# Patient Record
Sex: Female | Born: 1950 | Race: White | Hispanic: No | Marital: Married | Smoking: Never smoker
Health system: Southern US, Community
[De-identification: ages and names within clinical notes are randomized; demographics above are authoritative.]

## PROBLEM LIST (undated history)

## (undated) DIAGNOSIS — I509 Heart failure, unspecified: Secondary | ICD-10-CM

## (undated) DIAGNOSIS — I4891 Unspecified atrial fibrillation: Secondary | ICD-10-CM

## (undated) DIAGNOSIS — I872 Venous insufficiency (chronic) (peripheral): Secondary | ICD-10-CM

## (undated) DIAGNOSIS — M1990 Unspecified osteoarthritis, unspecified site: Secondary | ICD-10-CM

## (undated) DIAGNOSIS — K921 Melena: Secondary | ICD-10-CM

## (undated) DIAGNOSIS — I890 Lymphedema, not elsewhere classified: Secondary | ICD-10-CM

## (undated) DIAGNOSIS — G709 Myoneural disorder, unspecified: Secondary | ICD-10-CM

## (undated) DIAGNOSIS — D649 Anemia, unspecified: Secondary | ICD-10-CM

## (undated) DIAGNOSIS — K922 Gastrointestinal hemorrhage, unspecified: Secondary | ICD-10-CM

## (undated) DIAGNOSIS — E119 Type 2 diabetes mellitus without complications: Secondary | ICD-10-CM

## (undated) DIAGNOSIS — I10 Essential (primary) hypertension: Secondary | ICD-10-CM

## (undated) DIAGNOSIS — I499 Cardiac arrhythmia, unspecified: Secondary | ICD-10-CM

## (undated) DIAGNOSIS — G4730 Sleep apnea, unspecified: Secondary | ICD-10-CM

## (undated) HISTORY — PX: UMBILICAL HERNIA REPAIR: SHX196

## (undated) HISTORY — PX: TOTAL KNEE ARTHROPLASTY: SHX125

## (undated) HISTORY — PX: JOINT REPLACEMENT: SHX530

---

## 2022-03-18 ENCOUNTER — Inpatient Hospital Stay
Admission: RE | Admit: 2022-03-18 | Discharge: 2022-04-07 | Disposition: A | Discharge status: Rehabilitation Facility | Payer: Medicare Other

## 2022-03-18 HISTORY — DX: Lymphedema, not elsewhere classified: I89.0

## 2022-03-18 HISTORY — DX: Gastrointestinal hemorrhage, unspecified: K92.2

## 2022-03-18 HISTORY — DX: Unspecified atrial fibrillation: I48.91

## 2022-03-18 HISTORY — DX: Melena: K92.1

## 2022-03-18 HISTORY — DX: Venous insufficiency (chronic) (peripheral): I87.2

## 2022-03-18 HISTORY — DX: Morbid (severe) obesity due to excess calories: E66.01

## 2022-03-19 LAB — BASIC METABOLIC PANEL
Anion gap: 8 (ref 5–15)
BUN: 20 mg/dL (ref 8–23)
CO2: 43 mmol/L — ABNORMAL HIGH (ref 22–32)
Calcium: 9.4 mg/dL (ref 8.9–10.3)
Chloride: 82 mmol/L — ABNORMAL LOW (ref 98–111)
Creatinine, Ser: 1.04 mg/dL — ABNORMAL HIGH (ref 0.44–1.00)
GFR, Estimated: 57 mL/min — ABNORMAL LOW (ref >60)
Glucose, Bld: 95 mg/dL (ref 70–99)
Potassium: 3.6 mmol/L (ref 3.5–5.1)
Sodium: 133 mmol/L — ABNORMAL LOW (ref 135–145)

## 2022-03-19 LAB — CBC WITH DIFFERENTIAL/PLATELET
Abs Immature Granulocytes: 0.06 10*3/uL (ref 0.00–0.07)
Basophils Absolute: 0.1 10*3/uL (ref 0.0–0.1)
Basophils Relative: 1 %
Eosinophils Absolute: 0.3 10*3/uL (ref 0.0–0.5)
Eosinophils Relative: 2 %
HCT: 25.3 % — ABNORMAL LOW (ref 36.0–46.0)
Hemoglobin: 7.7 g/dL — ABNORMAL LOW (ref 12.0–15.0)
Immature Granulocytes: 1 %
Lymphocytes Relative: 10 %
Lymphs Abs: 1.1 10*3/uL (ref 0.7–4.0)
MCH: 26.1 pg (ref 26.0–34.0)
MCHC: 30.4 g/dL (ref 30.0–36.0)
MCV: 85.8 fL (ref 80.0–100.0)
Monocytes Absolute: 1 10*3/uL (ref 0.1–1.0)
Monocytes Relative: 9 %
Neutro Abs: 8.2 10*3/uL — ABNORMAL HIGH (ref 1.7–7.7)
Neutrophils Relative %: 77 %
Platelets: 450 10*3/uL — ABNORMAL HIGH (ref 150–400)
RBC: 2.95 MIL/uL — ABNORMAL LOW (ref 3.87–5.11)
RDW: 16.7 % — ABNORMAL HIGH (ref 11.5–15.5)
WBC: 10.6 10*3/uL — ABNORMAL HIGH (ref 4.0–10.5)
nRBC: 0 % (ref 0.0–0.2)

## 2022-03-19 LAB — LIPID PANEL
Cholesterol: 110 mg/dL (ref 0–200)
HDL: 28 mg/dL — ABNORMAL LOW (ref >40)
LDL Cholesterol: 67 mg/dL (ref 0–99)
Total CHOL/HDL Ratio: 3.9 RATIO
Triglycerides: 75 mg/dL (ref <150)
VLDL: 15 mg/dL (ref 0–40)

## 2022-03-19 LAB — HEMOGLOBIN A1C
Hgb A1c MFr Bld: 5.4 % (ref 4.8–5.6)
Mean Plasma Glucose: 108.28 mg/dL

## 2022-03-19 LAB — CK: Total CK: 540 U/L — ABNORMAL HIGH (ref 38–234)

## 2022-03-19 LAB — MAGNESIUM: Magnesium: 1.9 mg/dL (ref 1.7–2.4)

## 2022-03-19 LAB — TSH: TSH: 4.495 u[IU]/mL (ref 0.350–4.500)

## 2022-03-20 LAB — CBC WITH DIFFERENTIAL/PLATELET
Abs Immature Granulocytes: 0.06 10*3/uL (ref 0.00–0.07)
Basophils Absolute: 0.1 10*3/uL (ref 0.0–0.1)
Basophils Relative: 1 %
Eosinophils Absolute: 0.3 10*3/uL (ref 0.0–0.5)
Eosinophils Relative: 3 %
HCT: 23.1 % — ABNORMAL LOW (ref 36.0–46.0)
Hemoglobin: 7.1 g/dL — ABNORMAL LOW (ref 12.0–15.0)
Immature Granulocytes: 1 %
Lymphocytes Relative: 12 %
Lymphs Abs: 1.2 10*3/uL (ref 0.7–4.0)
MCH: 26.9 pg (ref 26.0–34.0)
MCHC: 30.7 g/dL (ref 30.0–36.0)
MCV: 87.5 fL (ref 80.0–100.0)
Monocytes Absolute: 1 10*3/uL (ref 0.1–1.0)
Monocytes Relative: 9 %
Neutro Abs: 7.5 10*3/uL (ref 1.7–7.7)
Neutrophils Relative %: 74 %
Platelets: 407 10*3/uL — ABNORMAL HIGH (ref 150–400)
RBC: 2.64 MIL/uL — ABNORMAL LOW (ref 3.87–5.11)
RDW: 16.9 % — ABNORMAL HIGH (ref 11.5–15.5)
WBC: 10.2 10*3/uL (ref 4.0–10.5)
nRBC: 0 % (ref 0.0–0.2)

## 2022-03-20 LAB — BASIC METABOLIC PANEL
Anion gap: 6 (ref 5–15)
BUN: 28 mg/dL — ABNORMAL HIGH (ref 8–23)
CO2: 43 mmol/L — ABNORMAL HIGH (ref 22–32)
Calcium: 9.1 mg/dL (ref 8.9–10.3)
Chloride: 87 mmol/L — ABNORMAL LOW (ref 98–111)
Creatinine, Ser: 1.13 mg/dL — ABNORMAL HIGH (ref 0.44–1.00)
GFR, Estimated: 51 mL/min — ABNORMAL LOW (ref >60)
Glucose, Bld: 102 mg/dL — ABNORMAL HIGH (ref 70–99)
Potassium: 4 mmol/L (ref 3.5–5.1)
Sodium: 136 mmol/L (ref 135–145)

## 2022-03-20 LAB — HEMOGLOBIN AND HEMATOCRIT, BLOOD
HCT: 27.9 % — ABNORMAL LOW (ref 36.0–46.0)
Hemoglobin: 9 g/dL — ABNORMAL LOW (ref 12.0–15.0)

## 2022-03-20 LAB — PREPARE RBC (CROSSMATCH)

## 2022-03-20 LAB — MAGNESIUM: Magnesium: 2 mg/dL (ref 1.7–2.4)

## 2022-03-20 LAB — ABO/RH: ABO/RH(D): A POS

## 2022-03-21 LAB — CBC WITH DIFFERENTIAL/PLATELET
Abs Immature Granulocytes: 0.04 10*3/uL (ref 0.00–0.07)
Basophils Absolute: 0.1 10*3/uL (ref 0.0–0.1)
Basophils Relative: 1 %
Eosinophils Absolute: 0.2 10*3/uL (ref 0.0–0.5)
Eosinophils Relative: 3 %
HCT: 25.9 % — ABNORMAL LOW (ref 36.0–46.0)
Hemoglobin: 7.9 g/dL — ABNORMAL LOW (ref 12.0–15.0)
Immature Granulocytes: 0 %
Lymphocytes Relative: 11 %
Lymphs Abs: 1 10*3/uL (ref 0.7–4.0)
MCH: 26.6 pg (ref 26.0–34.0)
MCHC: 30.5 g/dL (ref 30.0–36.0)
MCV: 87.2 fL (ref 80.0–100.0)
Monocytes Absolute: 0.8 10*3/uL (ref 0.1–1.0)
Monocytes Relative: 8 %
Neutro Abs: 7.3 10*3/uL (ref 1.7–7.7)
Neutrophils Relative %: 77 %
Platelets: 412 10*3/uL — ABNORMAL HIGH (ref 150–400)
RBC: 2.97 MIL/uL — ABNORMAL LOW (ref 3.87–5.11)
RDW: 17.1 % — ABNORMAL HIGH (ref 11.5–15.5)
WBC: 9.4 10*3/uL (ref 4.0–10.5)
nRBC: 0 % (ref 0.0–0.2)

## 2022-03-21 LAB — BASIC METABOLIC PANEL
Anion gap: 9 (ref 5–15)
BUN: 25 mg/dL — ABNORMAL HIGH (ref 8–23)
CO2: 44 mmol/L — ABNORMAL HIGH (ref 22–32)
Calcium: 9.3 mg/dL (ref 8.9–10.3)
Chloride: 85 mmol/L — ABNORMAL LOW (ref 98–111)
Creatinine, Ser: 1.11 mg/dL — ABNORMAL HIGH (ref 0.44–1.00)
GFR, Estimated: 52 mL/min — ABNORMAL LOW (ref >60)
Glucose, Bld: 99 mg/dL (ref 70–99)
Potassium: 3.7 mmol/L (ref 3.5–5.1)
Sodium: 138 mmol/L (ref 135–145)

## 2022-03-21 LAB — TYPE AND SCREEN
ABO/RH(D): A POS
Antibody Screen: NEGATIVE
Unit division: 0

## 2022-03-21 LAB — BPAM RBC
Blood Product Expiration Date: 202305022359
ISSUE DATE / TIME: 202304011700
Unit Type and Rh: 6200

## 2022-03-21 LAB — MAGNESIUM: Magnesium: 2 mg/dL (ref 1.7–2.4)

## 2022-03-22 LAB — BASIC METABOLIC PANEL
BUN: 30 mg/dL — ABNORMAL HIGH (ref 8–23)
CO2: >45 mmol/L — ABNORMAL HIGH (ref 22–32)
Calcium: 9.3 mg/dL (ref 8.9–10.3)
Chloride: 87 mmol/L — ABNORMAL LOW (ref 98–111)
Creatinine, Ser: 1.43 mg/dL — ABNORMAL HIGH (ref 0.44–1.00)
GFR, Estimated: 39 mL/min — ABNORMAL LOW (ref >60)
Glucose, Bld: 96 mg/dL (ref 70–99)
Potassium: 4.2 mmol/L (ref 3.5–5.1)
Sodium: 139 mmol/L (ref 135–145)

## 2022-03-22 LAB — CBC WITH DIFFERENTIAL/PLATELET
Abs Immature Granulocytes: 0.05 10*3/uL (ref 0.00–0.07)
Basophils Absolute: 0.1 10*3/uL (ref 0.0–0.1)
Basophils Relative: 1 %
Eosinophils Absolute: 0.4 10*3/uL (ref 0.0–0.5)
Eosinophils Relative: 4 %
HCT: 28 % — ABNORMAL LOW (ref 36.0–46.0)
Hemoglobin: 8.3 g/dL — ABNORMAL LOW (ref 12.0–15.0)
Immature Granulocytes: 1 %
Lymphocytes Relative: 16 %
Lymphs Abs: 1.5 10*3/uL (ref 0.7–4.0)
MCH: 26.3 pg (ref 26.0–34.0)
MCHC: 29.6 g/dL — ABNORMAL LOW (ref 30.0–36.0)
MCV: 88.6 fL (ref 80.0–100.0)
Monocytes Absolute: 0.9 10*3/uL (ref 0.1–1.0)
Monocytes Relative: 10 %
Neutro Abs: 6.1 10*3/uL (ref 1.7–7.7)
Neutrophils Relative %: 68 %
Platelets: 382 10*3/uL (ref 150–400)
RBC: 3.16 MIL/uL — ABNORMAL LOW (ref 3.87–5.11)
RDW: 16.7 % — ABNORMAL HIGH (ref 11.5–15.5)
WBC: 9 10*3/uL (ref 4.0–10.5)
nRBC: 0 % (ref 0.0–0.2)

## 2022-03-22 LAB — MAGNESIUM: Magnesium: 2.2 mg/dL (ref 1.7–2.4)

## 2022-03-23 LAB — CBC WITH DIFFERENTIAL/PLATELET
Abs Immature Granulocytes: 0.05 10*3/uL (ref 0.00–0.07)
Basophils Absolute: 0.1 10*3/uL (ref 0.0–0.1)
Basophils Relative: 1 %
Eosinophils Absolute: 0.3 10*3/uL (ref 0.0–0.5)
Eosinophils Relative: 3 %
HCT: 27.2 % — ABNORMAL LOW (ref 36.0–46.0)
Hemoglobin: 7.9 g/dL — ABNORMAL LOW (ref 12.0–15.0)
Immature Granulocytes: 1 %
Lymphocytes Relative: 11 %
Lymphs Abs: 1.1 10*3/uL (ref 0.7–4.0)
MCH: 25.9 pg — ABNORMAL LOW (ref 26.0–34.0)
MCHC: 29 g/dL — ABNORMAL LOW (ref 30.0–36.0)
MCV: 89.2 fL (ref 80.0–100.0)
Monocytes Absolute: 0.9 10*3/uL (ref 0.1–1.0)
Monocytes Relative: 8 %
Neutro Abs: 8.1 10*3/uL — ABNORMAL HIGH (ref 1.7–7.7)
Neutrophils Relative %: 76 %
Platelets: 350 10*3/uL (ref 150–400)
RBC: 3.05 MIL/uL — ABNORMAL LOW (ref 3.87–5.11)
RDW: 16.8 % — ABNORMAL HIGH (ref 11.5–15.5)
WBC: 10.5 10*3/uL (ref 4.0–10.5)
nRBC: 0 % (ref 0.0–0.2)

## 2022-03-23 LAB — MAGNESIUM: Magnesium: 2 mg/dL (ref 1.7–2.4)

## 2022-03-23 LAB — BASIC METABOLIC PANEL
Anion gap: 8 (ref 5–15)
BUN: 37 mg/dL — ABNORMAL HIGH (ref 8–23)
CO2: 45 mmol/L — ABNORMAL HIGH (ref 22–32)
Calcium: 9.2 mg/dL (ref 8.9–10.3)
Chloride: 90 mmol/L — ABNORMAL LOW (ref 98–111)
Creatinine, Ser: 1.28 mg/dL — ABNORMAL HIGH (ref 0.44–1.00)
GFR, Estimated: 44 mL/min — ABNORMAL LOW (ref >60)
Glucose, Bld: 132 mg/dL — ABNORMAL HIGH (ref 70–99)
Potassium: 3.6 mmol/L (ref 3.5–5.1)
Sodium: 143 mmol/L (ref 135–145)

## 2022-03-24 LAB — CBC WITH DIFFERENTIAL/PLATELET
Abs Immature Granulocytes: 0.03 10*3/uL (ref 0.00–0.07)
Basophils Absolute: 0.1 10*3/uL (ref 0.0–0.1)
Basophils Relative: 1 %
Eosinophils Absolute: 0.3 10*3/uL (ref 0.0–0.5)
Eosinophils Relative: 3 %
HCT: 27.6 % — ABNORMAL LOW (ref 36.0–46.0)
Hemoglobin: 7.9 g/dL — ABNORMAL LOW (ref 12.0–15.0)
Immature Granulocytes: 0 %
Lymphocytes Relative: 11 %
Lymphs Abs: 1.1 10*3/uL (ref 0.7–4.0)
MCH: 25.6 pg — ABNORMAL LOW (ref 26.0–34.0)
MCHC: 28.6 g/dL — ABNORMAL LOW (ref 30.0–36.0)
MCV: 89.6 fL (ref 80.0–100.0)
Monocytes Absolute: 0.7 10*3/uL (ref 0.1–1.0)
Monocytes Relative: 8 %
Neutro Abs: 7.6 10*3/uL (ref 1.7–7.7)
Neutrophils Relative %: 77 %
Platelets: 330 10*3/uL (ref 150–400)
RBC: 3.08 MIL/uL — ABNORMAL LOW (ref 3.87–5.11)
RDW: 16.8 % — ABNORMAL HIGH (ref 11.5–15.5)
WBC: 9.9 10*3/uL (ref 4.0–10.5)
nRBC: 0 % (ref 0.0–0.2)

## 2022-03-24 LAB — BASIC METABOLIC PANEL
Anion gap: 5 (ref 5–15)
BUN: 35 mg/dL — ABNORMAL HIGH (ref 8–23)
CO2: 43 mmol/L — ABNORMAL HIGH (ref 22–32)
Calcium: 9.3 mg/dL (ref 8.9–10.3)
Chloride: 93 mmol/L — ABNORMAL LOW (ref 98–111)
Creatinine, Ser: 1.13 mg/dL — ABNORMAL HIGH (ref 0.44–1.00)
GFR, Estimated: 51 mL/min — ABNORMAL LOW (ref >60)
Glucose, Bld: 102 mg/dL — ABNORMAL HIGH (ref 70–99)
Potassium: 3.9 mmol/L (ref 3.5–5.1)
Sodium: 141 mmol/L (ref 135–145)

## 2022-03-24 LAB — MAGNESIUM: Magnesium: 2.2 mg/dL (ref 1.7–2.4)

## 2022-03-25 LAB — CBC WITH DIFFERENTIAL/PLATELET
Abs Immature Granulocytes: 0.02 10*3/uL (ref 0.00–0.07)
Basophils Absolute: 0.1 10*3/uL (ref 0.0–0.1)
Basophils Relative: 1 %
Eosinophils Absolute: 0.5 10*3/uL (ref 0.0–0.5)
Eosinophils Relative: 6 %
HCT: 27.6 % — ABNORMAL LOW (ref 36.0–46.0)
Hemoglobin: 8.1 g/dL — ABNORMAL LOW (ref 12.0–15.0)
Immature Granulocytes: 0 %
Lymphocytes Relative: 12 %
Lymphs Abs: 1.1 10*3/uL (ref 0.7–4.0)
MCH: 26.2 pg (ref 26.0–34.0)
MCHC: 29.3 g/dL — ABNORMAL LOW (ref 30.0–36.0)
MCV: 89.3 fL (ref 80.0–100.0)
Monocytes Absolute: 0.6 10*3/uL (ref 0.1–1.0)
Monocytes Relative: 7 %
Neutro Abs: 6.7 10*3/uL (ref 1.7–7.7)
Neutrophils Relative %: 74 %
Platelets: 348 10*3/uL (ref 150–400)
RBC: 3.09 MIL/uL — ABNORMAL LOW (ref 3.87–5.11)
RDW: 16.8 % — ABNORMAL HIGH (ref 11.5–15.5)
WBC: 9.1 10*3/uL (ref 4.0–10.5)
nRBC: 0 % (ref 0.0–0.2)

## 2022-03-25 LAB — BASIC METABOLIC PANEL
Anion gap: 9 (ref 5–15)
BUN: 33 mg/dL — ABNORMAL HIGH (ref 8–23)
CO2: 38 mmol/L — ABNORMAL HIGH (ref 22–32)
Calcium: 9 mg/dL (ref 8.9–10.3)
Chloride: 93 mmol/L — ABNORMAL LOW (ref 98–111)
Creatinine, Ser: 1.07 mg/dL — ABNORMAL HIGH (ref 0.44–1.00)
GFR, Estimated: 55 mL/min — ABNORMAL LOW (ref >60)
Glucose, Bld: 99 mg/dL (ref 70–99)
Potassium: 3.8 mmol/L (ref 3.5–5.1)
Sodium: 140 mmol/L (ref 135–145)

## 2022-03-25 LAB — MAGNESIUM: Magnesium: 2.3 mg/dL (ref 1.7–2.4)

## 2022-03-26 LAB — CBC WITH DIFFERENTIAL/PLATELET
Abs Immature Granulocytes: 0.05 10*3/uL (ref 0.00–0.07)
Basophils Absolute: 0.1 10*3/uL (ref 0.0–0.1)
Basophils Relative: 1 %
Eosinophils Absolute: 0.5 10*3/uL (ref 0.0–0.5)
Eosinophils Relative: 5 %
HCT: 27.9 % — ABNORMAL LOW (ref 36.0–46.0)
Hemoglobin: 8.1 g/dL — ABNORMAL LOW (ref 12.0–15.0)
Immature Granulocytes: 1 %
Lymphocytes Relative: 12 %
Lymphs Abs: 1.1 10*3/uL (ref 0.7–4.0)
MCH: 26 pg (ref 26.0–34.0)
MCHC: 29 g/dL — ABNORMAL LOW (ref 30.0–36.0)
MCV: 89.4 fL (ref 80.0–100.0)
Monocytes Absolute: 0.7 10*3/uL (ref 0.1–1.0)
Monocytes Relative: 7 %
Neutro Abs: 6.6 10*3/uL (ref 1.7–7.7)
Neutrophils Relative %: 74 %
Platelets: 359 10*3/uL (ref 150–400)
RBC: 3.12 MIL/uL — ABNORMAL LOW (ref 3.87–5.11)
RDW: 16.9 % — ABNORMAL HIGH (ref 11.5–15.5)
WBC: 9 10*3/uL (ref 4.0–10.5)
nRBC: 0 % (ref 0.0–0.2)

## 2022-03-26 LAB — BASIC METABOLIC PANEL
Anion gap: 5 (ref 5–15)
BUN: 37 mg/dL — ABNORMAL HIGH (ref 8–23)
CO2: 40 mmol/L — ABNORMAL HIGH (ref 22–32)
Calcium: 9.1 mg/dL (ref 8.9–10.3)
Chloride: 98 mmol/L (ref 98–111)
Creatinine, Ser: 1.15 mg/dL — ABNORMAL HIGH (ref 0.44–1.00)
GFR, Estimated: 50 mL/min — ABNORMAL LOW (ref >60)
Glucose, Bld: 99 mg/dL (ref 70–99)
Potassium: 3.7 mmol/L (ref 3.5–5.1)
Sodium: 143 mmol/L (ref 135–145)

## 2022-03-26 LAB — MAGNESIUM: Magnesium: 2.3 mg/dL (ref 1.7–2.4)

## 2022-03-27 LAB — CBC WITH DIFFERENTIAL/PLATELET
Abs Immature Granulocytes: 0.05 10*3/uL (ref 0.00–0.07)
Basophils Absolute: 0.1 10*3/uL (ref 0.0–0.1)
Basophils Relative: 2 %
Eosinophils Absolute: 0.6 10*3/uL — ABNORMAL HIGH (ref 0.0–0.5)
Eosinophils Relative: 7 %
HCT: 27.3 % — ABNORMAL LOW (ref 36.0–46.0)
Hemoglobin: 7.9 g/dL — ABNORMAL LOW (ref 12.0–15.0)
Immature Granulocytes: 1 %
Lymphocytes Relative: 14 %
Lymphs Abs: 1.2 10*3/uL (ref 0.7–4.0)
MCH: 26.2 pg (ref 26.0–34.0)
MCHC: 28.9 g/dL — ABNORMAL LOW (ref 30.0–36.0)
MCV: 90.7 fL (ref 80.0–100.0)
Monocytes Absolute: 0.7 10*3/uL (ref 0.1–1.0)
Monocytes Relative: 8 %
Neutro Abs: 5.7 10*3/uL (ref 1.7–7.7)
Neutrophils Relative %: 68 %
Platelets: 348 10*3/uL (ref 150–400)
RBC: 3.01 MIL/uL — ABNORMAL LOW (ref 3.87–5.11)
RDW: 17.1 % — ABNORMAL HIGH (ref 11.5–15.5)
WBC: 8.3 10*3/uL (ref 4.0–10.5)
nRBC: 0 % (ref 0.0–0.2)

## 2022-03-27 LAB — BASIC METABOLIC PANEL
Anion gap: 5 (ref 5–15)
BUN: 35 mg/dL — ABNORMAL HIGH (ref 8–23)
CO2: 37 mmol/L — ABNORMAL HIGH (ref 22–32)
Calcium: 8.8 mg/dL — ABNORMAL LOW (ref 8.9–10.3)
Chloride: 100 mmol/L (ref 98–111)
Creatinine, Ser: 1.12 mg/dL — ABNORMAL HIGH (ref 0.44–1.00)
GFR, Estimated: 52 mL/min — ABNORMAL LOW (ref >60)
Glucose, Bld: 95 mg/dL (ref 70–99)
Potassium: 3.7 mmol/L (ref 3.5–5.1)
Sodium: 142 mmol/L (ref 135–145)

## 2022-03-27 LAB — MAGNESIUM: Magnesium: 2.4 mg/dL (ref 1.7–2.4)

## 2022-03-28 LAB — CBC WITH DIFFERENTIAL/PLATELET
Abs Immature Granulocytes: 0.06 10*3/uL (ref 0.00–0.07)
Basophils Absolute: 0.1 10*3/uL (ref 0.0–0.1)
Basophils Relative: 1 %
Eosinophils Absolute: 0.4 10*3/uL (ref 0.0–0.5)
Eosinophils Relative: 4 %
HCT: 27.3 % — ABNORMAL LOW (ref 36.0–46.0)
Hemoglobin: 8.2 g/dL — ABNORMAL LOW (ref 12.0–15.0)
Immature Granulocytes: 1 %
Lymphocytes Relative: 10 %
Lymphs Abs: 1 10*3/uL (ref 0.7–4.0)
MCH: 26.8 pg (ref 26.0–34.0)
MCHC: 30 g/dL (ref 30.0–36.0)
MCV: 89.2 fL (ref 80.0–100.0)
Monocytes Absolute: 0.7 10*3/uL (ref 0.1–1.0)
Monocytes Relative: 7 %
Neutro Abs: 7.6 10*3/uL (ref 1.7–7.7)
Neutrophils Relative %: 77 %
Platelets: 358 10*3/uL (ref 150–400)
RBC: 3.06 MIL/uL — ABNORMAL LOW (ref 3.87–5.11)
RDW: 16.8 % — ABNORMAL HIGH (ref 11.5–15.5)
WBC: 9.8 10*3/uL (ref 4.0–10.5)
nRBC: 0 % (ref 0.0–0.2)

## 2022-03-28 LAB — BASIC METABOLIC PANEL
Anion gap: 7 (ref 5–15)
BUN: 32 mg/dL — ABNORMAL HIGH (ref 8–23)
CO2: 35 mmol/L — ABNORMAL HIGH (ref 22–32)
Calcium: 8.8 mg/dL — ABNORMAL LOW (ref 8.9–10.3)
Chloride: 98 mmol/L (ref 98–111)
Creatinine, Ser: 1.1 mg/dL — ABNORMAL HIGH (ref 0.44–1.00)
GFR, Estimated: 53 mL/min — ABNORMAL LOW (ref >60)
Glucose, Bld: 100 mg/dL — ABNORMAL HIGH (ref 70–99)
Potassium: 3.6 mmol/L (ref 3.5–5.1)
Sodium: 140 mmol/L (ref 135–145)

## 2022-03-28 LAB — TSH: TSH: 3.146 u[IU]/mL (ref 0.350–4.500)

## 2022-03-28 LAB — MAGNESIUM: Magnesium: 2.3 mg/dL (ref 1.7–2.4)

## 2022-03-29 LAB — CBC WITH DIFFERENTIAL/PLATELET
Abs Immature Granulocytes: 0.04 10*3/uL (ref 0.00–0.07)
Basophils Absolute: 0.1 10*3/uL (ref 0.0–0.1)
Basophils Relative: 1 %
Eosinophils Absolute: 0.4 10*3/uL (ref 0.0–0.5)
Eosinophils Relative: 4 %
HCT: 27.5 % — ABNORMAL LOW (ref 36.0–46.0)
Hemoglobin: 8 g/dL — ABNORMAL LOW (ref 12.0–15.0)
Immature Granulocytes: 0 %
Lymphocytes Relative: 10 %
Lymphs Abs: 0.9 10*3/uL (ref 0.7–4.0)
MCH: 26.3 pg (ref 26.0–34.0)
MCHC: 29.1 g/dL — ABNORMAL LOW (ref 30.0–36.0)
MCV: 90.5 fL (ref 80.0–100.0)
Monocytes Absolute: 0.6 10*3/uL (ref 0.1–1.0)
Monocytes Relative: 7 %
Neutro Abs: 7 10*3/uL (ref 1.7–7.7)
Neutrophils Relative %: 78 %
Platelets: 374 10*3/uL (ref 150–400)
RBC: 3.04 MIL/uL — ABNORMAL LOW (ref 3.87–5.11)
RDW: 16.9 % — ABNORMAL HIGH (ref 11.5–15.5)
WBC: 9 10*3/uL (ref 4.0–10.5)
nRBC: 0 % (ref 0.0–0.2)

## 2022-03-29 LAB — BASIC METABOLIC PANEL
Anion gap: 5 (ref 5–15)
BUN: 30 mg/dL — ABNORMAL HIGH (ref 8–23)
CO2: 36 mmol/L — ABNORMAL HIGH (ref 22–32)
Calcium: 9 mg/dL (ref 8.9–10.3)
Chloride: 100 mmol/L (ref 98–111)
Creatinine, Ser: 1.09 mg/dL — ABNORMAL HIGH (ref 0.44–1.00)
GFR, Estimated: 54 mL/min — ABNORMAL LOW (ref >60)
Glucose, Bld: 94 mg/dL (ref 70–99)
Potassium: 4.3 mmol/L (ref 3.5–5.1)
Sodium: 141 mmol/L (ref 135–145)

## 2022-03-29 LAB — MAGNESIUM: Magnesium: 2.4 mg/dL (ref 1.7–2.4)

## 2022-03-30 LAB — BASIC METABOLIC PANEL
Anion gap: 6 (ref 5–15)
BUN: 30 mg/dL — ABNORMAL HIGH (ref 8–23)
CO2: 36 mmol/L — ABNORMAL HIGH (ref 22–32)
Calcium: 8.9 mg/dL (ref 8.9–10.3)
Chloride: 98 mmol/L (ref 98–111)
Creatinine, Ser: 1.03 mg/dL — ABNORMAL HIGH (ref 0.44–1.00)
GFR, Estimated: 57 mL/min — ABNORMAL LOW (ref >60)
Glucose, Bld: 105 mg/dL — ABNORMAL HIGH (ref 70–99)
Potassium: 3.8 mmol/L (ref 3.5–5.1)
Sodium: 140 mmol/L (ref 135–145)

## 2022-03-30 LAB — CBC WITH DIFFERENTIAL/PLATELET
Abs Immature Granulocytes: 0.05 10*3/uL (ref 0.00–0.07)
Basophils Absolute: 0.1 10*3/uL (ref 0.0–0.1)
Basophils Relative: 1 %
Eosinophils Absolute: 0.4 10*3/uL (ref 0.0–0.5)
Eosinophils Relative: 5 %
HCT: 27.4 % — ABNORMAL LOW (ref 36.0–46.0)
Hemoglobin: 8.2 g/dL — ABNORMAL LOW (ref 12.0–15.0)
Immature Granulocytes: 1 %
Lymphocytes Relative: 12 %
Lymphs Abs: 1.1 10*3/uL (ref 0.7–4.0)
MCH: 26.8 pg (ref 26.0–34.0)
MCHC: 29.9 g/dL — ABNORMAL LOW (ref 30.0–36.0)
MCV: 89.5 fL (ref 80.0–100.0)
Monocytes Absolute: 0.7 10*3/uL (ref 0.1–1.0)
Monocytes Relative: 8 %
Neutro Abs: 6.6 10*3/uL (ref 1.7–7.7)
Neutrophils Relative %: 73 %
Platelets: 385 10*3/uL (ref 150–400)
RBC: 3.06 MIL/uL — ABNORMAL LOW (ref 3.87–5.11)
RDW: 16.6 % — ABNORMAL HIGH (ref 11.5–15.5)
WBC: 8.9 10*3/uL (ref 4.0–10.5)
nRBC: 0 % (ref 0.0–0.2)

## 2022-03-30 LAB — MAGNESIUM: Magnesium: 2.3 mg/dL (ref 1.7–2.4)

## 2022-03-31 LAB — CBC WITH DIFFERENTIAL/PLATELET
Abs Immature Granulocytes: 0.03 10*3/uL (ref 0.00–0.07)
Basophils Absolute: 0.1 10*3/uL (ref 0.0–0.1)
Basophils Relative: 1 %
Eosinophils Absolute: 0.4 10*3/uL (ref 0.0–0.5)
Eosinophils Relative: 5 %
HCT: 27.9 % — ABNORMAL LOW (ref 36.0–46.0)
Hemoglobin: 8.4 g/dL — ABNORMAL LOW (ref 12.0–15.0)
Immature Granulocytes: 0 %
Lymphocytes Relative: 12 %
Lymphs Abs: 1 10*3/uL (ref 0.7–4.0)
MCH: 26.7 pg (ref 26.0–34.0)
MCHC: 30.1 g/dL (ref 30.0–36.0)
MCV: 88.6 fL (ref 80.0–100.0)
Monocytes Absolute: 0.7 10*3/uL (ref 0.1–1.0)
Monocytes Relative: 9 %
Neutro Abs: 6.2 10*3/uL (ref 1.7–7.7)
Neutrophils Relative %: 73 %
Platelets: 399 10*3/uL (ref 150–400)
RBC: 3.15 MIL/uL — ABNORMAL LOW (ref 3.87–5.11)
RDW: 16.7 % — ABNORMAL HIGH (ref 11.5–15.5)
WBC: 8.5 10*3/uL (ref 4.0–10.5)
nRBC: 0 % (ref 0.0–0.2)

## 2022-03-31 LAB — BASIC METABOLIC PANEL
Anion gap: 6 (ref 5–15)
BUN: 25 mg/dL — ABNORMAL HIGH (ref 8–23)
CO2: 35 mmol/L — ABNORMAL HIGH (ref 22–32)
Calcium: 9 mg/dL (ref 8.9–10.3)
Chloride: 100 mmol/L (ref 98–111)
Creatinine, Ser: 1.03 mg/dL — ABNORMAL HIGH (ref 0.44–1.00)
GFR, Estimated: 57 mL/min — ABNORMAL LOW (ref >60)
Glucose, Bld: 100 mg/dL — ABNORMAL HIGH (ref 70–99)
Potassium: 3.9 mmol/L (ref 3.5–5.1)
Sodium: 141 mmol/L (ref 135–145)

## 2022-03-31 LAB — MAGNESIUM: Magnesium: 2.1 mg/dL (ref 1.7–2.4)

## 2022-04-01 LAB — CBC WITH DIFFERENTIAL/PLATELET
Abs Immature Granulocytes: 0.06 10*3/uL (ref 0.00–0.07)
Basophils Absolute: 0.1 10*3/uL (ref 0.0–0.1)
Basophils Relative: 1 %
Eosinophils Absolute: 0.5 10*3/uL (ref 0.0–0.5)
Eosinophils Relative: 5 %
HCT: 27 % — ABNORMAL LOW (ref 36.0–46.0)
Hemoglobin: 8.2 g/dL — ABNORMAL LOW (ref 12.0–15.0)
Immature Granulocytes: 1 %
Lymphocytes Relative: 14 %
Lymphs Abs: 1.2 10*3/uL (ref 0.7–4.0)
MCH: 26.5 pg (ref 26.0–34.0)
MCHC: 30.4 g/dL (ref 30.0–36.0)
MCV: 87.1 fL (ref 80.0–100.0)
Monocytes Absolute: 0.9 10*3/uL (ref 0.1–1.0)
Monocytes Relative: 10 %
Neutro Abs: 5.9 10*3/uL (ref 1.7–7.7)
Neutrophils Relative %: 69 %
Platelets: 396 10*3/uL (ref 150–400)
RBC: 3.1 MIL/uL — ABNORMAL LOW (ref 3.87–5.11)
RDW: 16.9 % — ABNORMAL HIGH (ref 11.5–15.5)
WBC: 8.6 10*3/uL (ref 4.0–10.5)
nRBC: 0 % (ref 0.0–0.2)

## 2022-04-01 LAB — BASIC METABOLIC PANEL
Anion gap: 8 (ref 5–15)
BUN: 22 mg/dL (ref 8–23)
CO2: 31 mmol/L (ref 22–32)
Calcium: 8.7 mg/dL — ABNORMAL LOW (ref 8.9–10.3)
Chloride: 101 mmol/L (ref 98–111)
Creatinine, Ser: 1.04 mg/dL — ABNORMAL HIGH (ref 0.44–1.00)
GFR, Estimated: 57 mL/min — ABNORMAL LOW (ref >60)
Glucose, Bld: 88 mg/dL (ref 70–99)
Potassium: 4.1 mmol/L (ref 3.5–5.1)
Sodium: 140 mmol/L (ref 135–145)

## 2022-04-01 LAB — MAGNESIUM: Magnesium: 2.2 mg/dL (ref 1.7–2.4)

## 2022-04-02 LAB — CBC WITH DIFFERENTIAL/PLATELET
Abs Immature Granulocytes: 0.05 10*3/uL (ref 0.00–0.07)
Basophils Absolute: 0.1 10*3/uL (ref 0.0–0.1)
Basophils Relative: 1 %
Eosinophils Absolute: 0.5 10*3/uL (ref 0.0–0.5)
Eosinophils Relative: 5 %
HCT: 27.3 % — ABNORMAL LOW (ref 36.0–46.0)
Hemoglobin: 8.3 g/dL — ABNORMAL LOW (ref 12.0–15.0)
Immature Granulocytes: 1 %
Lymphocytes Relative: 12 %
Lymphs Abs: 1.1 10*3/uL (ref 0.7–4.0)
MCH: 26.7 pg (ref 26.0–34.0)
MCHC: 30.4 g/dL (ref 30.0–36.0)
MCV: 87.8 fL (ref 80.0–100.0)
Monocytes Absolute: 0.8 10*3/uL (ref 0.1–1.0)
Monocytes Relative: 9 %
Neutro Abs: 6.4 10*3/uL (ref 1.7–7.7)
Neutrophils Relative %: 72 %
Platelets: 384 10*3/uL (ref 150–400)
RBC: 3.11 MIL/uL — ABNORMAL LOW (ref 3.87–5.11)
RDW: 16.8 % — ABNORMAL HIGH (ref 11.5–15.5)
WBC: 8.8 10*3/uL (ref 4.0–10.5)
nRBC: 0 % (ref 0.0–0.2)

## 2022-04-02 LAB — BASIC METABOLIC PANEL
Anion gap: 5 (ref 5–15)
BUN: 21 mg/dL (ref 8–23)
CO2: 34 mmol/L — ABNORMAL HIGH (ref 22–32)
Calcium: 8.6 mg/dL — ABNORMAL LOW (ref 8.9–10.3)
Chloride: 97 mmol/L — ABNORMAL LOW (ref 98–111)
Creatinine, Ser: 0.89 mg/dL (ref 0.44–1.00)
GFR, Estimated: >60 mL/min (ref >60)
Glucose, Bld: 107 mg/dL — ABNORMAL HIGH (ref 70–99)
Potassium: 3.4 mmol/L — ABNORMAL LOW (ref 3.5–5.1)
Sodium: 136 mmol/L (ref 135–145)

## 2022-04-02 LAB — MAGNESIUM: Magnesium: 2.2 mg/dL (ref 1.7–2.4)

## 2022-04-03 LAB — POTASSIUM: Potassium: 3.9 mmol/L (ref 3.5–5.1)

## 2022-04-05 LAB — BASIC METABOLIC PANEL
Anion gap: 7 (ref 5–15)
BUN: 19 mg/dL (ref 8–23)
CO2: 35 mmol/L — ABNORMAL HIGH (ref 22–32)
Calcium: 8.9 mg/dL (ref 8.9–10.3)
Chloride: 99 mmol/L (ref 98–111)
Creatinine, Ser: 1.08 mg/dL — ABNORMAL HIGH (ref 0.44–1.00)
GFR, Estimated: 54 mL/min — ABNORMAL LOW (ref >60)
Glucose, Bld: 92 mg/dL (ref 70–99)
Potassium: 4.1 mmol/L (ref 3.5–5.1)
Sodium: 141 mmol/L (ref 135–145)

## 2022-04-06 NOTE — PMR Pre-admission (Signed)
PMR Admission Coordinator Pre-Admission Assessment ?  ?Patient: Stacy Hanson is an 71 y.o., female ?MRN: [id number] ?DOB: 12-07-1951 ?Height: 5\' 3"  (160 cm) ?Weight: (!) 155.6 kg ?  ?Insurance Information ?HMO:     PPO:      PCP:      IPA:      80/20:      OTHER:  ?PRIMARY: Medicare A/B      Policy#: [id number]      Subscriber: pt ?CM Name:       Phone#:      Fax#:  ?Pre-Cert#: verified online      Employer:  ?Benefits:  Phone #:      Name:  ?Eff. Date: A/B 12/20/13     Deduct: $1600      Out of Pocket Max:       Life Max:  ?CIR: 100% (48 days remaining)     SNF: 20 full days ?Outpatient: 80%     Co-Ins: 20% ?Home Health: 100%      Co-Pay:  ?DME: 80%     Co-Ins: 20% ?Providers:  ?SECONDARY: [employer] of [place]      Policy#: [id number]     Phone#: [phone] ?  ?Financial Counselor:       Phone#:  ?  ?The ?Data Collection Information Summary? for patients in Inpatient Rehabilitation Facilities with attached ?Privacy Act [place] Records? was provided and verbally reviewed with: Patient and Family ?  ?Emergency Contact Information ?Contact Information   ?  ?  Name Relation Home Work Mobile  ?  [place] Spouse     [phone]  ?  ?   ?  ?  ?Current Medical History  ?Patient Admitting Diagnosis: debility 2/2 GI bleed and sepsis ?History of Present Illness: Pt is a 71 y/o female with PMH of Afib, HTN, CHF, lymphedema, and morbid obesity admitted to [facility] on 3/19 following a fall at home.  Pt had experienced increasing weakness, fatigue, and melanic stools for several weeks prior to admission.  Pt had been on Eliquis but had stopped on 3/14 due to melanic stools.  On day of admit to [facility] she fell in her bathroom and was wedged between the toilet and the wall.  EMS had to dislocate her shoulder.  Hemoglobin on presentation was 5.6.  She received 4 units PRBCs with no further melanic stools.  EGD was performed on 3/20 demonstrating 3 ulcers with 1 a flat pigmented spot versus visible  vessel, endo-clipped.  GI recommended PPI.  CT abd/pelvis negative for retroperitoneal hemorrhage.  Low hgb felt to be due to low volume so started on lasix drip.  Pt also with blood cultures positive for enterococcus bacteremia with leukocytosis so started on broad spectrum antibiotics on 3/24.  Echocardiogram on 3/24 showed no evidence of valvular disease.  Pt not able to tolerate lying flat for TEE to r/o endocarditis so infectious disease recommended treating x6 weeks with ampicillin and ceftriaxone for possible endocarditis.  Last day of abx is May 8.  WBC improved to 11.3 by 3/30.  Pt with bouts of severe pain in BUEs and BLEs x-rays negative.  Pt with episodes of hypoxemia on 3/22, cxr revealed trace pleural effusions.  Pt currently tolerating on CPAP at night and 2L O2 nasal cannula during the day.  Pt with multiple skin tears and blisters to LEs with wound care following.  [facility].  She remains on IV ampicillin and ceftriaxone  via IV.  Lasix transitioned to PO.  Therapy ongoing and pt was recommended for CIR to maximize independence prior to discharge home with spouse.  ?  ? ?Patient's medical record from [facility] has been reviewed by the rehabilitation admission coordinator and physician. ? ?Past Medical History  ?No past medical history on file. ? ?Has the patient had major surgery during 100 days prior to admission? No ? ?Family History   ?family history is not on file. ? ?Current Medications ?No current facility-administered medications for this encounter. ? ?Patients Current Diet: Diet reg/thin ? ?Precautions / Restrictions ?Precautions: Fall ?Weight Bearing Restrictions: No ?  ? ?Has the patient had 2 or more falls or a fall with injury in the past year? Yes ? ?Prior Activity Level ?Limited Community (1-2x/wk): pt was mod I with rollator prior to hospitalization, assist for LB dressing but otherwise mod I with ADLs  and IADLs, not driving ? ? ?Prior Functional Level ?Self Care: Did the patient need help bathing, dressing, using the toilet or eating? Needed some help ? ?Indoor Mobility: Did the patient need assistance with walking from room to room (with or without device)? Independent ? ?Stairs: Did the patient need assistance with internal or external stairs (with or without device)? Independent ? ?Functional Cognition: Did the patient need help planning regular tasks such as shopping or remembering to take medications? Independent ? ?Patient Information ?Are you of Hispanic, Latino/a,or Spanish origin?: A. No, not of Hispanic, Latino/a, or Spanish origin ?What is your race?: A. White ?Do you need or want an interpreter to communicate with a doctor or health care staff?: 0. No ? ?Patient's Response To:  ?Health Literacy and Transportation ?Is the patient able to respond to health literacy and transportation needs?: Yes ?Health Literacy - How often do you need to have someone help you when you read instructions, pamphlets, or other written material from your doctor or pharmacy?: Never ?In the past 12 months, has lack of transportation kept you from medical appointments or from getting medications?: No ?In the past 12 months, has lack of transportation kept you from meetings, work, or from getting things needed for daily living?: No ? ?Home Assistive Devices / Equipment ?Walker (specify type) (rollator) ? ? ?Prior Device Use: Indicate devices/aids used by the patient prior to current illness, exacerbation or injury?  rollator ? ? Prior Functional Level Current Functional Level  ?Bed Mobility ? -- (sleeps in a recliner) ? Max assist ?  ?Transfers ? Mod Independent ? Max assist ?  ?Mobility - Walk/Wheelchair ? Mod Independent ?   ?  ?Upper Body Dressing ? Mod Independent ? Total assist ?  ?Lower Body Dressing ? Mod assist ? Total assist ?  ?Grooming ? Mod Independent ? Total assist ?  ?Eating/Drinking ? Mod Independent ? Total  assist ?  ?Toilet Transfer ? Mod Independent ? Max assist ?  ?Bladder Continence  ? continent ? incontinent ?  ?Bowel Management ? continent ? incontinent ?  ?Stair Climbing ? Mod Independent ?   ?  ?Communication ? Indep ? indep ?  ?Memory ? Indep ? indep ?  ?  ?Special Needs/ Care Considerations ?CPAP, Continuous Drip IV  ampicillin and ceftrixone, Skin multiple skin tears to BLEs ant/post, sacrococcygeal stage 1, excoriation to pannus, and Special service needs bariatric DME ? ?Previous Home Environment (from acute therapy documentation) ?Living Arrangements: Spouse/significant other ? Lives With: Spouse ?Available Help at Discharge: Family; Available 24 hours/day ?Type of Home: House ?Home Layout:  One level ?Home Access: Level entry ?Bathroom Shower/Tub: Other (comment) (walk in tub) ?Bathroom Toilet: Standard ?Bathroom Accessibility: No ?Home Care Services: No ? ? ?Discharge Living Setting ?Plans for Discharge Living Setting: Patient's home; Lives with (comment) (spouse) ?Type of Home at Discharge: House ?Discharge Home Layout: One level ?Discharge Home Access: Level entry ?Discharge Bathroom Shower/Tub: Other (comment) (walk in tub) ?Discharge Bathroom Toilet: Standard ?Discharge Bathroom Accessibility: No ?Does the patient have any problems obtaining your medications?: No ? ? ?Social/Family/Support Systems ?Patient Roles: Spouse ?Anticipated Caregiver: spouse, Alphia Moh ?Anticipated Caregiver's Contact Information: [phone] ?Ability/Limitations of Caregiver: min guard mobility, max assist for ADLs ?Caregiver Availability: 24/7 ?Discharge Plan Discussed with Primary Caregiver: Yes ?Is Caregiver In Agreement with Plan?: Yes ?Does Caregiver/Family have Issues with Lodging/Transportation while Pt is in Rehab?: No ? ? ?Goals ?Patient/Family Goal for Rehab: PT min assist transfers and short household distances; OT mod/max for ADLs; SLP n/a ?Expected length of stay: 14-16 days ?Additional Information: Pt  with ?neuro versus muskuloskeletal injury to [place] with extracation from bathroom causing severe UE mobility restrictions ?Pt/Family Agrees to Admission and willing to participate: Yes ?Program Orientation Pro

## 2022-04-07 ENCOUNTER — Inpatient Hospital Stay (HOSPITAL_COMMUNITY)
Admission: RE | Admit: 2022-04-07 | Discharge: 2022-05-06 | DRG: 945 | Disposition: A | Discharge status: Home Health | Payer: Medicare Other | Source: Other Acute Inpatient Hospital | Attending: Physical Medicine and Rehabilitation | Admitting: Physical Medicine and Rehabilitation

## 2022-04-07 ENCOUNTER — Other Ambulatory Visit: Payer: Self-pay

## 2022-04-07 ENCOUNTER — Inpatient Hospital Stay (HOSPITAL_COMMUNITY): Payer: Medicare Other

## 2022-04-07 ENCOUNTER — Encounter (HOSPITAL_COMMUNITY): Payer: Self-pay | Admitting: Physical Medicine and Rehabilitation

## 2022-04-07 DIAGNOSIS — G2581 Restless legs syndrome: Secondary | ICD-10-CM | POA: Diagnosis present

## 2022-04-07 DIAGNOSIS — Z6841 Body Mass Index (BMI) 40.0 and over, adult: Secondary | ICD-10-CM

## 2022-04-07 DIAGNOSIS — R5381 Other malaise: Principal | ICD-10-CM

## 2022-04-07 DIAGNOSIS — Z833 Family history of diabetes mellitus: Secondary | ICD-10-CM

## 2022-04-07 DIAGNOSIS — N179 Acute kidney failure, unspecified: Secondary | ICD-10-CM | POA: Diagnosis not present

## 2022-04-07 DIAGNOSIS — I509 Heart failure, unspecified: Secondary | ICD-10-CM | POA: Diagnosis not present

## 2022-04-07 DIAGNOSIS — G629 Polyneuropathy, unspecified: Secondary | ICD-10-CM | POA: Diagnosis present

## 2022-04-07 DIAGNOSIS — M791 Myalgia, unspecified site: Secondary | ICD-10-CM

## 2022-04-07 DIAGNOSIS — Z9981 Dependence on supplemental oxygen: Secondary | ICD-10-CM | POA: Diagnosis not present

## 2022-04-07 DIAGNOSIS — E662 Morbid (severe) obesity with alveolar hypoventilation: Secondary | ICD-10-CM | POA: Diagnosis present

## 2022-04-07 DIAGNOSIS — I83028 Varicose veins of left lower extremity with ulcer other part of lower leg: Secondary | ICD-10-CM | POA: Diagnosis present

## 2022-04-07 DIAGNOSIS — Z9989 Dependence on other enabling machines and devices: Secondary | ICD-10-CM | POA: Diagnosis not present

## 2022-04-07 DIAGNOSIS — I4891 Unspecified atrial fibrillation: Secondary | ICD-10-CM | POA: Diagnosis not present

## 2022-04-07 DIAGNOSIS — I482 Chronic atrial fibrillation, unspecified: Secondary | ICD-10-CM | POA: Diagnosis present

## 2022-04-07 DIAGNOSIS — I83018 Varicose veins of right lower extremity with ulcer other part of lower leg: Secondary | ICD-10-CM | POA: Diagnosis present

## 2022-04-07 DIAGNOSIS — I872 Venous insufficiency (chronic) (peripheral): Secondary | ICD-10-CM | POA: Diagnosis present

## 2022-04-07 DIAGNOSIS — R7989 Other specified abnormal findings of blood chemistry: Secondary | ICD-10-CM | POA: Diagnosis not present

## 2022-04-07 DIAGNOSIS — R7881 Bacteremia: Secondary | ICD-10-CM | POA: Diagnosis present

## 2022-04-07 DIAGNOSIS — R32 Unspecified urinary incontinence: Secondary | ICD-10-CM | POA: Diagnosis present

## 2022-04-07 DIAGNOSIS — I5032 Chronic diastolic (congestive) heart failure: Secondary | ICD-10-CM | POA: Diagnosis present

## 2022-04-07 DIAGNOSIS — G4733 Obstructive sleep apnea (adult) (pediatric): Secondary | ICD-10-CM | POA: Diagnosis not present

## 2022-04-07 DIAGNOSIS — Z8249 Family history of ischemic heart disease and other diseases of the circulatory system: Secondary | ICD-10-CM | POA: Diagnosis not present

## 2022-04-07 DIAGNOSIS — D62 Acute posthemorrhagic anemia: Secondary | ICD-10-CM | POA: Diagnosis present

## 2022-04-07 DIAGNOSIS — B952 Enterococcus as the cause of diseases classified elsewhere: Secondary | ICD-10-CM | POA: Diagnosis present

## 2022-04-07 DIAGNOSIS — E611 Iron deficiency: Secondary | ICD-10-CM | POA: Diagnosis present

## 2022-04-07 DIAGNOSIS — M25512 Pain in left shoulder: Secondary | ICD-10-CM | POA: Diagnosis present

## 2022-04-07 DIAGNOSIS — M79609 Pain in unspecified limb: Secondary | ICD-10-CM | POA: Diagnosis not present

## 2022-04-07 DIAGNOSIS — D649 Anemia, unspecified: Secondary | ICD-10-CM

## 2022-04-07 DIAGNOSIS — M5432 Sciatica, left side: Secondary | ICD-10-CM | POA: Diagnosis present

## 2022-04-07 DIAGNOSIS — M79606 Pain in leg, unspecified: Secondary | ICD-10-CM | POA: Diagnosis not present

## 2022-04-07 DIAGNOSIS — R159 Full incontinence of feces: Secondary | ICD-10-CM | POA: Diagnosis present

## 2022-04-07 DIAGNOSIS — M79669 Pain in unspecified lower leg: Secondary | ICD-10-CM | POA: Diagnosis not present

## 2022-04-07 DIAGNOSIS — Z79899 Other long term (current) drug therapy: Secondary | ICD-10-CM | POA: Diagnosis not present

## 2022-04-07 DIAGNOSIS — R609 Edema, unspecified: Secondary | ICD-10-CM | POA: Diagnosis not present

## 2022-04-07 DIAGNOSIS — G8929 Other chronic pain: Secondary | ICD-10-CM | POA: Diagnosis present

## 2022-04-07 DIAGNOSIS — I110 Hypertensive heart disease with heart failure: Secondary | ICD-10-CM | POA: Diagnosis present

## 2022-04-07 DIAGNOSIS — I959 Hypotension, unspecified: Secondary | ICD-10-CM | POA: Diagnosis not present

## 2022-04-07 HISTORY — DX: Heart failure, unspecified: I50.9

## 2022-04-07 HISTORY — DX: Myoneural disorder, unspecified: G70.9

## 2022-04-07 HISTORY — DX: Type 2 diabetes mellitus without complications: E11.9

## 2022-04-07 HISTORY — DX: Essential (primary) hypertension: I10

## 2022-04-07 HISTORY — DX: Unspecified osteoarthritis, unspecified site: M19.90

## 2022-04-07 HISTORY — DX: Cardiac arrhythmia, unspecified: I49.9

## 2022-04-07 HISTORY — DX: Anemia, unspecified: D64.9

## 2022-04-07 HISTORY — DX: Sleep apnea, unspecified: G47.30

## 2022-04-07 LAB — BLOOD GAS, ARTERIAL
Acid-Base Excess: 14.9 mmol/L — ABNORMAL HIGH (ref 0.0–2.0)
Bicarbonate: 40 mmol/L — ABNORMAL HIGH (ref 20.0–28.0)
O2 Saturation: 97.9 %
Patient temperature: 36.8
pCO2 arterial: 49 mmHg — ABNORMAL HIGH (ref 32–48)
pH, Arterial: 7.52 — ABNORMAL HIGH (ref 7.35–7.45)
pO2, Arterial: 76 mmHg — ABNORMAL LOW (ref 83–108)

## 2022-04-07 MED ORDER — ROPINIROLE HCL 1 MG PO TABS
3.0000 mg | ORAL_TABLET | Freq: Every day | ORAL | Status: DC
  Administered 2022-04-07 – 2022-05-05 (×29): 3 mg via ORAL
  Filled 2022-04-07 (×29): qty 3

## 2022-04-07 MED ORDER — SPIRONOLACTONE 25 MG PO TABS
25.0000 mg | ORAL_TABLET | Freq: Every day | ORAL | Status: DC
  Administered 2022-04-08 – 2022-05-06 (×29): 25 mg via ORAL
  Filled 2022-04-07 (×29): qty 1

## 2022-04-07 MED ORDER — POLYETHYLENE GLYCOL 3350 17 G PO PACK: 17.0000 g | PACK | Freq: Every day | ORAL | Status: DC | PRN

## 2022-04-07 MED ORDER — SODIUM CHLORIDE 0.9% FLUSH
10.0000 mL | Freq: Two times a day (BID) | INTRAVENOUS | Status: DC
  Administered 2022-04-07 – 2022-04-29 (×28): 10 mL
  Administered 2022-04-30: 20 mL
  Administered 2022-05-01: 10 mL

## 2022-04-07 MED ORDER — FLEET ENEMA 7-19 GM/118ML RE ENEM: 1.0000 | ENEMA | Freq: Once | RECTAL | Status: DC | PRN

## 2022-04-07 MED ORDER — SODIUM CHLORIDE 0.9% FLUSH
10.0000 mL | INTRAVENOUS | Status: DC | PRN
  Administered 2022-04-14: 10 mL

## 2022-04-07 MED ORDER — B COMPLEX-C PO TABS
1.0000 | ORAL_TABLET | Freq: Every day | ORAL | Status: DC
  Administered 2022-04-07 – 2022-05-06 (×30): 1 via ORAL
  Filled 2022-04-07 (×31): qty 1

## 2022-04-07 MED ORDER — BENEPROTEIN PO POWD
1.0000 | Freq: Three times a day (TID) | ORAL | Status: DC
  Administered 2022-04-08 – 2022-04-13 (×14): 6 g via ORAL
  Filled 2022-04-07: qty 227

## 2022-04-07 MED ORDER — POLYSACCHARIDE IRON COMPLEX 150 MG PO CAPS
150.0000 mg | ORAL_CAPSULE | Freq: Every day | ORAL | Status: DC
  Administered 2022-04-07 – 2022-05-05 (×27): 150 mg via ORAL
  Filled 2022-04-07 (×31): qty 1

## 2022-04-07 MED ORDER — FENOFIBRATE 145 MG PO TABS
145.0000 mg | ORAL_TABLET | Freq: Every day | ORAL | Status: DC
Start: 2022-04-08 — End: 2022-04-08
  Filled 2022-04-07: qty 1

## 2022-04-07 MED ORDER — ACETAMINOPHEN 325 MG PO TABS
325.0000 mg | ORAL_TABLET | ORAL | Status: DC | PRN
  Administered 2022-04-07 – 2022-05-06 (×9): 650 mg via ORAL
  Filled 2022-04-07 (×9): qty 2

## 2022-04-07 MED ORDER — SODIUM CHLORIDE 0.9 % IV SOLN
INTRAVENOUS | Status: DC | PRN
Start: 2022-04-07 — End: 2022-05-03
  Administered 2022-04-12: 10 mL via INTRAVENOUS
  Administered 2022-04-21: 10 mL/h via INTRAVENOUS

## 2022-04-07 MED ORDER — MAGNESIUM OXIDE -MG SUPPLEMENT 400 (240 MG) MG PO TABS
400.0000 mg | ORAL_TABLET | Freq: Every day | ORAL | Status: DC
  Administered 2022-04-07 – 2022-05-06 (×30): 400 mg via ORAL
  Filled 2022-04-07 (×30): qty 1

## 2022-04-07 MED ORDER — POLYETHYLENE GLYCOL 3350 17 G PO PACK
17.0000 g | PACK | Freq: Every day | ORAL | Status: DC
  Administered 2022-04-07 – 2022-05-05 (×29): 17 g via ORAL
  Filled 2022-04-07 (×29): qty 1

## 2022-04-07 MED ORDER — SACCHAROMYCES BOULARDII 250 MG PO CAPS
250.0000 mg | ORAL_CAPSULE | Freq: Two times a day (BID) | ORAL | Status: DC
  Administered 2022-04-07 – 2022-05-06 (×58): 250 mg via ORAL
  Filled 2022-04-07 (×58): qty 1

## 2022-04-07 MED ORDER — FUROSEMIDE 20 MG PO TABS
60.0000 mg | ORAL_TABLET | Freq: Two times a day (BID) | ORAL | Status: DC
Start: 2022-04-07 — End: 2022-04-22
  Administered 2022-04-07 – 2022-04-22 (×30): 60 mg via ORAL
  Filled 2022-04-07 (×30): qty 1

## 2022-04-07 MED ORDER — ROPINIROLE HCL 1 MG PO TABS
2.0000 mg | ORAL_TABLET | Freq: Two times a day (BID) | ORAL | Status: DC
  Administered 2022-04-08 – 2022-05-06 (×57): 2 mg via ORAL
  Filled 2022-04-07 (×58): qty 2

## 2022-04-07 MED ORDER — SODIUM CHLORIDE 0.9 % IV SOLN
2.0000 g | INTRAVENOUS | Status: AC
  Administered 2022-04-07 – 2022-04-25 (×107): 2 g via INTRAVENOUS
  Filled 2022-04-07 (×112): qty 2000

## 2022-04-07 MED ORDER — PROCHLORPERAZINE EDISYLATE 10 MG/2ML IJ SOLN: 5.0000 mg | Freq: Four times a day (QID) | INTRAMUSCULAR | Status: DC | PRN

## 2022-04-07 MED ORDER — GUAIFENESIN-DM 100-10 MG/5ML PO SYRP: 5.0000 mL | ORAL_SOLUTION | Freq: Four times a day (QID) | ORAL | Status: DC | PRN

## 2022-04-07 MED ORDER — METHOCARBAMOL 500 MG PO TABS
500.0000 mg | ORAL_TABLET | Freq: Three times a day (TID) | ORAL | Status: DC
  Administered 2022-04-07 – 2022-05-06 (×87): 500 mg via ORAL
  Filled 2022-04-07 (×87): qty 1

## 2022-04-07 MED ORDER — CHLORHEXIDINE GLUCONATE CLOTH 2 % EX PADS
6.0000 | MEDICATED_PAD | Freq: Every day | CUTANEOUS | Status: DC
  Administered 2022-04-07 – 2022-04-25 (×17): 6 via TOPICAL

## 2022-04-07 MED ORDER — PROCHLORPERAZINE 25 MG RE SUPP
12.5000 mg | Freq: Four times a day (QID) | RECTAL | Status: DC | PRN
Start: 2022-04-07 — End: 2022-05-06

## 2022-04-07 MED ORDER — ONDANSETRON HCL 4 MG PO TABS
8.0000 mg | ORAL_TABLET | Freq: Three times a day (TID) | ORAL | Status: DC
  Administered 2022-04-07 – 2022-05-06 (×57): 8 mg via ORAL
  Filled 2022-04-07 (×69): qty 2

## 2022-04-07 MED ORDER — PROCHLORPERAZINE MALEATE 5 MG PO TABS: 5.0000 mg | ORAL_TABLET | Freq: Four times a day (QID) | ORAL | Status: DC | PRN

## 2022-04-07 MED ORDER — ALUM & MAG HYDROXIDE-SIMETH 200-200-20 MG/5ML PO SUSP: 30.0000 mL | ORAL | Status: DC | PRN

## 2022-04-07 MED ORDER — PANTOPRAZOLE SODIUM 40 MG PO TBEC
40.0000 mg | DELAYED_RELEASE_TABLET | Freq: Two times a day (BID) | ORAL | Status: DC
  Administered 2022-04-07 – 2022-05-06 (×58): 40 mg via ORAL
  Filled 2022-04-07 (×58): qty 1

## 2022-04-07 MED ORDER — SALINE SPRAY 0.65 % NA SOLN
1.0000 | NASAL | Status: DC | PRN
  Filled 2022-04-07: qty 44

## 2022-04-07 MED ORDER — TRAZODONE HCL 50 MG PO TABS
25.0000 mg | ORAL_TABLET | Freq: Every evening | ORAL | Status: DC | PRN
  Administered 2022-04-07 – 2022-05-02 (×2): 50 mg via ORAL
  Filled 2022-04-07 (×4): qty 1

## 2022-04-07 MED ORDER — SODIUM CHLORIDE 0.9 % IV SOLN
2.0000 g | Freq: Two times a day (BID) | INTRAVENOUS | Status: AC
  Administered 2022-04-07 – 2022-04-25 (×36): 2 g via INTRAVENOUS
  Filled 2022-04-07 (×38): qty 20

## 2022-04-07 MED ORDER — DIPHENHYDRAMINE HCL 12.5 MG/5ML PO ELIX: 12.5000 mg | ORAL_SOLUTION | Freq: Four times a day (QID) | ORAL | Status: DC | PRN

## 2022-04-07 MED ORDER — BISACODYL 10 MG RE SUPP: 10.0000 mg | Freq: Every day | RECTAL | Status: DC | PRN

## 2022-04-07 NOTE — Progress Notes (Signed)
Patient opted not to wear BiPAP tonight.  Patient stated her husband will bring her new home machine tomorrow.  Patient hopeful her new machine will help with her progress and she understands that she needs it.  Patient on 2L [place].  VSS.  RT will continue to monitor. ?

## 2022-04-07 NOTE — Progress Notes (Signed)
Noted on patients  lower extremities a protective barrier; patient claims to be a "52M" and was placed by a wound specialist from [facility] and per patient has  to stay for 21 days and peels off as the blister heals. Keesha RN,WTA notified. We will notify and follow up with [facility].  ?

## 2022-04-07 NOTE — H&P (Shared)
? ? ?Physical Medicine and Rehabilitation Admission H&P ? ?  ?CC: Debility. ? ? ?HPI:  Stacy Hanson is a 71 year old female with history of diastolic CHF with DOE,  lymph edema w/BLE and abdominal wounds (managed by lymphedema clinic, pumps and wound center), morbid obesity BMI- 60, OSA- declined CPAP, IFG, iron deficiency anemia, A fib with reports of melanotic stools despite d/c of Eliquis 03/05/22. She was admitted to [facility] on 03/07/22 with fatigue, anemia and fall off the toilet and inability to get up as was stuck between the wall and the commode.  She reported diffuse pain with inability to use LUE>RUE. Right hip films negative for fracture. She was transfused with 4 units PRBC due to severe anemia- Hgb 5.6  and underwent EGD 03/20 revealing clean based gastric and duodenal ulcer with flat pigment spot v/s vessel with was treated with endoclip by Dr. Karolee Ohs with recs to follow up in 8 weeks for repeat study. She was found to have enterococcus fecalis bacteremia and was started on IV antibiotics but continued to have fevers with rise in WBC.  ? ?She had extensive work up--CT abdomen pelvis showed small bilateral pleural effusions with atelectasis and large amount of inflammatory stranding involving pannus and lateral abdominal walls concerning for possible cellulitis. TTE showed EF 65-70% with mild to moderate biventricular dilatation, aortic sclerosis with trace regurgitation. CT left shoulder negative for dislocation or fracture and trace left pleural effusion. TEE ordered but not done due to patient's inability to lie flat. Antibiotic covered broadened to IV ampicillin and ceftriaxone 2 grams every 14 hours for 6 weeks --last negative culture 03/26-->end date of 04/25/22.Fluid overload managed with lasix drip due to hypotension  as well as FR 1500 cc with recs to continue infusion as well as diuretics.  ? ?She continued to have issues with hypoxia requiring supplemental oxygen per  [place], diffuse pain as well as weakness. She was discharged to [facility] for management on 03/18/22. She was transitioned to po lasix by 04/03 and aldactone added on 04/15. FR advanced to 2000 cc/day. She was transfused with one unit PRBC on 04/01 for Hgb 7.1 with improvement to low to mid 8 range.  Constipation treated with multiple  laxatives. Patient encouraged to utilize BIPAP with 2 Liters bled in to help manage hypoxia. Sr. BLE wounds resolved and now being treated with tegaderm, bilateral groin,pannus/perineum with local care, zinc/methol cream and sacral wound with foam dressing. Respiratory status improving but she continues  to ? ?Review of Systems  ?Constitutional:  Negative for chills and fever.  ?HENT:  Negative for hearing loss and tinnitus.   ?Eyes:  Negative for blurred vision and double vision.  ?Respiratory:  Negative for cough, shortness of breath and stridor.   ?Cardiovascular:  Negative for chest pain and palpitations.  ?Gastrointestinal:  Negative for constipation and heartburn.  ?Genitourinary:  Negative for dysuria.  ?Musculoskeletal:  Positive for back pain (chronic LBP), joint pain (LUE) and myalgias.  ?Neurological:  Positive for dizziness (history of vertigo--has to keep eyes closed for positional changes in bed), focal weakness and weakness.  ? ? ?Past Medical History:  ?Diagnosis Date  ? Atrial fibrillation ([place])   ? GIB (gastrointestinal bleeding)   ? Hematochezia   ? Lymphedema   ? Morbid obesity ([place])   ? Venous insufficiency of right leg   ? ? ? ?Past Surgical History:  ?Procedure Laterality Date  ? TOTAL KNEE ARTHROPLASTY    ? UMBILICAL HERNIA REPAIR    ? ? ? ?  Family History  ?Problem Relation Age of Onset  ? Barrett's esophagus Mother   ? Diabetes Father   ? Heart disease Father   ? Sudden Cardiac Death Father   ? Diabetes Brother   ? ? ? ?Social History:  has no history on file for tobacco use, alcohol use, and drug use. ? ? ?Allergies:  ?Allergies  ?Allergen Reactions  ? Amlodipine   ?  Gabapentin   ?  Question worsening of  edema  ? ? ? ?No medications prior to admission.  ? ? ? ? ?Home: ?Home Living ?Living Arrangements: Spouse/significant other ?Available Help at Discharge: Family, Available 24 hours/day ?Type of Home: House ?Home Access: Level entry ?Home Layout: One level ?Bathroom Shower/Tub: Other (comment) (walk in tub) ?Bathroom Toilet: Standard ?Bathroom Accessibility: No ? Lives With: Spouse ?  ?Functional History: ?Prior Function ?Prior Level of Function : Needs assist, Independent/Modified Independent ?Physical Assist : ADLs (physical), Mobility (physical) ?Mobility (physical): Transfers (car transfers only) ?ADLs (physical): Dressing (LB only) ? ?Functional Status:  ?Mobility: ?Working on sitting upright in bed. ?Was able to transfer out of bed with max assist  and WB on BLE yesterday.  ? ?  ? ?ADL: ?Needs total assist for UB and LB ADLs ?Working on [place] exercises.  ?Husband helps with meals.  ? ?Cognition: ? ?  ?Height 5\' 3"  (1.6 m), weight (!) 155.6 kg. ?Physical Exam ?Vitals and nursing note reviewed.  ?Constitutional:   ?   Comments: Morbidly obese female. NAD. 2 L oxygen per [place].   ?Abdominal:  ?   Comments: Large pannus with hyperkeratosis but dry without breakdown.    ?Musculoskeletal:  ?   Comments: Limited ROM left shoulder with stiffness and crepitus left elbow with attempts at ROM. No pain with PROM left shoulder.  1+ tibially and 2+ pedally. Tegaderm on shins bilaterally.   ?Skin: ?   General: Skin is warm and dry.  ?Neurological:  ?   Mental Status: She is alert and oriented to person, place, and time.  ? ? ? ? ?Recent Labs:  ? ?  Latest Ref Rng & Units 04/05/2022  ?  5:22 AM 04/03/2022  ? 10:08 AM 04/02/2022  ?  4:11 AM  ?BMP  ?Glucose 70 - 99 mg/dL 92    107    ?BUN 8 - 23 mg/dL 19    21    ?Creatinine 0.44 - 1.00 mg/dL 1.08    0.89    ?Sodium 135 - 145 mmol/L 141    136    ?Potassium 3.5 - 5.1 mmol/L 4.1   3.9   3.4    ?Chloride 98 - 111 mmol/L 99    97    ?CO2 22 - 32  mmol/L 35    34    ?Calcium 8.9 - 10.3 mg/dL 8.9    8.6    ?  ? ?  Latest Ref Rng & Units 04/02/2022  ?  4:11 AM 04/01/2022  ?  4:15 AM 03/31/2022  ?  4:45 AM  ?CBC  ?WBC 4.0 - 10.5 K/uL 8.8   8.6   8.5    ?Hemoglobin 12.0 - 15.0 g/dL 8.3   8.2   8.4    ?Hematocrit 36.0 - 46.0 % 27.3   27.0   27.9    ?Platelets 150 - 400 K/uL 384   396   399    ?  ? ? ?Height 5\' 3"  (1.6 m), weight (!) 155.6 kg. ? ?Medical Problem List and  Plan: ?1. Functional deficits secondary to *** ? -patient may *** shower ? -ELOS/Goals: *** ?2.  Antithrombotics: ?-DVT/anticoagulation:  Mechanical: Sequential compression devices, below knee Bilateral lower extremities ? -antiplatelet therapy: N/A due to GIB ?3. Left hip/shoulder pain/myalgias/ Pain Management: oxycodone prn for diffuse pain.  ?4. Mood: LCSW to follow for evaluation and support.  ? -antipsychotic agents: N/a ?5. Neuropsych: This patient is capable of making decisions on her own behalf. ?6. Skin/Wound Care: Pressure relief measures.  ?--Monitor for recurrent peripheral edema with increase in activity.  ?--modify FR as indicated.  ?7. Fluids/Electrolytes/Nutrition: Monitor I/O. Check CMET in am.  ?8. Enterococcus fecalis bacteremia: On Ampicillin and ceftriaxone with end date 05/07 ?9. ABLA due to GIB:  ON PPI BID. Will add iron supplement with Miralax daily ?--check anemia panel as question Vitamin B12 and iron deficiency.   ?10. CAF: Monitor HR TID--continue metoprolol.  ?11. Chronic diastolic CHF/fluid overload: Heart healthy diet. Strict I/O. ?-- 2000 cc (IV+ PO) FR--will change to 1000 cc po as getting IV every 4 hours.  ? --monitor for orthostatic changes (due to prior hx) ? --continue Lasix, metoprolol and spironolactone.  ?12. Restless leg syndrome: Managed with Requip 2 mg bid and 3 mg hs.  ?13. OSA/OHS: Hypercarbia noted but improved from 43-->35. ?-- Encourage BIPAP use as able to tolerate it for 1- 3 hours thorough the night.  ?--check ABG for baseline/BIPAP justification  if needed at discharge  ?--Wean oxygen as tolerated. Encourage pulmonary hygiene.  ?14. Morbid obesity: Educate on appropriate diet to promote mobility and health.  ?15. Peripheral edema: Greatly impro

## 2022-04-07 NOTE — Progress Notes (Signed)
PMR Admission Coordinator Pre-Admission Assessment ?  ?Patient: Stacy Hanson is an 71 y.o., female ?MRN: [id number] ?DOB: [birth year] ?Height: 5\' 3"  (160 cm) ?Weight: (!) 155.6 kg ?  ?Insurance Information ?HMO:     PPO:      PCP:      IPA:      80/20:      OTHER:  ?PRIMARY: Medicare A/B      Policy#: [id number]      Subscriber: pt ?CM Name:       Phone#:      Fax#:  ?Pre-Cert#: verified online      Employer:  ?Benefits:  Phone #:      Name:  ?Eff. Date: A/B 12/20/13     Deduct: $1600      Out of Pocket Max:       Life Max:  ?CIR: 100% (48 days remaining)     SNF: 20 full days ?Outpatient: 80%     Co-Ins: 20% ?Home Health: 100%      Co-Pay:  ?DME: 80%     Co-Ins: 20% ?Providers:  ?SECONDARY: [employer] [place]      Policy#: [id number]     Phone#: [phone] ?  ?Financial Counselor:       Phone#:  ?  ?The ?Data Collection Information Summary? for patients in Inpatient Rehabilitation Facilities with attached ?Privacy Act [place] Records? was provided and verbally reviewed with: Patient and Family ?  ?Emergency Contact Information ?Contact Information   ?  ?  Name Relation Home Work Mobile  ?  [place] Spouse     [phone]  ?  ?   ?  ?  ?Current Medical History  ?Patient Admitting Diagnosis: debility 2/2 GI bleed and sepsis ?History of Present Illness: Pt is a 71 y/o female with PMH of Afib, HTN, CHF, lymphedema, and morbid obesity admitted to [facility] on 3/19 following a fall at home.  Pt had experienced increasing weakness, fatigue, and melanic stools for several weeks prior to admission.  Pt had been on Eliquis but had stopped on 3/14 due to melanic stools.  On day of admit to [facility] she fell in her bathroom and was wedged between the toilet and the wall.  EMS had to dislocate her shoulder.  Hemoglobin on presentation was 5.6.  She received 4 units PRBCs with no further melanic stools.  EGD was performed on 3/20 demonstrating 3 ulcers with 1 a flat pigmented spot versus visible  vessel, endo-clipped.  GI recommended PPI.  CT abd/pelvis negative for retroperitoneal hemorrhage.  Low hgb felt to be due to low volume so started on lasix drip.  Pt also with blood cultures positive for enterococcus bacteremia with leukocytosis so started on broad spectrum antibiotics on 3/24.  Echocardiogram on 3/24 showed no evidence of valvular disease.  Pt not able to tolerate lying flat for TEE to r/o endocarditis so infectious disease recommended treating x6 weeks with ampicillin and ceftriaxone for possible endocarditis.  Last day of abx is May 8.  WBC improved to 11.3 by 3/30.  Pt with bouts of severe pain in BUEs and BLEs x-rays negative.  Pt with episodes of hypoxemia on 3/22, cxr revealed trace pleural effusions.  Pt currently tolerating on CPAP at night and 2L O2 nasal cannula during the day.  Pt with multiple skin tears and blisters to LEs with wound care following.  [facility]  She remains on IV ampicillin and ceftriaxone  via IV.  Lasix transitioned to PO.  Therapy ongoing and pt was recommended for CIR to maximize independence prior to discharge home with spouse.  ?  ?Patient's medical record from [facility] has been reviewed by the rehabilitation admission coordinator and physician. ?  ?Past Medical History  ?No past medical history on file. ?  ?Has the patient had major surgery during 100 days prior to admission? No ?  ?Family History   ?family history is not on file. ?  ?Current Medications ?No current facility-administered medications for this encounter. ?  ?Patients Current Diet: Diet reg/thin ?  ?Precautions / Restrictions ?Precautions: Fall ?Weight Bearing Restrictions: No ?  ?  ?Has the patient had 2 or more falls or a fall with injury in the past year? Yes ?  ?Prior Activity Level ?Limited Community (1-2x/wk): pt was mod I with rollator prior to hospitalization, assist for LB dressing but otherwise mod I with  ADLs and IADLs, not driving ?  ?  ?Prior Functional Level ?Self Care: Did the patient need help bathing, dressing, using the toilet or eating? Needed some help ?  ?Indoor Mobility: Did the patient need assistance with walking from room to room (with or without device)? Independent ?  ?Stairs: Did the patient need assistance with internal or external stairs (with or without device)? Independent ?  ?Functional Cognition: Did the patient need help planning regular tasks such as shopping or remembering to take medications? Independent ?  ?Patient Information ?Are you of Hispanic, Latino/a,or Spanish origin?: A. No, not of Hispanic, Latino/a, or Spanish origin ?What is your race?: A. White ?Do you need or want an interpreter to communicate with a doctor or health care staff?: 0. No ?  ?Patient's Response To:  ?Health Literacy and Transportation ?Is the patient able to respond to health literacy and transportation needs?: Yes ?Health Literacy - How often do you need to have someone help you when you read instructions, pamphlets, or other written material from your doctor or pharmacy?: Never ?In the past 12 months, has lack of transportation kept you from medical appointments or from getting medications?: No ?In the past 12 months, has lack of transportation kept you from meetings, work, or from getting things needed for daily living?: No ?  ?Home Assistive Devices / Equipment ?Walker (specify type) (rollator) ?  ?  ?Prior Device Use: Indicate devices/aids used by the patient prior to current illness, exacerbation or injury?  rollator ?  ?  Prior Functional Level Current Functional Level  ?Bed Mobility ?  -- (sleeps in a recliner) ?  Max assist ?   ?Transfers ?  Mod Independent ?  Max assist ?   ?Mobility - Walk/Wheelchair ?  Mod Independent ?     ?Upper Body Dressing ?  Mod Independent ?  Total assist ?   ?Lower Body Dressing ?  Mod assist ?  Total assist ?   ?Grooming ?  Mod Independent ?  Total assist ?    ?Eating/Drinking ?  Mod Independent ?  Total assist ?   ?Toilet Transfer ?  Mod Independent ?  Max assist ?   ?Bladder Continence  ?  continent ?  incontinent ?   ?Bowel Management ?  continent ?  incontinent ?   ?Stair Climbing ?  Mod Independent ?     ?Communication ?  Indep ?  indep ?   ?Memory ?  Indep ?  indep ?   ?  ?Special Needs/ Care Considerations ?CPAP, Continuous Drip  IV  ampicillin and ceftrixone, Skin multiple skin tears to BLEs ant/post, sacrococcygeal stage 1, excoriation to pannus, and Special service needs bariatric DME ?  ?Previous Home Environment (from acute therapy documentation) ?Living Arrangements: Spouse/significant other ? Lives With: Spouse ?Available Help at Discharge: Family; Available 24 hours/day ?Type of Home: House ?Home Layout: One level ?Home Access: Level entry ?Bathroom Shower/Tub: Other (comment) (walk in tub) ?Bathroom Toilet: Standard ?Bathroom Accessibility: No ?Home Care Services: No ?  ?  ?Discharge Living Setting ?Plans for Discharge Living Setting: Patient's home; Lives with (comment) (spouse) ?Type of Home at Discharge: House ?Discharge Home Layout: One level ?Discharge Home Access: Level entry ?Discharge Bathroom Shower/Tub: Other (comment) (walk in tub) ?Discharge Bathroom Toilet: Standard ?Discharge Bathroom Accessibility: No ?Does the patient have any problems obtaining your medications?: No ?  ?  ?Social/Family/Support Systems ?Patient Roles: Spouse ?Anticipated Caregiver: spouse, Alphia Moh ?Anticipated Caregiver's Contact Information: [phone] ?Ability/Limitations of Caregiver: min guard mobility, max assist for ADLs ?Caregiver Availability: 24/7 ?Discharge Plan Discussed with Primary Caregiver: Yes ?Is Caregiver In Agreement with Plan?: Yes ?Does Caregiver/Family have Issues with Lodging/Transportation while Pt is in Rehab?: No ?  ?  ?Goals ?Patient/Family Goal for Rehab: PT min assist transfers and short household distances; OT mod/max for ADLs; SLP  n/a ?Expected length of stay: 14-16 days ?Additional Information: Pt with ?neuro versus muskuloskeletal injury to [place] with extracation from bathroom causing severe UE mobility restrictions ?Pt/Family Agrees to [employer]

## 2022-04-07 NOTE — Progress Notes (Signed)
Inpatient Rehabilitation Admission Medication Review by a Pharmacist ? ?A complete drug regimen review was completed for this patient to identify any potential clinically significant medication issues. ? ?High Risk Drug Classes Is patient taking? Indication by Medication  ?Antipsychotic Yes Compazine- N/V  ?Anticoagulant No   ?Antibiotic Yes Ampicillin, ceftriaxone- IE  ?Opioid No   ?Antiplatelet No   ?Hypoglycemics/insulin No   ?Vasoactive Medication Yes Lasix, aldactone- hypertension  ?Chemotherapy No   ?Other Yes Tricor- hypertriglyceridemia ?Protonix- GIB ?Requip- RLS  ? ? ? ?Type of Medication Issue Identified Description of Issue Recommendation(s)  ?Drug Interaction(s) (clinically significant) ?    ?Duplicate Therapy ?    ?Allergy ?    ?No Medication Administration End Date ? Ampicillin / ceftriaxone End date is after last doses on 04/25/2022  ?Incorrect Dose ?    ?Additional Drug Therapy Needed ?    ?Significant med changes from prior encounter (inform family/care partners about these prior to discharge).    ?Other ?    ? ? ?Clinically significant medication issues were identified that warrant physician communication and completion of prescribed/recommended actions by midnight of the next day:  No ? ?Time spent performing this drug regimen review (minutes):  30 ? ? ?Amye Grego BS, PharmD, BCPS ?Clinical Pharmacist ?04/07/2022 2:18 PM ? ?Contact: [phone] after 3 PM ? ?"Be curious, not judgmental..." -Jamal Maes ?

## 2022-04-07 NOTE — H&P (Signed)
? ? ?Physical Medicine and Rehabilitation Admission H&P ? ?  ?CC: Debility. ? ?HPI:  Stacy Hanson is a 71 year old female with history of diastolic CHF with DOE,  lymph edema w/BLE and abdominal wounds (managed by lymphedema clinic, pumps and wound center), morbid obesity BMI- 60, OSA- declined CPAP, IFG, iron deficiency anemia, A fib with reports of melanotic stools despite d/c of Eliquis 03/05/22. She was admitted to [facility] on 03/07/22 with fatigue, anemia and fall off the toilet and inability to get up as was stuck between the wall and the commode.  She reported diffuse pain with inability to use LUE>RUE. Right hip films negative for fracture. She was transfused with 4 units PRBC due to severe anemia- Hgb 5.6  and underwent EGD 03/20 revealing clean based gastric and duodenal ulcer with flat pigment spot v/s vessel with was treated with endoclip by Dr. Karolee Ohs with recs to follow up in 8 weeks for repeat study. She was found to have enterococcus fecalis bacteremia and was started on IV antibiotics but continued to have fevers with rise in WBC.  ? ?She had extensive work up--CT abdomen pelvis showed small bilateral pleural effusions with atelectasis and large amount of inflammatory stranding involving pannus and lateral abdominal walls concerning for possible cellulitis. TTE showed EF 65-70% with mild to moderate biventricular dilatation, aortic sclerosis with trace regurgitation. CT left shoulder negative for dislocation or fracture and trace left pleural effusion. TEE ordered but not done due to patient's inability to lie flat. Antibiotic covered broadened to IV ampicillin and ceftriaxone 2 grams every 14 hours for 6 weeks --last negative culture 03/26-->end date of 04/25/22.Fluid overload managed with lasix drip due to hypotension  as well as FR 1500 cc with recs to continue infusion as well as diuretics.  ? ?She continued to have issues with hypoxia requiring supplemental oxygen per [place],  diffuse pain as well as weakness. She was discharged to [facility] for management on 03/18/22. She was transitioned to po lasix by 04/03 and aldactone added on 04/15. FR advanced to 2000 cc/day. She was transfused with one unit PRBC on 04/01 for Hgb 7.1 with improvement to low to mid 8 range.  Constipation treated with multiple  laxatives. Patient encouraged to utilize BIPAP with 2 Liters bled in to help manage hypoxia. Sr. BLE wounds resolved and now being treated with tegaderm, bilateral groin,pannus/perineum with local care, zinc/methol cream and sacral wound with foam dressing. Respiratory status improving. Has decreased range of motion in both shoulders.  ? ?Review of Systems  ?Constitutional:  Negative for chills and fever.  ?HENT:  Negative for hearing loss and tinnitus.   ?Eyes:  Negative for blurred vision and double vision.  ?Respiratory:  Negative for cough, shortness of breath and stridor.   ?Cardiovascular:  Negative for chest pain and palpitations.  ?Gastrointestinal:  Negative for constipation and heartburn.  ?Genitourinary:  Negative for dysuria.  ?Musculoskeletal:  Positive for back pain (chronic LBP), joint pain (LUE) and myalgias.  ?Neurological:  Positive for dizziness (history of vertigo--has to keep eyes closed for positional changes in bed), focal weakness and weakness.  ? ? ?Past Medical History:  ?Diagnosis Date  ? Anemia   ? Arthritis   ? Atrial fibrillation ([place])   ? CHF (congestive heart failure) ([place])   ? Diabetes mellitus without complication ([place])   ? Dysrhythmia   ? GIB (gastrointestinal bleeding)   ? Hematochezia   ? Hypertension   ? Lymphedema   ? Morbid  obesity ([place])   ? Neuromuscular disorder ([place])   ? Sleep apnea   ? Venous insufficiency of right leg   ? ? ? ?Past Surgical History:  ?Procedure Laterality Date  ? JOINT REPLACEMENT    ? TOTAL KNEE ARTHROPLASTY    ? UMBILICAL HERNIA REPAIR    ? ? ? ?Family History  ?Problem Relation Age of Onset  ? Barrett's esophagus Mother   ? Diabetes  Father   ? Heart disease Father   ? Sudden Cardiac Death Father   ? Diabetes Brother   ? ? ? ?Social History:  reports that she has never smoked. She has never used smokeless tobacco. She reports that she does not drink alcohol and does not use drugs. ? ? ?Allergies:  ?Allergies  ?Allergen Reactions  ? Amlodipine Other (See Comments)  ?  Unknown - listed on MAR  ? Gabapentin Other (See Comments)  ?  Question worsening of  edema  ? Seroquel [Quetiapine] Other (See Comments)  ?  halucinations  ? ? ? ?Medications Prior to Admission  ?Medication Sig Dispense Refill  ? acetaminophen (TYLENOL) 325 MG tablet Take 650 mg by mouth every 6 (six) hours as needed for mild pain or fever.    ? ampicillin 2 g in sodium chloride 0.9 % 100 mL Inject 2 g into the vein every 6 (six) hours. For 6 weeks - until 5.8.23    ? cefTRIAXone 2 g in sodium chloride 0.9 % 100 mL Inject 2 g into the vein in the morning and at bedtime. For 6 weeks - until 5.8.23    ? docusate sodium (COLACE) 100 MG capsule Take 100 mg by mouth 2 (two) times daily.    ? feeding supplement, ENSURE COMPLETE, (ENSURE COMPLETE) LIQD Take 237 mLs by mouth daily.    ? fenofibrate (TRICOR) 145 MG tablet Take 145 mg by mouth daily.    ? furosemide (LASIX) 40 MG tablet Take 40 mg by mouth 2 (two) times daily.    ? magnesium oxide (MAG-OX) 400 MG tablet Take 400 mg by mouth 2 (two) times daily.    ? methocarbamol (ROBAXIN) 500 MG tablet Take 500 mg by mouth in the morning, at noon, and at bedtime.    ? metoprolol tartrate (LOPRESSOR) 25 MG tablet Take 25 mg by mouth 2 (two) times daily.    ? Multiple Vitamin (TAB-A-VITE/BETA CAROTENE PO) Take 1 tablet by mouth daily.    ? ondansetron (ZOFRAN) 4 MG tablet Take 4 mg by mouth every 8 (eight) hours.    ? oxyCODONE (OXY IR/ROXICODONE) 5 MG immediate release tablet Take 5 mg by mouth every 4 (four) hours as needed (pain).    ? pantoprazole (PROTONIX) 40 MG tablet Take 40 mg by mouth in the morning and at bedtime.    ? Pollen  Extracts (PROSTAT PO) Take 30 mLs by mouth daily.    ? polyethylene glycol (MIRALAX / GLYCOLAX) 17 g packet Take 17 g by mouth daily as needed (constipation).    ? potassium chloride SA (KLOR-CON M) 20 MEQ tablet Take 20 mEq by mouth every 4 (four) hours as needed (low potassium).    ? Probiotic Product (DIGESTIVE ADVANTAGE) CAPS Take 1 capsule by mouth daily.    ? rOPINIRole (REQUIP) 1 MG tablet Take 2-3 mg by mouth See admin instructions. 2mg  at 0600, 2mg  at 1400, and 3mg  at 2200    ? sodium chloride (OCEAN) 0.65 % SOLN nasal spray Place 1 spray into both nostrils daily.    ?  Sodium Phosphates (ENEMA) 7-19 GM/118ML ENEM Place 1 enema rectally daily as needed (constipation).    ? sodium polystyrene (KAYEXALATE) 15 GM/60ML suspension Take 15 g by mouth as needed (low potassium).    ? spironolactone (ALDACTONE) 25 MG tablet Take 25 mg by mouth daily.    ? traMADol (ULTRAM) 50 MG tablet Take 50 mg by mouth every 8 (eight) hours as needed (mild pain).    ? ?Home: ?Home Living ?Living Arrangements: Spouse/significant other ?Available Help at Discharge: Family, Available 24 hours/day ?Type of Home: House ?Home Access: Level entry ?Home Layout: One level ?Bathroom Shower/Tub: Other (comment) (walk in tub) ?Bathroom Toilet: Standard ?Bathroom Accessibility: No ? Lives With: Spouse ?  ?Functional History: ?Prior Function ?Prior Level of Function : Needs assist, Independent/Modified Independent ?Physical Assist : ADLs (physical), Mobility (physical) ?Mobility (physical): Transfers (car transfers only) ?ADLs (physical): Dressing (LB only) ?  ?Functional Status:  ?Mobility: ?Working on sitting upright in bed. ?Was able to transfer out of bed with max assist  and WB on BLE yesterday. ? ?ADL: ?Needs total assist for UB and LB ADLs ?Working on [place] exercises.  ?Husband helps with meals.  ? ?Cognition: ? ?  ?Blood pressure 119/62, pulse 65, temperature 98.2 ?F (36.8 ?C), temperature source Oral, resp. rate 16, height 5\' 3"  (1.6  m), weight (!) 155.6 kg, SpO2 97 %. ?Physical Exam ?Vitals and nursing note reviewed.  ?Constitutional:   ?   Comments: Morbidly obese female. NAD. 2 L oxygen per [place].  BMI 60.77 ?Cardiac: RRR ?Pulmonolog

## 2022-04-07 NOTE — Progress Notes (Signed)
Patient arrived on unit. Rehab schedule, medications and plan of care reviewed, patient states an understanding. Patient is Ax4 and has no complications noted at this time. Patient reports pain 0 out of 10. Patient educated on use of call light. Stacy Hanson  

## 2022-04-08 ENCOUNTER — Inpatient Hospital Stay (HOSPITAL_COMMUNITY): Payer: Medicare Other

## 2022-04-08 DIAGNOSIS — R609 Edema, unspecified: Secondary | ICD-10-CM | POA: Diagnosis not present

## 2022-04-08 DIAGNOSIS — R5381 Other malaise: Secondary | ICD-10-CM | POA: Diagnosis not present

## 2022-04-08 LAB — CBC WITH DIFFERENTIAL/PLATELET
Abs Immature Granulocytes: 0.03 10*3/uL (ref 0.00–0.07)
Basophils Absolute: 0.1 10*3/uL (ref 0.0–0.1)
Basophils Relative: 2 %
Eosinophils Absolute: 0.4 10*3/uL (ref 0.0–0.5)
Eosinophils Relative: 5 %
HCT: 26.8 % — ABNORMAL LOW (ref 36.0–46.0)
Hemoglobin: 7.7 g/dL — ABNORMAL LOW (ref 12.0–15.0)
Immature Granulocytes: 0 %
Lymphocytes Relative: 15 %
Lymphs Abs: 1.1 10*3/uL (ref 0.7–4.0)
MCH: 25.4 pg — ABNORMAL LOW (ref 26.0–34.0)
MCHC: 28.7 g/dL — ABNORMAL LOW (ref 30.0–36.0)
MCV: 88.4 fL (ref 80.0–100.0)
Monocytes Absolute: 0.6 10*3/uL (ref 0.1–1.0)
Monocytes Relative: 7 %
Neutro Abs: 5.5 10*3/uL (ref 1.7–7.7)
Neutrophils Relative %: 71 %
Platelets: 314 10*3/uL (ref 150–400)
RBC: 3.03 MIL/uL — ABNORMAL LOW (ref 3.87–5.11)
RDW: 16.4 % — ABNORMAL HIGH (ref 11.5–15.5)
WBC: 7.7 10*3/uL (ref 4.0–10.5)
nRBC: 0.3 % — ABNORMAL HIGH (ref 0.0–0.2)

## 2022-04-08 LAB — COMPREHENSIVE METABOLIC PANEL
ALT: 10 U/L (ref 0–44)
AST: 16 U/L (ref 15–41)
Albumin: 2.2 g/dL — ABNORMAL LOW (ref 3.5–5.0)
Alkaline Phosphatase: 80 U/L (ref 38–126)
Anion gap: 6 (ref 5–15)
BUN: 25 mg/dL — ABNORMAL HIGH (ref 8–23)
CO2: 36 mmol/L — ABNORMAL HIGH (ref 22–32)
Calcium: 8.7 mg/dL — ABNORMAL LOW (ref 8.9–10.3)
Chloride: 99 mmol/L (ref 98–111)
Creatinine, Ser: 1.31 mg/dL — ABNORMAL HIGH (ref 0.44–1.00)
GFR, Estimated: 43 mL/min — ABNORMAL LOW (ref >60)
Glucose, Bld: 94 mg/dL (ref 70–99)
Potassium: 4.2 mmol/L (ref 3.5–5.1)
Sodium: 141 mmol/L (ref 135–145)
Total Bilirubin: 0.6 mg/dL (ref 0.3–1.2)
Total Protein: 6 g/dL — ABNORMAL LOW (ref 6.5–8.1)

## 2022-04-08 LAB — IRON AND TIBC
Iron: 33 ug/dL (ref 28–170)
Saturation Ratios: 8 % — ABNORMAL LOW (ref 10.4–31.8)
TIBC: 421 ug/dL (ref 250–450)
UIBC: 388 ug/dL

## 2022-04-08 LAB — RETICULOCYTES
Immature Retic Fract: 24 % — ABNORMAL HIGH (ref 2.3–15.9)
RBC.: 3 MIL/uL — ABNORMAL LOW (ref 3.87–5.11)
Retic Count, Absolute: 68.4 10*3/uL (ref 19.0–186.0)
Retic Ct Pct: 2.3 % (ref 0.4–3.1)

## 2022-04-08 LAB — GLUCOSE, CAPILLARY
Glucose-Capillary: 118 mg/dL — ABNORMAL HIGH (ref 70–99)
Glucose-Capillary: 89 mg/dL (ref 70–99)

## 2022-04-08 LAB — VITAMIN B12: Vitamin B-12: 508 pg/mL (ref 180–914)

## 2022-04-08 LAB — FERRITIN: Ferritin: 36 ng/mL (ref 11–307)

## 2022-04-08 LAB — FOLATE: Folate: 11.8 ng/mL (ref >5.9)

## 2022-04-08 MED ORDER — FENOFIBRATE 160 MG PO TABS
160.0000 mg | ORAL_TABLET | Freq: Every day | ORAL | Status: DC
  Administered 2022-04-08 – 2022-05-06 (×29): 160 mg via ORAL
  Filled 2022-04-08 (×29): qty 1

## 2022-04-08 MED ORDER — DARBEPOETIN ALFA 60 MCG/0.3ML IJ SOSY
60.0000 ug | PREFILLED_SYRINGE | INTRAMUSCULAR | Status: DC
  Administered 2022-04-08 – 2022-04-29 (×4): 60 ug via SUBCUTANEOUS
  Filled 2022-04-08 (×4): qty 0.3

## 2022-04-08 MED ORDER — OXYCODONE HCL 5 MG PO TABS
5.0000 mg | ORAL_TABLET | Freq: Four times a day (QID) | ORAL | Status: DC | PRN
  Administered 2022-04-08 – 2022-05-06 (×26): 5 mg via ORAL
  Filled 2022-04-08 (×27): qty 1

## 2022-04-08 MED ORDER — SODIUM CHLORIDE 0.9 % IV SOLN
125.0000 mg | INTRAVENOUS | Status: AC
  Administered 2022-04-08 – 2022-04-20 (×6): 125 mg via INTRAVENOUS
  Filled 2022-04-08 (×6): qty 10

## 2022-04-08 MED ORDER — TRAMADOL HCL 50 MG PO TABS
100.0000 mg | ORAL_TABLET | Freq: Three times a day (TID) | ORAL | Status: DC
  Administered 2022-04-08 – 2022-05-06 (×83): 100 mg via ORAL
  Filled 2022-04-08 (×85): qty 2

## 2022-04-08 NOTE — Evaluation (Signed)
Physical Therapy Assessment and Plan ? ?Patient Details  ?Name: Stacy Hanson ?MRN: [id number] ?Date of Birth: 1951/10/04 ? ?PT Diagnosis: Difficulty walking, Muscle weakness, and Pain in low back ?Rehab Potential: Fair ?ELOS: 24-26 days  ? ?Today's Date: 04/09/2022 ?PT Individual Time: 1105-1200 ?    ? ?Hospital Problem: Principal Problem: ?  Debility ? ? ?Past Medical History:  ?Past Medical History:  ?Diagnosis Date  ? Anemia   ? Arthritis   ? Atrial fibrillation ([place])   ? CHF (congestive heart failure) ([place])   ? Diabetes mellitus without complication ([place])   ? Dysrhythmia   ? GIB (gastrointestinal bleeding)   ? Hematochezia   ? Hypertension   ? Lymphedema   ? Morbid obesity ([place])   ? Neuromuscular disorder ([place])   ? Sleep apnea   ? Venous insufficiency of right leg   ? ?Past Surgical History:  ?Past Surgical History:  ?Procedure Laterality Date  ? JOINT REPLACEMENT    ? TOTAL KNEE ARTHROPLASTY    ? UMBILICAL HERNIA REPAIR    ? ? ?Assessment & Plan ?Clinical Impression: Patient is a  71 year old female with history of diastolic CHF with DOE,  lymph edema w/BLE and abdominal wounds (managed by lymphedema clinic, pumps and wound center), morbid obesity BMI- 60, OSA- declined CPAP, IFG, iron deficiency anemia, A fib with reports of melanotic stools despite d/c of Eliquis 03/05/22. She was admitted to [facility] on 03/07/22 with fatigue, anemia and fall off the toilet and inability to get up as was stuck between the wall and the commode.  She reported diffuse pain with inability to use LUE>RUE. Right hip films negative for fracture. She was transfused with 4 units PRBC due to severe anemia- Hgb 5.6  and underwent EGD 03/20 revealing clean based gastric and duodenal ulcer with flat pigment spot v/s vessel with was treated with endoclip by Dr. Karolee Ohs with recs to follow up in 8 weeks for repeat study. She was found to have enterococcus fecalis bacteremia and was started on IV antibiotics but continued  to have fevers with rise in WBC.  ?  ?She had extensive work up--CT abdomen pelvis showed small bilateral pleural effusions with atelectasis and large amount of inflammatory stranding involving pannus and lateral abdominal walls concerning for possible cellulitis. TTE showed EF 65-70% with mild to moderate biventricular dilatation, aortic sclerosis with trace regurgitation. CT left shoulder negative for dislocation or fracture and trace left pleural effusion. TEE ordered but not done due to patient's inability to lie flat. Antibiotic covered broadened to IV ampicillin and ceftriaxone 2 grams every 14 hours for 6 weeks --last negative culture 03/26-->end date of 04/25/22.Fluid overload managed with lasix drip due to hypotension  as well as FR 1500 cc with recs to continue infusion as well as diuretics.  ?  ?She continued to have issues with hypoxia requiring supplemental oxygen per [place], diffuse pain as well as weakness. She was discharged to [facility] for management on 03/18/22. She was transitioned to po lasix by 04/03 and aldactone added on 04/15. FR advanced to 2000 cc/day. She was transfused with one unit PRBC on 04/01 for Hgb 7.1 with improvement to low to mid 8 range.  Constipation treated with multiple  laxatives. Patient encouraged to utilize BIPAP with 2 Liters bled in to help manage hypoxia. Sr. BLE wounds resolved and now being treated with tegaderm, bilateral groin,pannus/perineum with local care, zinc/methol cream and sacral wound with foam dressing. Respiratory status improving. Has decreased range  of motion in both shoulders.  Patient transferred to CIR on 04/07/2022 .  ? ?Patient currently requires total with mobility secondary to muscle weakness and muscle joint tightness, decreased cardiorespiratoy endurance and decreased oxygen support, and decreased balance strategies.  Prior to hospitalization, patient was min with mobility and lived with Spouse in a House home.  Home access is  Level entry, Ramped  entrance. ? ?Patient will benefit from skilled PT intervention to maximize safe functional mobility, minimize fall risk, and decrease caregiver burden for planned discharge home with 24 hour assist.  Anticipate patient will benefit from follow up [facility] at discharge. ? ?PT - End of Session ?Activity Tolerance: Tolerates < 10 min activity with changes in vital signs ?Endurance Deficit: Yes ?PT Assessment ?Rehab Potential (ACUTE/IP ONLY): Fair ?PT Barriers to Discharge: Inaccessible home environment;Decreased caregiver support;Home environment access/layout;Incontinence;Weight ?PT Patient demonstrates impairments in the following area(s): Balance;Edema;Endurance;Motor;Pain;Safety;Sensory;Skin Integrity ?PT Transfers Functional Problem(s): Bed Mobility;Bed to Chair;Car;Furniture ?PT Locomotion Functional Problem(s): Ambulation;Wheelchair Mobility;Stairs ?PT Plan ?PT Intensity: Minimum of 1-2 x/day ,45 to 90 minutes ?PT Frequency: 5 out of 7 days ?PT Duration Estimated Length of Stay: 24-26 days ?PT Treatment/Interventions: Ambulation/gait training;Balance/vestibular training;Cognitive remediation/compensation;Disease management/prevention;Discharge planning;Community reintegration;DME/adaptive equipment instruction;Functional mobility training;Patient/family education;Pain management;Neuromuscular re-education;Therapeutic Activities;Psychosocial support;Skin care/wound management;Splinting/orthotics;Therapeutic Exercise;Stair training;UE/LE Strength taining/ROM;UE/LE Coordination activities;Visual/perceptual remediation/compensation;Wheelchair propulsion/positioning ?PT Transfers Anticipated Outcome(s): Min-mod assist transfers ?PT Locomotion Anticipated Outcome(s): mod gait short distances ?PT Recommendation ?Follow Up Recommendations: Home health PT ?Patient destination: Home ?Equipment Recommended: To be determined;Wheelchair (measurements);Wheelchair cushion (measurements) ? ? ?PT  Evaluation ?Precautions/Restrictions ?Precautions ?Precautions: Fall ?Restrictions ?Weight Bearing Restrictions: No ?General ?  Vital Signs ?Pain ?Pain Assessment ?Pain Scale: 0-10 ?Pain Score: 8  ?Pain Type: Chronic pain ?Pain Location: Back (radiating into BLE) ?Pain Orientation: Lower ?Pain Onset: On-going ?Patients Stated Pain Goal: 1 ?Pain Intervention(s): Medication (See eMAR);Repositioned ?Pain Interference ?Pain Interference ?Pain Effect on Sleep: 4. Almost constantly ?Pain Interference with Therapy Activities: 4. Almost constantly ?Pain Interference with Day-to-Day Activities: 4. Almost constantly ?Home Living/Prior Functioning ?Home Living ?Available Help at Discharge: Family;Available 24 hours/day ?Type of Home: House ?Home Access: Level entry;Ramped entrance ?Home Layout: One level ?Bathroom Shower/Tub: Other (comment) ?Bathroom Toilet: Standard ?Bathroom Accessibility: No ? Lives With: Spouse ?Prior Function ?Level of Independence: Requires assistive device for independence (rolling walker) ? Able to Take Stairs?: Reciprically ?Driving: Yes (more than a yearr ago) ?Vocation Requirements: likes to decorate home. gardens prior to [employer]  when able. ?Vision/Perception  ?Vision - History ?Ability to See in Adequate Light: 0 Adequate ?Perception ?Perception: Within Functional Limits ?Praxis ?Praxis: Intact  ?Cognition ?Overall Cognitive Status: Within Functional Limits for tasks assessed ?Arousal/Alertness: Awake/alert ?Attention: Focused;Sustained;Selective ?Focused Attention: Appears intact ?Sustained Attention: Appears intact ?Selective Attention: Appears intact ?Memory: Appears intact ?Awareness: Appears intact ?Problem Solving: Appears intact ?Safety/Judgment: Appears intact ?Sensation ?Sensation ?Light Touch: Impaired Detail ?Peripheral sensation comments: feet hurt and reports abnormal sensation. but able to detect all stimuli ?Light Touch Impaired Details: Impaired RLE;Impaired LLE ?Hot/Cold:  Appears Intact ?Proprioception: Appears Intact ?Coordination ?Gross Motor Movements are Fluid and Coordinated: No ?Fine Motor Movements are Fluid and Coordinated: Yes ?Coordination and Movement Description: pt with limited AROM and coordination o

## 2022-04-08 NOTE — Plan of Care (Signed)
?  Problem: RH Balance ?Goal: LTG Patient will maintain dynamic sitting balance (PT) ?Description: LTG:  Patient will maintain dynamic sitting balance with assistance during mobility activities (PT) ?Flowsheets (Taken 04/08/2022 1349) ?LTG: Pt will maintain dynamic sitting balance during mobility activities with:: Supervision/Verbal cueing ?Goal: LTG Patient will maintain dynamic standing balance (PT) ?Description: LTG:  Patient will maintain dynamic standing balance with assistance during mobility activities (PT) ?Flowsheets (Taken 04/08/2022 1349) ?LTG: Pt will maintain dynamic standing balance during mobility activities with:: Minimal Assistance - Patient > 75% ?  ?Problem: Sit to Stand ?Goal: LTG:  Patient will perform sit to stand with assistance level (PT) ?Description: LTG:  Patient will perform sit to stand with assistance level (PT) ?Flowsheets (Taken 04/08/2022 1349) ?LTG: PT will perform sit to stand in preparation for functional mobility with assistance level: Moderate Assistance - Patient 50 - 74% ?  ?Problem: RH Bed Mobility ?Goal: LTG Patient will perform bed mobility with assist (PT) ?Description: LTG: Patient will perform bed mobility with assistance, with/without cues (PT). ?Flowsheets (Taken 04/08/2022 1349) ?LTG: Pt will perform bed mobility with assistance level of: Moderate Assistance - Patient 50 - 74% ?  ?Problem: RH Bed to Chair Transfers ?Goal: LTG Patient will perform bed/chair transfers w/assist (PT) ?Description: LTG: Patient will perform bed to chair transfers with assistance (PT). ?Flowsheets (Taken 04/08/2022 1349) ?LTG: Pt will perform Bed to Chair Transfers with assistance level: Moderate Assistance - Patient 50 - 74% ?  ?Problem: RH Car Transfers ?Goal: LTG Patient will perform car transfers with assist (PT) ?Description: LTG: Patient will perform car transfers with assistance (PT). ?Flowsheets (Taken 04/08/2022 1349) ?LTG: Pt will perform car transfers with assist:: Moderate Assistance -  Patient 50 - 74% ?  ?Problem: RH Ambulation ?Goal: LTG Patient will ambulate in controlled environment (PT) ?Description: LTG: Patient will ambulate in a controlled environment, # of feet with assistance (PT). ?Flowsheets (Taken 04/08/2022 1349) ?LTG: Pt will ambulate in controlled environ  assist needed:: Moderate Assistance - Patient 50 - 74% ?LTG: Ambulation distance in controlled environment: 75ft with LRAD ?  ?Problem: RH Wheelchair Mobility ?Goal: LTG Patient will propel w/c in controlled environment (PT) ?Description: LTG: Patient will propel wheelchair in controlled environment, # of feet with assist (PT) ?Flowsheets (Taken 04/08/2022 1349) ?LTG: Pt will propel w/c in controlled environ  assist needed:: Minimal Assistance - Patient > 75% ?LTG: Propel w/c distance in controlled environment: 72ft ?  ?

## 2022-04-08 NOTE — Progress Notes (Signed)
Inpatient Rehabilitation  Patient information reviewed and entered into eRehab system by Nikiesha Milford Nester Bachus, OTR/L.   Information including medical coding, functional ability and quality indicators will be reviewed and updated through discharge.    

## 2022-04-08 NOTE — Progress Notes (Signed)
Physical Therapy Session Note ? ?Patient Details  ?Name: Stacy Hanson ?MRN: [id number] ?Date of Birth: August 17, 1951 ? ?Today's Date: 04/08/2022 ?PT Individual Time: 1340-1410 ?PT Individual Time Calculation (min): 30 min  ? ?Short Term Goals: ?Week 1:    ? ?Skilled Therapeutic Interventions/Progress Updates: Pt presented in bed stating significant pain and feel unable to participate in therapy. Pt states back and BLE with significant increased pain due to activities performed during OT and PT evals. PTA had extensive discussion with pt regarding purpose of CIR, expectations of CIR, and goals of CIR. Pt discussed high anxiety to which PTA Provided emotional support. Due to time spent discussing with pt and pain pt requesting HEP that can be performed in bed.Continued education that can provide a few exercises but primary focus is to perform functional activities during therapy session with pt verbalizing understanding. Pt was able to recall some exercises performed previously and PTA provided pt with gait belt to allow pt to perform AAROM with pt performing and demonstrating understanding. Pt left in bed at end of session with call bell within reach and IV team present for PICC line disconnect.  ?Access Code: [email or web address] ?URL: [email or web address] ?Date: 04/08/2022 ?Prepared by: Cornell Barman Banyan Goodchild ? ?Exercises ?- Supine Ankle Pumps  - 1 x daily - 7 x weekly - 3 sets - 10 reps ?- Supine Hip Abduction  - 1 x daily - 7 x weekly - 3 sets - 10 reps ?- Small Range Straight Leg Raise  - 1 x daily - 7 x weekly - 3 sets - 10 reps ?- Supine Heel Slide  - 1 x daily - 7 x weekly - 3 sets - 10 reps ?- Supine Gluteal Sets  - 1 x daily - 7 x weekly - 3 sets - 10 reps ? ?   ? ?Therapy Documentation ?Precautions:  ?Precautions ?Precautions: Fall ?Restrictions ?Weight Bearing Restrictions: No ?General: ?PT Amount of Missed Time (min): 30 Minutes ?PT Missed Treatment Reason: Other (Comment) (education but not skilled) ?Vital  Signs: ?Therapy Vitals ?Temp: 98.8 ?F (37.1 ?C) ?Temp Source: Oral ?Pulse Rate: 85 ?Resp: 18 ?BP: (!) 145/64 ?Patient Position (if appropriate): Lying ?Oxygen Therapy ?SpO2: 100 % ?O2 Device: Room Air ?Pain: ?  ?Mobility: ?  ?Locomotion : ?   ?Trunk/Postural Assessment : ?   ?Balance: ?  ?Exercises: ?  ?Other Treatments:   ? ? ? ?Therapy/Group: Individual Therapy ? ?[name] ?04/08/2022, 4:10 PM  ?

## 2022-04-08 NOTE — Progress Notes (Signed)
Lower extremity venous bilateral study completed.   Please see CV Proc for preliminary results.   Luane Rochon, RDMS, RVT  

## 2022-04-08 NOTE — Plan of Care (Signed)
?  Problem: RH Balance ?Goal: LTG Patient will maintain dynamic standing with ADLs (OT) ?Description: LTG:  Patient will maintain dynamic standing balance with assist during activities of daily living (OT)  ?Flowsheets (Taken 04/08/2022 1027) ?LTG: Pt will maintain dynamic standing balance during ADLs with: Minimal Assistance - Patient > 75% ?  ?Problem: Sit to Stand ?Goal: LTG:  Patient will perform sit to stand in prep for activites of daily living with assistance level (OT) ?Description: LTG:  Patient will perform sit to stand in prep for activites of daily living with assistance level (OT) ?Flowsheets (Taken 04/08/2022 1027) ?LTG: PT will perform sit to stand in prep for activites of daily living with assistance level: Minimal Assistance - Patient > 75% ?  ?Problem: RH Grooming ?Goal: LTG Patient will perform grooming w/assist,cues/equip (OT) ?Description: LTG: Patient will perform grooming with assist, with/without cues using equipment (OT) ?Flowsheets (Taken 04/08/2022 1027) ?LTG: Pt will perform grooming with assistance level of: Set up assist  ?  ?Problem: RH Bathing ?Goal: LTG Patient will bathe all body parts with assist levels (OT) ?Description: LTG: Patient will bathe all body parts with assist levels (OT) ?Flowsheets (Taken 04/08/2022 1027) ?LTG: Pt will perform bathing with assistance level/cueing: Moderate Assistance - Patient 50 - 74% ?  ?Problem: RH Dressing ?Goal: LTG Patient will perform upper body dressing (OT) ?Description: LTG Patient will perform upper body dressing with assist, with/without cues (OT). ?Flowsheets (Taken 04/08/2022 1027) ?LTG: Pt will perform upper body dressing with assistance level of: Minimal Assistance - Patient > 75% ?Goal: LTG Patient will perform lower body dressing w/assist (OT) ?Description: LTG: Patient will perform lower body dressing with assist, with/without cues in positioning using equipment (OT) ?Flowsheets (Taken 04/08/2022 1027) ?LTG: Pt will perform lower body  dressing with assistance level of: (with AE prn) Moderate Assistance - Patient 50 - 74% ?  ?Problem: RH Toileting ?Goal: LTG Patient will perform toileting task (3/3 steps) with assistance level (OT) ?Description: LTG: Patient will perform toileting task (3/3 steps) with assistance level (OT)  ?Flowsheets (Taken 04/08/2022 1027) ?LTG: Pt will perform toileting task (3/3 steps) with assistance level: Maximal Assistance - Patient 25 - 49% ?  ?Problem: RH Toilet Transfers ?Goal: LTG Patient will perform toilet transfers w/assist (OT) ?Description: LTG: Patient will perform toilet transfers with assist, with/without cues using equipment (OT) ?Flowsheets (Taken 04/08/2022 1027) ?LTG: Pt will perform toilet transfers with assistance level of: Minimal Assistance - Patient > 75% ?  ?Problem: RH Tub/Shower Transfers ?Goal: LTG Patient will perform tub/shower transfers w/assist (OT) ?Description: LTG: Patient will perform tub/shower transfers with assist, with/without cues using equipment (OT) ?Flowsheets (Taken 04/08/2022 1027) ?LTG: Pt will perform tub/shower stall transfers with assistance level of: Minimal Assistance - Patient > 75% ?  ?

## 2022-04-08 NOTE — Progress Notes (Signed)
Spoke with PT about bipap, Pt had home bipap brought to her, but the machine was in the box, unopened, and not set up. I offered her one of our machines, but she states that she is resting well and wants to wait till tommarow on day shift to try and get her machine set up. ?

## 2022-04-08 NOTE — Progress Notes (Signed)
?                                                       PROGRESS NOTE ? ? ?Subjective/Complaints: ? ?Bladder leaks in spite of purewick- pees forcefully due to Lasix.  ?LBM o/n ?Mainly low back ?Doesn't like bed- feels like in taco- and gets cocooned.  ? ?Ankles has new 11M product on it per [facility] given to husband.  ? ?Pain main issue except weakness. - using tramadol and Oxy on [facility].  ?Also scared of constipation with urin- explained needs it, so will give bowel meds.  ? ? ? ?Objective: ?  ?DG Chest Port 1 View ? ?Result Date: 04/07/2022 ?CLINICAL DATA:  PICC line placement EXAM: PORTABLE CHEST 1 VIEW COMPARISON:  03/16/2022 FINDINGS: Interval placement of right-sided PICC line with distal tip terminating at the level of the distal SVC. Stable cardiomegaly. Aortic atherosclerosis. Stable enlargement of the right hilum likely related to vascular congestion. Probable small bilateral pleural effusions. No pneumothorax. IMPRESSION: Interval placement of right-sided PICC line with distal tip terminating at the level of the distal SVC. Electronically Signed   By: Davina Poke D.O.   On: 04/07/2022 15:16   ?Recent Labs  ?  04/08/22 ?0603  ?WBC 7.7  ?HGB 7.7*  ?HCT 26.8*  ?PLT 314  ? ?Recent Labs  ?  04/08/22 ?0603  ?NA 141  ?K 4.2  ?CL 99  ?CO2 36*  ?GLUCOSE 94  ?BUN 25*  ?CREATININE 1.31*  ?CALCIUM 8.7*  ? ? ?Intake/Output Summary (Last 24 hours) at 04/08/2022 1348 ?Last data filed at 04/08/2022 0803 ?Gross per 24 hour  ?Intake 537.93 ml  ?Output 1 ml  ?Net 536.93 ml  ?  ? ?  ? ?Physical Exam: ?Vital Signs ?Blood pressure (!) 151/53, pulse 82, temperature 97.8 ?F (36.6 ?C), resp. rate 18, height 5\' 3"  (1.6 m), weight (!) 143.8 kg, SpO2 96 %. ? ? ? ?General: awake, alert, appropriate, sitting up in low air loss mattress; husband at bedside;  NAD ?HENT: conjugate gaze; oropharynx moist ?CV: regular rate; no JVD ?Pulmonary: CTA B/L; no W/R/R- good air movement ?GI: soft, NT, ND, (+)BS- hypoactive; protuberant- BMI  56 ?Psychiatric: appropriate- talkative ?Neurological: Ox3 ?Musculoskeletal:  ?   Comments: Limited ROM bilateral shoulder with stiffness and crepitus left elbow with attempts at ROM. No pain with PROM left shoulder.  1+ tibially and 2+ pedally. Tegaderm on shins bilaterally.  Diffuse weakness.  ?Skin: ?   General: Skin is warm and dry.  ?Wounds with tegaderm on B/L ankles ? ? ?Assessment/Plan: ?1. Functional deficits which require 3+ hours per day of interdisciplinary therapy in a comprehensive inpatient rehab setting. ?Physiatrist is providing close team supervision and 24 hour management of active medical problems listed below. ?Physiatrist and rehab team continue to assess barriers to discharge/monitor patient progress toward functional and medical goals ? ?Care Tool: ? ?Bathing ?   ?Body parts bathed by patient: Left upper leg, Right upper leg, Face  ? Body parts bathed by helper: Right arm, Left arm, Chest, Abdomen, Front perineal area, Buttocks, Right lower leg, Left lower leg ?  ?  ?Bathing assist Assist Level: Total Assistance - Patient < 25% ?  ?  ?Upper Body Dressing/Undressing ?Upper body dressing   ?What is the patient wearing?: Pull over shirt ?   ?  Upper body assist Assist Level: Total Assistance - Patient < 25% ?   ?Lower Body Dressing/Undressing ?Lower body dressing ? ? ?   ?What is the patient wearing?: Incontinence brief, Pants ? ?  ? ?Lower body assist Assist for lower body dressing: 2 Helpers ?   ? ?Toileting ?Toileting Toileting Activity did not occur Landscape architect and hygiene only):  (incontient)  ?Toileting assist Assist for toileting: Dependent - Patient 0% ?  ?  ?Transfers ?Chair/bed transfer ? ?Transfers assist ?   ? ?Chair/bed transfer assist level: Dependent - mechanical lift ?  ?  ?Locomotion ?Ambulation ? ? ?Ambulation assist ? ?   ? ?  ?  ?   ? ?Walk 10 feet activity ? ? ?Assist ?   ? ?  ?   ? ?Walk 50 feet activity ? ? ?Assist   ? ?  ?   ? ? ?Walk 150 feet activity ? ? ?Assist    ? ?  ?  ?  ? ?Walk 10 feet on uneven surface  ?activity ? ? ?Assist   ? ? ?  ?   ? ?Wheelchair ? ? ? ? ?Assist   ?  ?  ? ?  ?   ? ? ?Wheelchair 50 feet with 2 turns activity ? ? ? ?Assist ? ?  ?  ? ? ?   ? ?Wheelchair 150 feet activity  ? ? ? ?Assist ?   ? ? ?   ? ?Blood pressure (!) 151/53, pulse 82, temperature 97.8 ?F (36.6 ?C), resp. rate 18, height 5\' 3"  (1.6 m), weight (!) 143.8 kg, SpO2 96 %. ? ?Medical Problem List and Plan: ?1. Functional deficits secondary to debility- for complicated medical stay-CHF exacerbation/bacteremia ?            -patient may shower ?            -ELOS/Goals: 14-18 days [facility] PT and OT ?            Continue CIR- PT, OT - hasn't been OOB yet ?2.  Antithrombotics: ?-DVT/anticoagulation:  Mechanical: Sequential compression devices, below knee Bilateral lower extremities ?            -antiplatelet therapy: N/A due to GIB ?3. Left hip/shoulder pain/myalgias/ Pain Management: oxycodone prn for diffuse pain. ?4/20- will schedule Tramadol 100 mg q8 hours not q6 hours due to renal issues- Cr 1.31-  con't oxycodone for pain prn- was taking NSAIDs at home and caused GI bleed.  ?4. Mood: LCSW to follow for evaluation and support.  ?            -antipsychotic agents: N/a ?5. Neuropsych: This patient is capable of making decisions on her own behalf. ?6. Skin/Wound Care: Pressure relief measures.  ?--Monitor for recurrent peripheral edema with increase in activity.  ?--modify FR as indicated. ?4/20- on low air loss mattress- con't that and 82M tegaderm for ankles B/L per [facility]OC  ?7. Fluids/Electrolytes/Nutrition: Monitor I/O. Check CMET in am.  ?8. Enterococcus fecalis bacteremia: On Ampicillin and ceftriaxone with end date 05/07 ?9. ABLA due to GIB:  ON PPI BID. Will add iron supplement with Miralax daily ?--check anemia panel as question Vitamin B12 and iron deficiency.   ? 4/20- has iron deficiency ?10. CAF: Monitor HR TID--continue metoprolol.  ?11. Chronic diastolic CHF/fluid overload: Heart  healthy diet. Strict I/O. ?-- 2000 cc (IV+ PO) FR--will change to 1000 cc po as getting IV every 4 hours.  ?            --  monitor for orthostatic changes (due to prior hx) ?            --continue Lasix, metoprolol and spironolactone.  ? 4/20- Weight down to 143.8 kg- but changed bed- will monitor trend ?12. Restless leg syndrome: Continue with Requip 2 mg bid and 3 mg hs.  ?13. OSA/OHS: Hypercarbia noted but improved from 43-->35. ?-- Encourage BIPAP use as able to tolerate it for 1- 3 hours thorough the night.  ?--check ABG for baseline/BIPAP justification if needed at discharge  ?--Wean oxygen as tolerated. Encourage pulmonary hygiene.  ? 4/20- pt having husband bring in from home ?14. Morbid obesity: BMI 60.77. Educate on appropriate diet to promote mobility and health.  ? 4/20- BMI down slightly to 56.15- but likely due to bed change ?15. Peripheral edema: Greatly improved due to elevation and limited mobility.  ?            --educated on importance of low salt diet and elevation when seated.   ?            --continue protein supplement for likely low protein stores ?            --continue compression stockings or ace wrap to prevent worsening of edema.  ? 4/20- Edema looks better this AM- 2+ edema- con't TEDs.  ? 16. Wounds ? 4/20- on low air loss mattress- pt said not as big as one upstairs- explained they are different hospital and already on bariatric low air loss mattress that we have-  ? ? ? ?I spent a total of 55   minutes on total care today- >50% coordination of care- due to 45 minutes with pt per pt request/concerns as detailed above-  ? ?LOS: ?1 days ?A FACE TO FACE EVALUATION WAS PERFORMED ? ?Garnette Greb ?04/08/2022, 1:48 PM  ? ? ? ?

## 2022-04-08 NOTE — Evaluation (Signed)
Occupational Therapy Assessment and Plan ? ?Patient Details  ?Name: Stacy Hanson ?MRN: [id number] ?Date of Birth: 07-25-1951 ? ?OT Diagnosis: acute pain, muscle weakness (generalized), and other lymphedema ?Rehab Potential: Rehab Potential (ACUTE ONLY): Fair ?ELOS: ~ 2-3 weeks  ? ?Today's Date: 04/08/2022 ?OT Individual Time: [id number] ?OT Individual Time Calculation (min): 60 min    ? ?Hospital Problem: Principal Problem: ?  Debility ? ? ?Past Medical History:  ?Past Medical History:  ?Diagnosis Date  ? Anemia   ? Arthritis   ? Atrial fibrillation ([place])   ? CHF (congestive heart failure) ([place])   ? Diabetes mellitus without complication ([place])   ? Dysrhythmia   ? GIB (gastrointestinal bleeding)   ? Hematochezia   ? Hypertension   ? Lymphedema   ? Morbid obesity ([place])   ? Neuromuscular disorder ([place])   ? Sleep apnea   ? Venous insufficiency of right leg   ? ?Past Surgical History:  ?Past Surgical History:  ?Procedure Laterality Date  ? JOINT REPLACEMENT    ? TOTAL KNEE ARTHROPLASTY    ? UMBILICAL HERNIA REPAIR    ? ? ?Assessment & Plan ?Clinical Impression: Patient is a 71 y.o. year old female  with history of diastolic CHF with DOE,  lymph edema w/BLE and abdominal wounds (managed by lymphedema clinic, pumps and wound center), morbid obesity BMI- 60, OSA- declined CPAP, IFG, iron deficiency anemia, A fib with reports of melanotic stools despite d/c of Eliquis 03/05/22. She was admitted to [facility] on 03/07/22 with fatigue, anemia and fall off the toilet and inability to get up as was stuck between the wall and the commode.  She reported diffuse pain with inability to use LUE>RUE. Right hip films negative for fracture. She was transfused with 4 units PRBC due to severe anemia- Hgb 5.6  and underwent EGD 03/20 revealing clean based gastric and duodenal ulcer with flat pigment spot v/s vessel with was treated with endoclip by Dr. Karolee Ohs with recs to follow up in 8 weeks for repeat study. She was found  to have enterococcus fecalis bacteremia and was started on IV antibiotics but continued to have fevers with rise in WBC.  ?  ?She had extensive work up--CT abdomen pelvis showed small bilateral pleural effusions with atelectasis and large amount of inflammatory stranding involving pannus and lateral abdominal walls concerning for possible cellulitis. TTE showed EF 65-70% with mild to moderate biventricular dilatation, aortic sclerosis with trace regurgitation. CT left shoulder negative for dislocation or fracture and trace left pleural effusion. TEE ordered but not done due to patient's inability to lie flat. Antibiotic covered broadened to IV ampicillin and ceftriaxone 2 grams every 14 hours for 6 weeks --last negative culture 03/26-->end date of 04/25/22.Fluid overload managed with lasix drip due to hypotension  as well as FR 1500 cc with recs to continue infusion as well as diuretics.  ?  ?She continued to have issues with hypoxia requiring supplemental oxygen per [place], diffuse pain as well as weakness. She was discharged to [facility] for [facility] on 03/18/22. She was transitioned to po lasix by 04/03 and aldactone added on 04/15. FR advanced to 2000 cc/day. She was transfused with one unit PRBC on 04/01 for Hgb 7.1 with improvement to low to mid 8 range.  Constipation treated with multiple  laxatives. Patient encouraged to utilize BIPAP with 2 Liters bled in to help manage hypoxia. Sr. BLE wounds resolved and now being treated with tegaderm, bilateral groin,pannus/perineum with local care, zinc/methol cream and  sacral wound with foam dressing. Respiratory status improving. Has decreased range of motion in both shoulders.  ?  Patient transferred to CIR on 04/07/2022 .   ? ?Patient currently requires total A with 2nd helper with  all ADL tasks and functional bed mobility, sit to stands and at time of eval was unable to transfer to chair  secondary to muscle weakness and significant decr functional use of UEs, decreased  cardiorespiratoy endurance and decreased oxygen support, and decreased sitting balance, decreased standing balance, and decreased balance strategies.  Prior to hospitalization, patient could complete ADL with modified independent . ? ?Patient will benefit from skilled intervention to decrease level of assist with basic self-care skills and increase independence with basic self-care skills prior to discharge home with care partner.  Anticipate patient will require moderate physical assestance and follow up home health. ? ?OT - End of Session ?Activity Tolerance: Tolerates 10 - 20 min activity with multiple rests ?Endurance Deficit: Yes ?Endurance Deficit Description: requires frequent rest breaks with all activity ?OT Assessment ?Rehab Potential (ACUTE ONLY): Fair ?OT Patient demonstrates impairments in the following area(s): Balance;Pain;Safety;Edema;Sensory;Endurance;Motor;Skin Integrity ?OT Basic ADL's Functional Problem(s): Grooming;Bathing;Dressing;Toileting ?OT Transfers Functional Problem(s): Toilet;Tub/Shower ?OT Additional Impairment(s): Fuctional Use of Upper Extremity;None ?OT Plan ?OT Intensity: Minimum of 1-2 x/day, 45 to 90 minutes ?OT Frequency: 5 out of 7 days ?OT Duration/Estimated Length of Stay: ~ 2-3 weeks ?OT Treatment/Interventions: Balance/vestibular training;Discharge planning;Pain management;Self Care/advanced ADL retraining;Therapeutic Activities;UE/LE Coordination activities;Disease mangement/prevention;Functional mobility training;Patient/family education;Skin care/wound managment;Therapeutic Exercise;Community reintegration;DME/adaptive equipment instruction;Psychosocial support;UE/LE Strength taining/ROM;Wheelchair propulsion/positioning ?OT Self Feeding Anticipated Outcome(s): n/a ?OT Basic Self-Care Anticipated Outcome(s): min to mod A ?OT Toileting Anticipated Outcome(s): max A 1/3 steps ?OT Bathroom Transfers Anticipated Outcome(s): min A ?OT Recommendation ?Recommendations for Other  Services: Neuropsych consult ?Patient destination: Home ?Follow Up Recommendations: Home health OT;24 hour supervision/assistance ?Equipment Recommended: To be determined ? ? ?OT Evaluation ?Precautions/Restrictions  ?Precautions ?Precautions: Fall ?Restrictions ?Weight Bearing Restrictions: No ?General ?Chart Reviewed: Yes ?Family/Caregiver Present: Yes (husband Legrand Como) ?Vital Signs ? ?Pain ?Pain Assessment ?Pain Scale: 0-10 ?Pain Score: 5  ?Pain Intervention(s): Medication (See eMAR) pain in feet with shoes and contact with floor- allowed for rest breaks  ?Home Living/Prior Functioning ?Home Living ?Family/patient expects to be discharged to:: Private residence ?Living Arrangements: Spouse/significant other ?Available Help at Discharge: Family, Available 24 hours/day ?Type of Home: House ?Home Access: Level entry ?Home Layout: One level ?Bathroom Shower/Tub: Other (comment) ?Bathroom Toilet: Standard ?Bathroom Accessibility: No ? Lives With: Spouse ?Vision ?Baseline Vision/History: 1 Wears glasses ?Ability to See in Adequate Light: 0 Adequate ?Patient Visual Report: No change from baseline ?Vision Assessment?: No apparent visual deficits ?Perception  ?Perception: Within Functional Limits ?Praxis ?Praxis: Intact ?Cognition ?Cognition ?Overall Cognitive Status: Within Functional Limits for tasks assessed ?Arousal/Alertness: Awake/alert ?Orientation Level: Person;Place;Situation ?Person: Oriented ?Place: Oriented ?Situation: Oriented ?Memory: Appears intact ?Attention: Focused;Sustained;Selective ?Focused Attention: Appears intact ?Sustained Attention: Appears intact ?Selective Attention: Appears intact ?Awareness: Appears intact ?Problem Solving: Appears intact ?Safety/Judgment: Appears intact ?Brief Interview for Mental Status (BIMS) ?Repetition of Three Words (First Attempt): 3 ?Temporal Orientation: Year: Correct ?Temporal Orientation: Month: Accurate within 5 days ?Temporal Orientation: Day: Correct ?Recall:  "Sock": Yes, no cue required ?Recall: "Blue": Yes, no cue required ?Recall: "Bed": Yes, no cue required ?BIMS Summary Score: 15 ?Sensation ?Sensation ?Light Touch: Impaired Detail ?Peripheral sensatio

## 2022-04-09 DIAGNOSIS — R5381 Other malaise: Secondary | ICD-10-CM | POA: Diagnosis not present

## 2022-04-09 NOTE — Progress Notes (Signed)
?                                                       PROGRESS NOTE ? ? ?Subjective/Complaints: ? ?Pt reports just had dressing on PICC changed.  ?Thinks Zofran caused her to be dizzy- explained since had Zofran multiple times, less likely Zofran and more likely coincidental- ?Also got tired real fast yesterday- thinks due to anemia- explained it takes 3-5 days to recover for 1 day in hospital- so will take time.  ? ? ?ROS: ? ?Pt denies SOB, abd pain, CP, N/V/C/D, and vision changes ? ? ?Objective: ?  ?VAS [place] LOWER EXTREMITY VENOUS (DVT) ? ?Result Date: 04/08/2022 ? Lower Venous DVT Study Patient Name:  Stacy Hanson  Date of Exam:   04/08/2022 Medical Rec #: [id number]          Accession #:    [id number] Date of Birth: [birth year]          Patient Gender: F Patient Age:   71 years Exam Location:  [facility] Procedure:      VAS [place] LOWER EXTREMITY VENOUS (DVT) Referring Phys: PAMELA LOVE --------------------------------------------------------------------------------  Indications: Edema.  Risk Factors: Immobility, history of a-fib, CHF. Limitations: Body habitus. Comparison Study: No prior studies. Performing Technologist: Jean Rosenthal RDMS, RVT  Examination Guidelines: A complete evaluation includes B-mode imaging, spectral Doppler, color Doppler, and power Doppler as needed of all accessible portions of each vessel. Bilateral testing is considered an integral part of a complete examination. Limited examinations for reoccurring indications may be performed as noted. The reflux portion of the exam is performed with the patient in reverse Trendelenburg.  +---------+---------------+---------+-----------+----------+--------------+ RIGHT    CompressibilityPhasicitySpontaneityPropertiesThrombus Aging +---------+---------------+---------+-----------+----------+--------------+ CFV      Full           Yes      Yes                                  +---------+---------------+---------+-----------+----------+--------------+ SFJ      Full                                                        +---------+---------------+---------+-----------+----------+--------------+ FV Prox  Full                                                        +---------+---------------+---------+-----------+----------+--------------+ FV Mid   Full                                                        +---------+---------------+---------+-----------+----------+--------------+ FV DistalFull                                                        +---------+---------------+---------+-----------+----------+--------------+  PFV      Full                                                        +---------+---------------+---------+-----------+----------+--------------+ POP      Full           Yes      Yes                                 +---------+---------------+---------+-----------+----------+--------------+ PTV      Full                                                        +---------+---------------+---------+-----------+----------+--------------+ PERO     Full                                                        +---------+---------------+---------+-----------+----------+--------------+ Gastroc  Full                                                        +---------+---------------+---------+-----------+----------+--------------+   +---------+---------------+---------+-----------+----------+--------------+ LEFT     CompressibilityPhasicitySpontaneityPropertiesThrombus Aging +---------+---------------+---------+-----------+----------+--------------+ CFV      Full           Yes      Yes                                 +---------+---------------+---------+-----------+----------+--------------+ SFJ      Full                                                         +---------+---------------+---------+-----------+----------+--------------+ FV Prox  Full           Yes      Yes                                 +---------+---------------+---------+-----------+----------+--------------+ FV Mid   Full                                                        +---------+---------------+---------+-----------+----------+--------------+ FV DistalFull                                                        +---------+---------------+---------+-----------+----------+--------------+  PFV                                                   Not visualized +---------+---------------+---------+-----------+----------+--------------+ POP      Full           Yes      Yes                                 +---------+---------------+---------+-----------+----------+--------------+ PTV      Full                                                        +---------+---------------+---------+-----------+----------+--------------+ PERO     Full                                                        +---------+---------------+---------+-----------+----------+--------------+ Gastroc  Full                                                        +---------+---------------+---------+-----------+----------+--------------+     Summary: RIGHT: - There is no evidence of deep vein thrombosis in the lower extremity.  - No cystic structure found in the popliteal fossa.  LEFT: - There is no evidence of deep vein thrombosis in the lower extremity. However, portions of this examination were limited- see technologist comments above.  - No cystic structure found in the popliteal fossa.  *See table(s) above for measurements and observations. Electronically signed by Sherald Hesshristopher Clark MD on 04/08/2022 at 8:47:51 PM.    Final    ? ?Recent Labs  ?  04/08/22 ?0603  ?WBC 7.7  ?HGB 7.7*  ?HCT 26.8*  ?PLT 314  ? ?Recent Labs  ?  04/08/22 ?0603  ?NA 141  ?K 4.2  ?CL 99  ?CO2 36*   ?GLUCOSE 94  ?BUN 25*  ?CREATININE 1.31*  ?CALCIUM 8.7*  ? ? ?Intake/Output Summary (Last 24 hours) at 04/09/2022 1518 ?Last data filed at 04/09/2022 1428 ?Gross per 24 hour  ?Intake 1022 ml  ?Output --  ?Net 1022 ml  ?  ? ?  ? ?Physical Exam: ?Vital Signs ?Blood pressure (!) 127/53, pulse 82, temperature 98.2 ?F (36.8 ?C), temperature source Oral, resp. rate 18, height 5\' 3"  (1.6 m), weight (!) 142.9 kg, SpO2 94 %. ? ? ? ? ?General: awake, alert, appropriate, BMI 55+; sitting up slightly in bed; no husband today; in low air loss mattress; NAD ?HENT: conjugate gaze; oropharynx moist- O2 via [place]- 2L-  ?CV: regular rate; no JVD ?Pulmonary: CTA B/L; no W/R/R- good air movement ?GI: soft, NT, ND, (+)BS- protuberant;  ?Psychiatric: appropriate- talkative ?Neurological: Ox3- delayed responses ?Musculoskeletal:  ?   Comments: Limited ROM bilateral shoulder with stiffness and crepitus left elbow with attempts at ROM. No pain with PROM left shoulder.  1+ tibially and 2+ pedally. Tegaderm on shins bilaterally.  Diffuse weakness.  ?Skin: ?   General: Skin is warm and dry.  ?Wounds with tegaderm on B/L ankles ? ? ?Assessment/Plan: ?1. Functional deficits which require 3+ hours per day of interdisciplinary therapy in a comprehensive inpatient rehab setting. ?Physiatrist is providing close team supervision and 24 hour management of active medical problems listed below. ?Physiatrist and rehab team continue to assess barriers to discharge/monitor patient progress toward functional and medical goals ? ?Care Tool: ? ?Bathing ?   ?Body parts bathed by patient: Left upper leg, Right upper leg, Face  ? Body parts bathed by helper: Right arm, Left arm, Chest, Abdomen, Front perineal area, Buttocks, Right lower leg, Left lower leg ?  ?  ?Bathing assist Assist Level: Total Assistance - Patient < 25% ?  ?  ?Upper Body Dressing/Undressing ?Upper body dressing   ?What is the patient wearing?: Pull over shirt ?   ?Upper body assist Assist Level:  Total Assistance - Patient < 25% ?   ?Lower Body Dressing/Undressing ?Lower body dressing ? ? ?   ?What is the patient wearing?: Incontinence brief ? ?  ? ?Lower body assist Assist for lower body dressing: 2 Helpers ?   ? ?Toileting ?Toileting Toileting Activity did not occur Press photographer and hygiene only):  (incontient)  ?Toileting assist Assist for toileting: Dependent - Patient 0% ?  ?  ?Transfers ?Chair/bed transfer ? ?Transfers assist ? Chair/bed

## 2022-04-09 NOTE — Care Management (Signed)
Inpatient Rehabilitation Center ?Individual Statement of Services ? ?Patient Name:  Stacy Hanson  ?Date:  04/09/2022 ? ?Welcome to the [facility].  Our goal is to provide you with an individualized program based on your diagnosis and situation, designed to meet your specific needs.  With this comprehensive rehabilitation program, you will be expected to participate in at least 3 hours of rehabilitation therapies Monday-Friday, with modified therapy programming on the weekends. ? ?Your rehabilitation program will include the following services:  Physical Therapy (PT), Occupational Therapy (OT), 24 hour per day rehabilitation nursing, Therapeutic Recreaction (TR), Psychology, Neuropsychology, Care Coordinator, Rehabilitation Medicine, Nutrition Services, Pharmacy Services, and Other ? ?Weekly team conferences will be held on Tuesdays to discuss your progress.  Your Inpatient Rehabilitation Care Coordinator will talk with you frequently to get your input and to update you on team discussions.  Team conferences with you and your family in attendance may also be held. ? ?Expected length of stay: 21-26 days ? ?Overall anticipated outcome: Moderate Assistance ? ?Depending on your progress and recovery, your program may change. Your Inpatient Rehabilitation Care Coordinator will coordinate services and will keep you informed of any changes. Your Inpatient Rehabilitation Care Coordinator's name and contact numbers are listed  below. ? ?The following services may also be recommended but are not provided by the [facility]:  ?Driving Evaluations ?Home Health Rehabiltiation Services ?Outpatient Rehabilitation Services ?Vocational Rehabilitation ?  ?Arrangements will be made to provide these services after discharge if needed.  Arrangements include referral to agencies that provide these services. ? ?Your insurance has been verified to be:  Medicare A/B ? ?Your primary doctor is:  No  PCP listed ? ?Pertinent information will be shared with your doctor and your insurance company. ? ?Inpatient Rehabilitation Care Coordinator:  Cathleen Corti [phone] or (C) [phone] ? ?Information discussed with and copy given to patient by: Rana Snare, 04/09/2022, 2:13 PM    ?

## 2022-04-09 NOTE — Consult Note (Signed)
[facility] Nurse Consult Note: ?Patient receiving care in [facility] [phone], transferred from [facility] in which the patients BLE wounds were managed with 40M Tegaderm Absorbent Clear Acrylic Dressings that can be left on for up to 28 days. See information on the web site link below.  ?Reason for Consult: bilateral LE dressing questions.  ?Wound type: venous stasis ulcers on the BLE with hemosiderin staining.  ?Pressure Injury POA: NA ?Dressing procedure/placement/frequency: ?Patient states that according to the [place] nurse at [facility], this dressing should stay on for 21 days and it should be a 3 part dressing meaning this is her second application and after the 21 days which should be up around May 5th at which time the dressings my be removed. I would recommend washing the legs with soap and water, dry thoroughly and then replace the dressings (not in [place] so they would need to come from the patient home supply) for 21 more days and this would be the third and last application.  ? ?According to the manufacturer 40M, this dressing can stay on up to 28 days. Gives excellent absorption with moderate exudate, is waterproof and allows monitoring of the wound without disturbing the incision or wound.  ?  ?https://www.41m[place].ca/40M/en_[place]/medical-ca/products/advanced-wound-care/tegaderm-clear/#:~:text=See%20how%2040M%E2%84%A2%20Tegaderm,to%20other%2040M%E2%84%A2%20Dressings&text=**%20May%20be%20left%20in,as%20indicated%20by%20clinical%20practice. ? ?Monitor the wound area(s) for worsening of condition such as: ?Signs/symptoms of infection, increase in size, development of or worsening of odor, ?development of pain, or increased pain at the affected locations.   ?Notify the medical team if any of these develop. ? ?Thank you for the consult. [place] [facility] nurse will not follow at this time.   ?Please re-consult the [facility] team if needed. ? ?Cathlean Marseilles. Tamala Julian, MSN, RN, CMSRN, AGCNS, WTA ?Wound Treatment Associate ?Pager [phone]    ? ?
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
## 2022-04-09 NOTE — Progress Notes (Signed)
Physical Therapy Session Note ? ?Patient Details  ?Name: Stacy Hanson ?MRN: [id number] ?Date of Birth: 1951/10/01 ? ?Today's Date: 04/09/2022 ?PT Individual Time: 2952-8413 and 2440-1027 ?PT Individual Time Calculation (min): 78 min and 75 min ? ?Short Term Goals: ?Week 1:  PT Short Term Goal 1 (Week 1): Pt will perform bed mobility wiht max assist of 1. ?PT Short Term Goal 2 (Week 1): Pt will tolerate sitting in recliner for >2 hours throughout the day. ?PT Short Term Goal 3 (Week 1): Pt will transfer out of bed with max assist for safety. ? ?Skilled Therapeutic Interventions/Progress Updates: Pt presented in bed hand off from [place] [facility] OT agreeable to therapy. Session to focus on bed mobility and attempts at standing for progression of functional mobility. Prior to OT leaving room discussion on how pt mobilized to get EOB. Pt was also able to talkback to PTA regarding sequencing of activity. Pt provided with sheet for RLE and gait belt looped for LLE. With significant increased time pt was able to shift BLE one by one to R side of bed with RLE 50% off bed. Pt then required maxA x 2 to complete transfer to EOB with pt holding bed rail with RUE. Pt was able to maintain sitting EOB with supervision. Pt then practiced forward weight shifting while holding onto heavy duty bari walker. Pt initiated forward weight shifting for BLE activation and was able to progress anterior weight shifting with increased repititions. Pt ultimately attempted stand with maxA x 2. Pt was able to maintain standing for ~15 seconds prior to pt stating knees felt like buckling. Pt returned to sitting and with verbal cues and assist of 2 was able to stand brief again to reposition at EOB. Pt required total A x 2 for sit to supine. Pt indicated incontinent BM and was able to perform rolling with maxA x 1 to L and maxA x 2 to R with total A for peri-care. PTA then notified that pt's new bed arrived and nsg asking for assistance for transfer to new  bed. Pt performed rolling L/R in same manner as prior to allow maxi-slide sling placement under pt. Pt then received dependent lateral slide to new bed (requiring x 6). Pt left in new bed with nsg present getting pt situated in new bed.  ? ?Tx2:Pt presented in bed agreeable to therapy. Pt states some pain in BLE but not rates. Pt noted to have slid down in bed therefore PTA assisted pt with +2 assist for scoot to [facility]. Pt noted to sill have maxi slide sling under pt. Pt performed rolling L/R with maxA x 1. PTA facilitated at hips and shoulder when rolling to R as pt unable to reach with LUE to bedrail. WHen rolling to L pt able to place RLE in hooklying and push into bed with PTA facilitating at hip. Pt then participated in BLE ROM as well as AA heel slides and AA LTR both with PTA assist then with use of small physioball for mobilization of low back. Pt indicated some relief after activity of low back however indicated increased pain in R hip which pt indicated felt like hadn't been moved "in a while". Auria LSW in room briefly for introduction with PTA setting up "achievement board" during this time. Pt noted to have soiled brief despite use of purewick. Pt then performed rolling L/R in same manner as prior with +2 assistance for peri-care. Pt was dependent x 3 for scoot to [facility]. Pt left in bed at  end of session with bed alarm on, call bell within reach and needs met.  ? ? ?Therapy Documentation ?Precautions:  ?Precautions ?Precautions: Fall ?Restrictions ?Weight Bearing Restrictions: No ?General: ?  ?Vital Signs: ? ?Pain: ?  ?Mobility: ?  ?Locomotion : ?   ?Trunk/Postural Assessment : ?   ?Balance: ?  ?Exercises: ?  ?Other Treatments:   ? ? ? ?Therapy/Group: Individual Therapy ? ?Sumire Halbleib ?04/09/2022, 12:31 PM  ?

## 2022-04-10 DIAGNOSIS — R5381 Other malaise: Secondary | ICD-10-CM | POA: Diagnosis not present

## 2022-04-10 NOTE — Progress Notes (Signed)
Pt refuses to wear SCDs for VTE prophylaxis. Pt educated on risk/benefits of wearing SCDs. Pt states " I understand, I do not want to wear them tonight".  ?

## 2022-04-10 NOTE — IPOC Note (Signed)
Overall Plan of Care (IPOC) ?Patient Details ?Name: Stacy Hanson ?MRN: [id number] ?DOB: 03/10/1951 ? ?Admitting Diagnosis: Debility ? ?Hospital Problems: Principal Problem: ?  Debility ? ? ? ? Functional Problem List: ?Nursing Bladder, Bowel, Edema, Endurance, Medication Management, Motor, Pain, Safety, Skin Integrity  ?PT Balance, Edema, Endurance, Motor, Pain, Safety, Sensory, Skin Integrity  ?OT Balance, Pain, Safety, Edema, Sensory, Endurance, Motor, Skin Integrity  ?SLP    ?TR    ?    ? Basic ADL?s: ?OT Grooming, Bathing, Dressing, Toileting  ? ?  Advanced  ADL?s: ?OT    ?   ?Transfers: ?PT Bed Mobility, Bed to Chair, Car, Furniture  ?OT Toilet, Tub/Shower  ? ?  Locomotion: ?PT Ambulation, Wheelchair Mobility, Stairs  ? ?  Additional Impairments: ?OT Fuctional Use of Upper Extremity, None  ?SLP   ?  ?   ?TR    ? ? ?Anticipated Outcomes ?Item Anticipated Outcome  ?Self Feeding n/a  ?Swallowing ?   ?  ?Basic self-care ? min to mod A  ?Toileting ? max A 1/3 steps ?  ?Bathroom Transfers min A  ?Bowel/Bladder ? mod assist  ?Transfers ? Min-mod assist transfers  ?Locomotion ? mod gait short distances  ?Communication ?    ?Cognition ?    ?Pain ? < 3  ?Safety/Judgment ? mod assist  ? ?Therapy Plan: ?PT Intensity: Minimum of 1-2 x/day ,45 to 90 minutes ?PT Frequency: 5 out of 7 days ?PT Duration Estimated Length of Stay: 24-26 days ?OT Intensity: Minimum of 1-2 x/day, 45 to 90 minutes ?OT Frequency: 5 out of 7 days ?OT Duration/Estimated Length of Stay: ~ 2-3 weeks ?   ? ?Due to the current state of emergency, patients may not be receiving their 3-hours of Medicare-mandated therapy. ? ? Team Interventions: ?Nursing Interventions Patient/Family Education, Bladder Management, Bowel Management, Disease Management/Prevention, Pain Management, Medication Management, Skin Care/Wound Management, Discharge Planning  ?PT interventions Ambulation/gait training, Warden/ranger, Cognitive remediation/compensation,  Disease management/prevention, Discharge planning, Community reintegration, DME/adaptive equipment instruction, Functional mobility training, Patient/family education, Pain management, Neuromuscular re-education, Therapeutic Activities, Psychosocial support, Skin care/wound management, Splinting/orthotics, Therapeutic Exercise, Stair training, UE/LE Strength taining/ROM, UE/LE Coordination activities, Visual/perceptual remediation/compensation, Wheelchair propulsion/positioning  ?OT Interventions Balance/vestibular training, Discharge planning, Pain management, Self Care/advanced ADL retraining, Therapeutic Activities, UE/LE Coordination activities, Disease mangement/prevention, Functional mobility training, Patient/family education, Skin care/wound managment, Therapeutic Exercise, Community reintegration, DME/adaptive equipment instruction, Psychosocial support, UE/LE Strength taining/ROM, Wheelchair propulsion/positioning  ?SLP Interventions    ?TR Interventions    ?SW/CM Interventions Discharge Planning, Psychosocial Support, Patient/Family Education  ? ?Barriers to Discharge ?MD  Medical stability, Home enviroment access/loayout, Incontinence, Neurogenic bowel and bladder, Wound care, Lack of/limited family support, Weight, Medication compliance, and Behavior  ?Nursing Decreased caregiver support, Home environment access/layout, Incontinence, IV antibiotics, Wound Care, Lack of/limited family support, Weight, Weight bearing restrictions ?1 level, level entry. Discharging with spouse. Spouse limitations min guard mobility, max assist ADL's. Family can provide assist when needed.  ?PT Inaccessible home environment, Decreased caregiver support, Home environment access/layout, Incontinence, Weight ?   ?OT   ?   ?SLP   ?   ?SW   ?   ? ?Team Discharge Planning: ?Destination: PT-Home ,OT- Home , SLP-  ?Projected Follow-up: PT-Home health PT, OT-  Home health OT, 24 hour supervision/assistance, SLP-  ?Projected  Equipment Needs: PT-To be determined, Wheelchair (measurements), Wheelchair cushion (measurements), OT- To be determined, SLP-  ?Equipment Details: PT- , OT-  ?Patient/family involved in discharge planning: PT- Patient,  OT-Patient, Family  member/caregiver, SLP-  ? ?MD ELOS: 2.5 to 3 weeks ?Medical Rehab Prognosis:  Fair ?Assessment:  ?The patient has been admitted for CIR therapies with the diagnosis of debility due to complicated medical issues. The team will be addressing functional mobility, strength, stamina, balance, safety, adaptive techniques and equipment, self-care, bowel and bladder mgt, patient and caregiver education, skin issues. Goals have been set at min A. Anticipated discharge destination is home. ? ?Due to the current state of emergency, patients may not be receiving their 3 hours per day of Medicare-mandated therapy.  ? ? ? ? ? ?See Team Conference Notes for weekly updates to the plan of care ? ?

## 2022-04-10 NOTE — Progress Notes (Signed)
?                                                       PROGRESS NOTE ? ? ?Subjective/Complaints: ? ?Pt upset that gets "stuck" in bed a lot because of inflation and deflation.  ?Makes her claustrophobia bad.  ?LBM yesterday- incontinent of stool. ?Also incontinent of bladder.  ?Rehab going well but makes her anxious because staff "small".  ?  ? ?ROS: ? ?Pt denies SOB, abd pain, CP, N/V/C/D, and vision changes ? ? ?Objective: ?  ?No results found. ? ?Recent Labs  ?  04/08/22 ?0603  ?WBC 7.7  ?HGB 7.7*  ?HCT 26.8*  ?PLT 314  ? ?Recent Labs  ?  04/08/22 ?0603  ?NA 141  ?K 4.2  ?CL 99  ?CO2 36*  ?GLUCOSE 94  ?BUN 25*  ?CREATININE 1.31*  ?CALCIUM 8.7*  ? ? ?Intake/Output Summary (Last 24 hours) at 04/10/2022 1626 ?Last data filed at 04/10/2022 1539 ?Gross per 24 hour  ?Intake 970.93 ml  ?Output 2700 ml  ?Net -1729.07 ml  ?  ? ?  ? ?Physical Exam: ?Vital Signs ?Blood pressure (!) 138/53, pulse 73, temperature 97.8 ?F (36.6 ?C), temperature source Oral, resp. rate 16, height 5\' 3"  (1.6 m), weight (!) 136.5 kg, SpO2 96 %. ? ? ? ? ? ?General: awake, alert, appropriate, supine in low loss air mattess- bariatric; NAD ?HENT: conjugate gaze; oropharynx moist ?CV: regular rate; no JVD ?Pulmonary: CTA B/L; no W/R/R- good air movement ?GI: soft, NT, ND, (+)BS- BMI 53.32 ?Psychiatric: appropriate ?Neurological: Ox3- delayed responses-  ?Musculoskeletal:  ?   Comments: Limited ROM bilateral shoulder with stiffness and crepitus left elbow with attempts at ROM. No pain with PROM left shoulder.  1+ tibially and 2+ pedally. Tegaderm on shins bilaterally.  Diffuse weakness.  ?Skin: ?   General: Skin is warm and dry.  ?Wounds with tegaderm on B/L ankles ? ? ?Assessment/Plan: ?1. Functional deficits which require 3+ hours per day of interdisciplinary therapy in a comprehensive inpatient rehab setting. ?Physiatrist is providing close team supervision and 24 hour management of active medical problems listed below. ?Physiatrist and rehab team  continue to assess barriers to discharge/monitor patient progress toward functional and medical goals ? ?Care Tool: ? ?Bathing ?   ?Body parts bathed by patient: Left upper leg, Right upper leg, Face  ? Body parts bathed by helper: Right arm, Left arm, Chest, Abdomen, Front perineal area, Buttocks, Right lower leg, Left lower leg ?  ?  ?Bathing assist Assist Level: Total Assistance - Patient < 25% ?  ?  ?Upper Body Dressing/Undressing ?Upper body dressing   ?What is the patient wearing?: Pull over shirt ?   ?Upper body assist Assist Level: Total Assistance - Patient < 25% ?   ?Lower Body Dressing/Undressing ?Lower body dressing ? ? ?   ?What is the patient wearing?: Incontinence brief ? ?  ? ?Lower body assist Assist for lower body dressing: 2 Helpers ?   ? ?Toileting ?Toileting Toileting Activity did not occur Landscape architect and hygiene only):  (incontient)  ?Toileting assist Assist for toileting: 2 Helpers ?  ?  ?Transfers ?Chair/bed transfer ? ?Transfers assist ? Chair/bed transfer activity did not occur: Refused ? ?Chair/bed transfer assist level: Dependent - mechanical lift ?  ?  ?Locomotion ?Ambulation ? ? ?Ambulation  assist ? ? Ambulation activity did not occur: Safety/medical concerns ? ?  ?  ?   ? ?Walk 10 feet activity ? ? ?Assist ? Walk 10 feet activity did not occur: Safety/medical concerns ? ?  ?   ? ?Walk 50 feet activity ? ? ?Assist Walk 50 feet with 2 turns activity did not occur: Safety/medical concerns ? ?  ?   ? ? ?Walk 150 feet activity ? ? ?Assist Walk 150 feet activity did not occur: Safety/medical concerns ? ?  ?  ?  ? ?Walk 10 feet on uneven surface  ?activity ? ? ?Assist Walk 10 feet on uneven surfaces activity did not occur: Safety/medical concerns ? ? ?  ?   ? ?Wheelchair ? ? ? ? ?Assist Is the patient using a wheelchair?: Yes ?  ?Wheelchair activity did not occur: Refused ? ?  ?   ? ? ?Wheelchair 50 feet with 2 turns activity ? ? ? ?Assist ? ?  ?Wheelchair 50 feet with 2 turns  activity did not occur: Refused ? ? ?   ? ?Wheelchair 150 feet activity  ? ? ? ?Assist ? Wheelchair 150 feet activity did not occur: Refused ? ? ?   ? ?Blood pressure (!) 138/53, pulse 73, temperature 97.8 ?F (36.6 ?C), temperature source Oral, resp. rate 16, height 5\' 3"  (1.6 m), weight (!) 136.5 kg, SpO2 96 %. ? ?Medical Problem List and Plan: ?1. Functional deficits secondary to debility- for complicated medical stay-CHF exacerbation/bacteremia ?            -patient may shower ?            -ELOS/Goals: 14-18 days [facility] PT and OT ?            Continue CIR- PT, OT  ?2.  Antithrombotics: ?-DVT/anticoagulation:  Mechanical: Sequential compression devices, below knee Bilateral lower extremities ?4/21- Dopplers (-) for DVTs ? 4/22- pt refusing to use SCDs- educated needs them to reduce risk of DVTs ?            -antiplatelet therapy: N/A due to GIB ?3. Left hip/shoulder pain/myalgias/ Pain Management: oxycodone prn for diffuse pain. ?4/20- will schedule Tramadol 100 mg q8 hours not q6 hours due to renal issues- Cr 1.31-  con't oxycodone for pain prn- was taking NSAIDs at home and caused GI bleed.  ? 4/21- pain usually controlled with tramadol and oxycodone for severe pain ?4. Mood: LCSW to follow for evaluation and support.  ?            -antipsychotic agents: N/a ?5. Neuropsych: This patient is capable of making decisions on her own behalf. ?6. Skin/Wound Care: Pressure relief measures.  ?--Monitor for recurrent peripheral edema with increase in activity.  ?--modify FR as indicated. ?4/20- on low air loss mattress- con't that and 11M tegaderm for ankles B/L per WOC  ? 4/22- Pt doesn't like bed- was changed yesterday to completely new bed.  ?7. Fluids/Electrolytes/Nutrition: Monitor I/O. Check CMET in am.  ?8. Enterococcus fecalis bacteremia: On Ampicillin and ceftriaxone with end date 05/07 ?9. ABLA due to GIB:  ON PPI BID. Will add iron supplement with Miralax daily ?--check anemia panel as question Vitamin B12 and  iron deficiency.   ? 4/20- has iron deficiency ? 4/21- on IV Iron as well as PO ? 4/22- labs Monday ?10. CAF: Monitor HR TID--continue metoprolol. ?4/21- rate controlled- con't regimen  ?11. Chronic diastolic CHF/fluid overload: Heart healthy diet. Strict I/O. ?-- 2000 cc (IV+ PO)  FR--will change to 1000 cc po as getting IV every 4 hours.  ?            --monitor for orthostatic changes (due to prior hx) ?            --continue Lasix, metoprolol and spironolactone.  ? 4/20- Weight down to 143.8 kg- but changed bed- will monitor trend ? 4/21- Weight down 0.9 kg- will con't to monitor ? 4/22- bed changed, so likely why weight down 5 kg ?12. Restless leg syndrome: Continue with Requip 2 mg bid and 3 mg hs.  ?13. OSA/OHS: Hypercarbia noted but improved from 43-->35. ?-- Encourage BIPAP use as able to tolerate it for 1- 3 hours thorough the night.  ?--check ABG for baseline/BIPAP justification if needed at discharge  ?--Wean oxygen as tolerated. Encourage pulmonary hygiene.  ? 4/20- pt having husband bring in from home ? 4/21- pt didn't use las tnight- encouraged use ?14. Morbid obesity: BMI 60.77. Educate on appropriate diet to promote mobility and health.  ? 4/20- BMI down slightly to 56.15- but likely due to bed change ? 4/22- BMI 53- due to change of bed.  ?15. Peripheral edema: Greatly improved due to elevation and limited mobility.  ?            --educated on importance of low salt diet and elevation when seated.   ?            --continue protein supplement for likely low protein stores ?            --continue compression stockings or ace wrap to prevent worsening of edema.  ? 4/20- Edema looks better this AM- 2+ edema- con't TEDs.  ? 16. Wounds ? 4/20- on low air loss mattress- pt said not as big as one upstairs- explained they are different hospital and already on bariatric low air loss mattress that we have-  ?17. B/L LE wounds- venous stasis ulcers- not pressure ? 4/21- con't 1M tegaderm- can be left on for  21-28 days per WOC ? 4/22- pt wants to come off Bariatric air mattress- will see if possible- it appears only have bariatric beds in low loss air beds per nursing but they will double check.  ? ?I spent a total o
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
## 2022-04-11 NOTE — Progress Notes (Signed)
Occupational Therapy Session Note ? ?Patient Details  ?Name: Stacy Hanson ?MRN: [id number] ?Date of Birth: 08/12/1951 ? ?Today's Date: 04/11/2022 ?OT Individual Time: 4210-3128 and 1188-6773 ?OT Individual Time Calculation (min): 54 min and 28 min ? ? ?Short Term Goals: ?Week 1:  OT Short Term Goal 1 (Week 1): Pt will be able to dress UB with mod A at the EOB ?OT Short Term Goal 2 (Week 1): Pt will toleate sitting out of the bed for 2 hrs at a time ?OT Short Term Goal 3 (Week 1): Pt will be able to perform 2 grooming tasks with mod A ?OT Short Term Goal 4 (Week 1): Pt will transition to EOB in hospital bed with max A +1 ? ? ?Skilled Therapeutic Interventions/Progress Updates:  ?  Session 1: Pt greeted at time of session bed level resting on supplemental O2 at 2L throughout session via [place]. No pain at rest but does have BLE discomfort with movement, not rated but improved with positional change and rest. Pt initially hyperverbal and taking extended time discussing recent events leading to hospitalization and 2/2 nervousness. Order in for TED hose in place and spoke with nursing to confirm OK with BLE dressings, donned TEDS for additional compression and edema management. Donned gripper socks as well in prep for sitting. Supine <> sit MAX of 2, pt able to assist bringing RLE to edge and partial roll to R side to pull on bed rail to come up to sitting. MAX A for repositioning at EOB on air mattress with block under feet for support, able to tolerate sitting approx 15 minutes to work on core strength and sitting tolerance. Oral hygiene with MIN A for sitting balance at EOB. Returned to supine same as above and scooted up 2 helpers. Call bell in reach.  ? ?Session 2: Pt greeted at time of session bed level resting with spouse present who remained throughout session. Pt reporting numbness in RLE after having on TEDS (nursing had removed and aware), OT performing sensation testing on RLE and pt able to correctly identify  5/5 toes when lightly touched and able to identify when no touch present, states only on top of R foot with numbness which also gets worse with lymphedema at times. Focused on BLE AAROM/PROM for hip/knee flexion with stretch at end range for 1x10 each LE. OT facilitating AAROM/PROM for LUE at shoulder for flexion/extension, circumduction and pt able to notably achieve more with PROM > AROM. Pt left bed level all needs met call bell in reach. ? ?Therapy Documentation ?Precautions:  ?Precautions ?Precautions: Fall ?Restrictions ?Weight Bearing Restrictions: No ? ? ? ? ?Therapy/Group: Individual Therapy ? ?Viona Gilmore ?04/11/2022, 7:13 AM ?

## 2022-04-11 NOTE — Progress Notes (Signed)
Spoke with pt about BIPAP. Pt does have her home BIPAP, but has not been set up with correct settings/still in box. Asked if she is interested in using one of our machines for the time being. Pt states she does have history of claustrophobia, and is still trying to adjust to everything around her, and wants to hold off for now, would rather wait until she can try her machine and mask. Told pt maybe she could speak with case worker to see about arranging home health to come to hospital to assist with setting up her machine. I told her I wasn't sure if it could be done, but that it was worth asking about. Pt will continue with [place] for now. RT will monitor as needed.  ?

## 2022-04-11 NOTE — Progress Notes (Signed)
Physical Therapy Session Note ? ?Patient Details  ?Name: Stacy Hanson ?MRN: [id number] ?Date of Birth: 29-Jun-1951 ? ?Today's Date: 04/11/2022 ?PT Individual Time: 1000-1100, [id number] ?PT Individual Time Calculation (min): 60 min, 42 min  ? ?Short Term Goals: ?Week 1:  PT Short Term Goal 1 (Week 1): Pt will perform bed mobility wiht max assist of 1. ?PT Short Term Goal 2 (Week 1): Pt will tolerate sitting in recliner for >2 hours throughout the day. ?PT Short Term Goal 3 (Week 1): Pt will transfer out of bed with max assist for safety. ? ?Skilled Therapeutic Interventions/Progress Updates:  ?  Session 1: ?pt received in bed and agreeable to therapy. Pt reports 8/10 pain in her feet and calves, and 7/10 pain in hips, back, buttock, LUE. Pt requested to use urinal, required tot A for continent void, informed nsg of 250 ml void.  ? ?Pt then sat to EOB with max A, requiring sheet around trunk to for trunk management and gait belt/sheet to move BLE to EOB. Pt unable to sit EOB independently d/t edge of mattress not deflating and creating ledge that was difficult to maneuver. Pt then performed modified sit ups x 3 bouts with assist from sheet around trunk. Pt then performed LAQ x 2 bouts with assist to maintain upright sitting.  ?Extended rest breaks required d/t difficulty maintaining sitting balance on EOB.  ?Supine>sit with max A, scooting to [facility] with tot A x 2. Pt remained in bed with all needs in reach.  ? ?Session 2: ?pt received in bed and agreeable to therapy. Pt reports continued pain, especially in her L foot/calf. Max A x 2 for Supine>sit, pt able to move BLE to EOB with gait belt/sheet. Tot A x 2 to scoot to EOB in such a way that pt could maintain sitting balance. Session focused on Sit to stand with RW and max a x 2. Pt able to stand x 3 in this manner with max encouragement from therapist and pt's husband. Pt required extended seated rest breaks d/t anxiety and fatigue. Pt c/o numbness and pain in L foot,  but agreeable to continue with assurance that she is safe and would just sit down if her knees did buckle and would not fall. Discussed that pt would like to get to [facility], discussed options and suggested working with OT on this goal and that therapy goal would be to get into w/c tomorrow if possible. Pt returned to bed with max A x 2 and scooted to [facility] tot A x 2. was left with all needs in reach and her husband present.  ? ?Therapy Documentation ?Precautions:  ?Precautions ?Precautions: Fall ?Restrictions ?Weight Bearing Restrictions: No ?General: ?  ? ? ? ? ?Therapy/Group: Individual Therapy ? ?Stacy Hanson ?04/11/2022, 12:58 PM  ?

## 2022-04-11 NOTE — Progress Notes (Signed)
Pt states she has home machine but is waiting on home health company and case manager to set up a time where company can set up machine. She is willing to wear it but states she does not tolerate our machines/masks because of the difficulty of getting on and off.  ?

## 2022-04-12 DIAGNOSIS — D62 Acute posthemorrhagic anemia: Secondary | ICD-10-CM | POA: Diagnosis not present

## 2022-04-12 DIAGNOSIS — G4733 Obstructive sleep apnea (adult) (pediatric): Secondary | ICD-10-CM

## 2022-04-12 DIAGNOSIS — R5381 Other malaise: Secondary | ICD-10-CM | POA: Diagnosis not present

## 2022-04-12 LAB — CBC
HCT: 27.8 % — ABNORMAL LOW (ref 36.0–46.0)
Hemoglobin: 8.2 g/dL — ABNORMAL LOW (ref 12.0–15.0)
MCH: 25.9 pg — ABNORMAL LOW (ref 26.0–34.0)
MCHC: 29.5 g/dL — ABNORMAL LOW (ref 30.0–36.0)
MCV: 87.7 fL (ref 80.0–100.0)
Platelets: 256 10*3/uL (ref 150–400)
RBC: 3.17 MIL/uL — ABNORMAL LOW (ref 3.87–5.11)
RDW: 16.5 % — ABNORMAL HIGH (ref 11.5–15.5)
WBC: 7.4 10*3/uL (ref 4.0–10.5)
nRBC: 0 % (ref 0.0–0.2)

## 2022-04-12 LAB — BASIC METABOLIC PANEL
Anion gap: 5 (ref 5–15)
BUN: 14 mg/dL (ref 8–23)
CO2: 36 mmol/L — ABNORMAL HIGH (ref 22–32)
Calcium: 8.9 mg/dL (ref 8.9–10.3)
Chloride: 98 mmol/L (ref 98–111)
Creatinine, Ser: 0.96 mg/dL (ref 0.44–1.00)
GFR, Estimated: >60 mL/min (ref >60)
Glucose, Bld: 90 mg/dL (ref 70–99)
Potassium: 3.8 mmol/L (ref 3.5–5.1)
Sodium: 139 mmol/L (ref 135–145)

## 2022-04-12 NOTE — Progress Notes (Signed)
Occupational Therapy Session Note ? ?Patient Details  ?Name: Stacy Hanson ?MRN: [id number] ?Date of Birth: [birth year] ? ?Today's Date: 04/12/2022 ?OT Individual Time: 7564-3329 ?OT Individual Time Calculation (min): 70 min  ? ? ?Short Term Goals: ?Week 1:  OT Short Term Goal 1 (Week 1): Pt will be able to dress UB with mod A at the EOB ?OT Short Term Goal 2 (Week 1): Pt will toleate sitting out of the bed for 2 hrs at a time ?OT Short Term Goal 3 (Week 1): Pt will be able to perform 2 grooming tasks with mod A ?OT Short Term Goal 4 (Week 1): Pt will transition to EOB in hospital bed with max A +1 ? ?Skilled Therapeutic Interventions/Progress Updates:  ?  Pt resting in barichair upon arrival. OT intervention with focus on BUE AROM, semi crunches from semireclined position in cahir, discharge planning, and activity tolerance to increase independence with BADLs. Pt resting in chair at approx 75* and required BUE assistance to come to approx 90*. Pt engaged in resistive/isometric BUE therex with goal of increasing strength to assist with BADLs and functional tranfsers. Pt using Rollator prior to hospitalization and has walk in shower and walk in tub with seat. Pt c/o buttocks discomfort and repostioned in chair with use of MaxiSky. Pillows placed behind back to assist with postioning. Pt remained in chair to eat lunch. All needs within reach.  ? ?Therapy Documentation ?Precautions:  ?Precautions ?Precautions: Fall ?Restrictions ?Weight Bearing Restrictions: No ? ?Pain: ? Pt c/o LUE (mid humerus) pain with AROM but not PROM and buttocks pain (repositioned with use of maxisky) ? ?Therapy/Group: Individual Therapy ? ?Rich Brave ?04/12/2022, 12:05 PM ?

## 2022-04-12 NOTE — Progress Notes (Signed)
Physical Therapy Session Note ? ?Patient Details  ?Name: Stacy Hanson ?MRN: [id number] ?Date of Birth: 1951/06/21 ? ?Today's Date: 04/12/2022 ?PT Individual Time: 1007-1050 ?PT Individual Time Calculation (min): 43 min  ? ?Short Term Goals: ?Week 1:  PT Short Term Goal 1 (Week 1): Pt will perform bed mobility wiht max assist of 1. ?PT Short Term Goal 2 (Week 1): Pt will tolerate sitting in recliner for >2 hours throughout the day. ?PT Short Term Goal 3 (Week 1): Pt will transfer out of bed with max assist for safety. ? ?Skilled Therapeutic Interventions/Progress Updates:  ?   ?Patient in bed upon PT arrival. Patient alert and agreeable to PT session. Patient reported intermittent B lower extremity pain and sensitivity to touch during session, RN made aware. PT provided repositioning, rest breaks, and distraction as pain interventions throughout session.  ? ?Focused session on bed>chair transfer via [facility] lift for improved sitting and upright tolerance. Spent increased time discussing lift transfer and management strategies for patient's anxiety and claustrophobia with the transfer. Patient agreeable to trial lift transfer during session.  ? ?Patient performed rolling R/L x2 with mod A +2 with use of bed rails for lift sling placement. Initially lifted patient with lower limb straps uncrossed with increased patient discomfort, improved with straps crossed. Patient tolerated a dependent transfer bed>bariatric chair well with use of breathing techniques and reassurance from PT. Patient adjusted in the bariatric chair reclined with B lower extremities elevated. Patient reported improved comfort OOB at end of session. Sling left under patient for quick return to bed if needed and to reduce patient with sling removal and placement. Ensured sling was not folded or rolled and skin was covered by clothing or chuck pad to reduce risk of skin breakdown.  ? ?Patient in bariatric chair handed off to OT at end of session  with breaks locked and all needs within reach.  ? ?Therapy Documentation ?Precautions:  ?Precautions ?Precautions: Fall ?Restrictions ?Weight Bearing Restrictions: No ? ? ? ?Therapy/Group: Individual Therapy ? ?Helayne Seminole PT, DPT ? ?04/12/2022, 12:47 PM  ?

## 2022-04-12 NOTE — Progress Notes (Signed)
Patient ID: Stacy Hanson, female   DOB: 1951-01-25, 71 y.o.   MRN: [id number] ? ?SW received updates from [employer] who provided BiPAP settings for machine.  ? ?Diagnosis: Severe obstructive sleep apnea (G47.33) ? ?BiPAP pressure: 16/10 cmH20, Ramp: 20 minutes [ResMed AirCurve Auto- Bi-level machine [id number]) x 99 months, Heated Humidifier [id number]) x 99 months; Tag "[facility]" in AirView] ? ?[facility] ?[address] [place], [place] [address], [phone]; Fax: [phone] ? ?Physician- Diona Foley Moser (03/04/2022) ? ? ?[name], MSW, LCSWA ?Office: [phone] ?Cell: [phone] ?Fax: [phone]  ?

## 2022-04-12 NOTE — Progress Notes (Signed)
Physical Therapy Session Note ? ?Patient Details  ?Name: Stacy Hanson ?MRN: [id number] ?Date of Birth: 1951/02/09 ? ?Today's Date: 04/12/2022 ?PT Individual Time: 1300-1400 ?PT Individual Time Calculation (min): 60 min  ? ?Short Term Goals: ?Week 1:  PT Short Term Goal 1 (Week 1): Pt will perform bed mobility wiht max assist of 1. ?PT Short Term Goal 2 (Week 1): Pt will tolerate sitting in recliner for >2 hours throughout the day. ?PT Short Term Goal 3 (Week 1): Pt will transfer out of bed with max assist for safety. ? ?Skilled Therapeutic Interventions/Progress Updates: Pt presents reclined in bariatric recliner chair from AM session and requesting to return to bed 2/2 pain buttocks.  Pt returned to bed via [facility] mechanical lift.  Pt w/ soiled brief.  Pt rolled multiple times side to side w/ assist of 2 to initiate and verbal cues and mod A for hooklying position, but then pt able to use siderails to maintain sidelying.  Pt able to use LEs to assist scoot to [facility].  Pt agreeable to LE ther ex.  Pt performed AP, QS, GS 3 x 15 w/ verbal cues for maintaining breathing technique during isometrics.  Pt performed 3 x 10-15 abd/add w/ AAROM R > LLE, and HS w/ manually resisted hip/knee extension.  Pt required rest breaks 2/2 fatigue.  Pt remained in bed w/ all needs in reach. ?   ? ?Therapy Documentation ?Precautions:  ?Precautions ?Precautions: Fall ?Restrictions ?Weight Bearing Restrictions: No ?General: ?  ?Vital Signs: ?Therapy Vitals ?Temp: 98.2 ?F (36.8 ?C) ?Temp Source: Oral ?Pulse Rate: 79 ?Resp: 17 ?BP: (!) 121/52 ?Patient Position (if appropriate): Lying ?Oxygen Therapy ?SpO2: 93 % ?O2 Device: Room Air ?Pain:8/10 feet ?  ? ? ? ? ?Therapy/Group: Individual Therapy ? ?Ladoris Gene ?04/12/2022, 3:54 PM  ?

## 2022-04-12 NOTE — Progress Notes (Signed)
?                                                       PROGRESS NOTE ? ? ?Subjective/Complaints: ? ?Happy to be up out of bed. Doesn't like air mattress when she sinks down into it. Requests that pressure is kept high so that she can be "on top". Has generalized soreness from activities with therapy.  ? ?ROS: Patient denies fever, rash, sore throat, blurred vision, dizziness, nausea, vomiting, diarrhea, cough, shortness of breath or chest pain,   headache, or mood change.  ? ? ?Objective: ?  ?No results found. ? ?Recent Labs  ?  04/12/22 ?[id number]  ?WBC 7.4  ?HGB 8.2*  ?HCT 27.8*  ?PLT 256  ? ? ?Recent Labs  ?  04/12/22 ?[id number]  ?NA 139  ?K 3.8  ?CL 98  ?CO2 36*  ?GLUCOSE 90  ?BUN 14  ?CREATININE 0.96  ?CALCIUM 8.9  ? ? ? ?Intake/Output Summary (Last 24 hours) at 04/12/2022 1256 ?Last data filed at 04/12/2022 0702 ?Gross per 24 hour  ?Intake 920 ml  ?Output 775 ml  ?Net 145 ml  ?  ? ?  ? ?Physical Exam: ?Vital Signs ?Blood pressure (!) 152/85, pulse 71, temperature 98.2 ?F (36.8 ?C), temperature source Oral, resp. rate 18, height 5\' 3"  (1.6 m), weight 127 kg, SpO2 91 %. ? ? ? ? ? ? ?Constitutional: No distress . Vital signs reviewed. Morbidly obese ?HEENT: NCAT, EOMI, oral membranes moist ?Neck: supple ?Cardiovascular: RRR without murmur. No JVD    ?Respiratory/Chest: CTA Bilaterally without wheezes or rales. Normal effort    ?GI/Abdomen: BS +, non-tender, non-distended ?Ext: no clubbing, cyanosis, or edema ?Psych: pleasant and cooperative  ?Neurological: Ox3- alert. Normal insight and awareness. Moves all 4's.  ?Musculoskeletal:  ?   Comments: Limited ROM bilateral shoulder with stiffness and crepitus left elbow with attempts at ROM. No pain with PROM left shoulder.  1+ tibially and 2+ pedally.  .  Diffuse weakness.  ?Skin: ?   General: Skin is warm and dry.  ?Wounds with tegaderm on B/L ankles for chronic areas, both areas appear clean.  ? ? ?Assessment/Plan: ?1. Functional deficits which require 3+ hours per day of  interdisciplinary therapy in a comprehensive inpatient rehab setting. ?Physiatrist is providing close team supervision and 24 hour management of active medical problems listed below. ?Physiatrist and rehab team continue to assess barriers to discharge/monitor patient progress toward functional and medical goals ? ?Care Tool: ? ?Bathing ?   ?Body parts bathed by patient: Left upper leg, Right upper leg, Face  ? Body parts bathed by helper: Right arm, Left arm, Chest, Abdomen, Front perineal area, Buttocks, Right lower leg, Left lower leg ?  ?  ?Bathing assist Assist Level: Total Assistance - Patient < 25% ?  ?  ?Upper Body Dressing/Undressing ?Upper body dressing   ?What is the patient wearing?: Pull over shirt ?   ?Upper body assist Assist Level: Total Assistance - Patient < 25% ?   ?Lower Body Dressing/Undressing ?Lower body dressing ? ? ?   ?What is the patient wearing?: Incontinence brief ? ?  ? ?Lower body assist Assist for lower body dressing: 2 Helpers ?   ? ?Toileting ?Toileting Toileting Activity did not occur Landscape architect and hygiene only):  (incontient)  ?Toileting assist Assist for  toileting: 2 Helpers ?  ?  ?Transfers ?Chair/bed transfer ? ?Transfers assist ? Chair/bed transfer activity did not occur: Refused ? ?Chair/bed transfer assist level: Dependent - mechanical lift ?  ?  ?Locomotion ?Ambulation ? ? ?Ambulation assist ? ? Ambulation activity did not occur: Safety/medical concerns ? ?  ?  ?   ? ?Walk 10 feet activity ? ? ?Assist ? Walk 10 feet activity did not occur: Safety/medical concerns ? ?  ?   ? ?Walk 50 feet activity ? ? ?Assist Walk 50 feet with 2 turns activity did not occur: Safety/medical concerns ? ?  ?   ? ? ?Walk 150 feet activity ? ? ?Assist Walk 150 feet activity did not occur: Safety/medical concerns ? ?  ?  ?  ? ?Walk 10 feet on uneven surface  ?activity ? ? ?Assist Walk 10 feet on uneven surfaces activity did not occur: Safety/medical concerns ? ? ?  ?    ? ?Wheelchair ? ? ? ? ?Assist Is the patient using a wheelchair?: Yes ?Type of Wheelchair: Manual (Per PT) ?Wheelchair activity did not occur: Refused ? ?  ?   ? ? ?Wheelchair 50 feet with 2 turns activity ? ? ? ?Assist ? ?  ?Wheelchair 50 feet with 2 turns activity did not occur: Refused ? ? ?   ? ?Wheelchair 150 feet activity  ? ? ? ?Assist ? Wheelchair 150 feet activity did not occur: Refused ? ? ?   ? ?Blood pressure (!) 152/85, pulse 71, temperature 98.2 ?F (36.8 ?C), temperature source Oral, resp. rate 18, height 5\' 3"  (1.6 m), weight 127 kg, SpO2 91 %. ? ?Medical Problem List and Plan: ?1. Functional deficits secondary to debility- for complicated medical stay-CHF exacerbation/bacteremia ?            -patient may shower ?            -ELOS/Goals: 14-18 days MinA PT and OT ?            - advised pt to not refuse therapies-  ?-Continue CIR therapies including PT, OT  ?2.  Antithrombotics: ?-DVT/anticoagulation:  Mechanical: Sequential compression devices, below knee Bilateral lower extremities ?4/21- Dopplers (-) for DVTs ? 4/22- pt refusing to use SCDs- educated needs them to reduce risk of DVTs ?            -antiplatelet therapy: N/A due to GIB ?3. Left hip/shoulder pain/myalgias/ Pain Management: oxycodone prn for diffuse pain. ?4/20- will schedule Tramadol 100 mg q8 hours not q6 hours due to renal issues- Cr 1.31-  con't oxycodone for pain prn- was taking NSAIDs at home and caused GI bleed.  ?4/24- con't tramadol scheduled and oxy prn y ?4. Mood: LCSW to follow for evaluation and support.  ?            -antipsychotic agents: N/a ?5. Neuropsych: This patient is capable of making decisions on her own behalf. ?6. Skin/Wound Care: Pressure relief measures.  ?--Monitor for recurrent peripheral edema with increase in activity.  ?--modify FR as indicated. ?4/20- on low air loss mattress- con't that and 45M tegaderm for ankles B/L per WOC  ?4/24 understands that she has to remain on air mattress ? -requests that  pressure kept high ?7. Fluids/Electrolytes/Nutrition: Monitor I/O. Check CMET in am.  ?8. Enterococcus fecalis bacteremia: On Ampicillin and ceftriaxone with end date 05/07 ?9. ABLA due to GIB:  ON PPI BID. Will add iron supplement with Miralax daily ?--check anemia panel as question Vitamin B12  and iron deficiency.   ? 4/20- has iron deficiency ? 4/21- on IV Iron as well as PO ? 4/24 hgb up to 8.2 ?  -encouraged appropriate diet, supps as above ?10. CAF: Monitor HR TID--continue metoprolol. ?4/21- rate controlled- con't regimen  ?11. Chronic diastolic CHF/fluid overload: Heart healthy diet. Strict I/O. ?-- 2000 cc (IV+ PO) FR--will change to 1000 cc po as getting IV every 4 hours.  ?            --monitor for orthostatic changes (due to prior hx) ?            --continue Lasix, metoprolol and spironolactone.  ?  ?Filed Weights  ? 04/10/22 2251 04/11/22 0417 04/12/22 0414  ?Weight: 136 kg (!) 140 kg 127 kg  ?  -weights inconsistent, likely from technique. Still quite a bit lower from admit weight--check weights daily ?12. Restless leg syndrome: Continue with Requip 2 mg bid and 3 mg hs.  ?13. OSA/OHS: Hypercarbia noted but improved from 43-->35. ?-- Encourage BIPAP use as able to tolerate it for 1- 3 hours thorough the night.  ?--check ABG for baseline/BIPAP justification if needed at discharge  ?--Wean oxygen as tolerated. Encourage pulmonary hygiene.  ? 4/20- pt having husband bring in from home ? 4/24 on 2L O2 [place] currently ?14. Morbid obesity: BMI 60.77. Educate on appropriate diet to promote mobility and health.  ? 4/20- BMI down slightly to 56.15- but likely due to bed change ? 4/22- BMI 53- due to change of bed.  ? 4/23- BMI 54.67- due to bed changes ?15. Peripheral edema: Greatly improved due to elevation and limited mobility.  ?            --educated on importance of low salt diet and elevation when seated.   ?            --continue protein supplement for likely low protein stores ?            --continue  compression stockings or ace wrap to prevent worsening of edema.  ? 4/20- Edema looks better this AM- 2+ edema- con't TEDs.  ? 16. Wounds ? 4/20- on low air loss mattress- pt said not as big as one upstairs- explained they are dif

## 2022-04-12 NOTE — Progress Notes (Signed)
RT spoke with patient about her home BiPAP that is at bedside in the box.  Patient stated that the home health provider had been contacted.  RT explained that with home equipment the settings are normally set by the carrier and can't be changed by the RT in the hospital.  Patient doesn't want to wear a hospital provided BiPAP and would prefer to hold off on wearing until she goes home.  RT explained to RN at bedside that settings were told to the CSW but without knowing if the machine is preset will not be able to place patient on the home equipment at bedside.  RN will report to day shift to see if the provider can come setup for patient in the hospital. ?

## 2022-04-12 NOTE — Progress Notes (Signed)
Inpatient Rehabilitation Care Coordinator ?Assessment and Plan ?Patient Details  ?Name: Stacy Hanson ?MRN: [id number] ?Date of Birth: [birth year] ? ?Today's Date: 04/12/2022 ? ?Hospital Problems: Principal Problem: ?  Debility ? ?Past Medical History:  ?Past Medical History:  ?Diagnosis Date  ? Anemia   ? Arthritis   ? Atrial fibrillation ([place])   ? CHF (congestive heart failure) ([place])   ? Diabetes mellitus without complication ([place])   ? Dysrhythmia   ? GIB (gastrointestinal bleeding)   ? Hematochezia   ? Hypertension   ? Lymphedema   ? Morbid obesity ([place])   ? Neuromuscular disorder ([place])   ? Sleep apnea   ? Venous insufficiency of right leg   ? ?Past Surgical History:  ?Past Surgical History:  ?Procedure Laterality Date  ? JOINT REPLACEMENT    ? TOTAL KNEE ARTHROPLASTY    ? UMBILICAL HERNIA REPAIR    ? ?Social History:  reports that she has never smoked. She has never used smokeless tobacco. She reports that she does not drink alcohol and does not use drugs. ? ?Family / Support Systems ?Marital Status: Married ?How Long?: 18 years ?Patient Roles: Spouse ?Spouse/Significant Other: [name] (husband) ?Children: no children ?Other Supports: None reported ?Anticipated Caregiver: Pt husband ?Ability/Limitations of Caregiver: Pt husband able to provide 24/7 care. Concerned about level of care wife may be ?Caregiver Availability: 24/7 ?Family Dynamics: Pt lives with her husband ? ?Social History ?Preferred language: English ?Religion:  ?Cultural Background: Pt worked in Psychologist, forensic for 26 yrs ?Education: college grad ?Health Literacy - How often do you need to have someone help you when you read instructions, pamphlets, or other written material from your doctor or pharmacy?: Never ?Writes: Yes ?Employment Status: Retired ?Date Retired/Disabled/Unemployed: 2012 ?Legal History/Current Legal Issues: Denies ?Guardian/Conservator: N/A  ? ?Abuse/Neglect ?Abuse/Neglect Assessment Can Be  Completed: Yes ?Physical Abuse: Denies ?Verbal Abuse: Denies ?Sexual Abuse: Denies ?Exploitation of patient/patient's resources: Denies ?Self-Neglect: Denies ? ?Patient response to: ?Social Isolation - How often do you feel lonely or isolated from those around you?: Never ? ?Emotional Status ?Pt's affect, behavior and adjustment status: Pt in good spirits at time of visit ?Recent Psychosocial Issues: Pt reports being cholostrophobic especially with CPAP ?Psychiatric History: Denis any. States her PCP said she had anxiety and took gabapentin for a period of time but taken off due to edema. ?Substance Abuse History: reports occassional drink ? ?Patient / Family Perceptions, Expectations & Goals ?Pt/Family understanding of illness & functional limitations: Pt and husband have a general understanding of care needs ?Premorbid pt/family roles/activities: Independent ?Anticipated changes in roles/activities/participation: Assistance with ADlS/IADLs ?Pt/family expectations/goals: Pt is goal is getting seld moving and walking again. ? ?[employer] ?Community Agencies: None ?Premorbid Home Care/DME Agencies: None ?Transportation available at discharge: Husband ?Is the patient able to respond to transportation needs?: Yes ?In the past 12 months, has lack of transportation kept you from medical appointments or from getting medications?: No ?In the past 12 months, has lack of transportation kept you from meetings, work, or from getting things needed for daily living?: No ?Resource referrals recommended: Neuropsychology ? ?Discharge Planning ?Living Arrangements: Spouse/significant other ?Support Systems: Spouse/significant other ?Type of Residence: Private residence ?Insurance Resources: [employer], Multimedia programmer (specify) [employer]- covers Rx) ?Financial Resources: Social Security ?Financial Screen Referred: No ?Living Expenses: Own ?Money Management: Patient, Spouse ?Does the patient have any problems obtaining your  medications?: No ?Home Management: Reports she manages all indoor house chores and husband manages yardwork ?Patient/Family Preliminary Plans: TBD ?  Care Coordinator Barriers to Discharge: Decreased caregiver support, Lack of/limited family support ?Care Coordinator Anticipated Follow Up Needs: HH/OP ?Expected length of stay: 21-26 ? ?Clinical Impression ?SW met with pt in room to introduce self, explain role, and discuss discharge process. Pt is not a English as a second language teacher. HCPOA her husband [name]. Pt states forms in soft chart on unit. Pt will need DME if d/c to home. Pt has BiPAP machine here Chief Executive Officer), and SW will work on getting settings from vendor.  ? ?89- SW made contact with pt husband  to introduce self, explain role, and discuss discharge process. Husband reports pt was sleeping in a recliner for a year and would like to see if she is able to do this again, as well as, get up and use her rollator. Aware SW will f/u after team conference.  ? ?Rana Snare ?04/12/2022, 3:37 PM ? ?  ?

## 2022-04-13 DIAGNOSIS — R5381 Other malaise: Secondary | ICD-10-CM | POA: Diagnosis not present

## 2022-04-13 MED ORDER — ENSURE MAX PROTEIN PO LIQD
11.0000 [oz_av] | Freq: Every day | ORAL | Status: DC
  Administered 2022-04-13 – 2022-05-06 (×22): 11 [oz_av] via ORAL

## 2022-04-13 NOTE — Patient Care Conference (Signed)
Inpatient RehabilitationTeam Conference and Plan of Care Update ?Date: 04/13/2022   Time: 11:09 AM ?  ? ? ?Patient Name: Stacy Hanson      ?Medical Record Number: [id number]  ?Date of Birth: [birth year] ?Sex: Female         ?Room/Bed: 7N17G/0F74B-44 ?Payor Info: Payor: MEDICARE / Plan: MEDICARE PART A AND B / Product Type: *No Product type* /   ? ?Admit Date/Time:  04/07/2022  2:08 PM ? ?Primary Diagnosis:  Debility ? ?Hospital Problems: Principal Problem: ?  Debility ? ? ? ?Expected Discharge Date: Expected Discharge Date: 05/06/22 ? ?Team Members Present: ?Physician leading conference: Dr. Genice Rouge ?Social Worker Present: Cecile Sheerer, LCSWA ?Nurse Present: Kennyth Arnold, RN ?PT Present: Peter Congo, PT ?OT Present: Ardis Rowan, Jaynee Eagles, OT ?PPS Coordinator present : Fae Pippin, SLP ? ?   Current Status/Progress Goal Weekly Team Focus  ?Bowel/Bladder ? ? Incontient bowel, incontinent at times of bladder LBM 4/24 Using female urinal  Become continent B/B  offer toileting assist as needed. Trial timed toileting   ?Swallow/Nutrition/ Hydration ? ?           ?ADL's ? ? bathing/dressing-max A; tranfsers with maxisky; sit<>stand with max A+2  transfers-min A; UB baitng/dressing-min A; LB dressing-mod A; toileting-max A  bed mobility, sitting balance, standing balance, BADL retraining, activity tolerance   ?Mobility ? ? max A +2 bed mobility, max A +2 stand to RW, dependent transfers via maxisky  mod A overall, short distance gait (10 ft), w/c mobility 50 ft  sit to stand, transfers, OOB tolerance, endurance, strengthening, balance   ?Communication ? ?           ?Safety/Cognition/ Behavioral Observations ?           ?Pain ? ? 4-6/10 pain to BLE managed with scheduled tramadol and PRN oxy  <3 out of 10  Assess pain q shift and treat PRN   ?Skin ? ? Old blisters to BLE- clear dressings intact, Redness to Peri area/buttocks  No skin breakdown  Assess skin q shift and PRN   ? ? ?Discharge  Planning:  ?D/c to home with pt husband. Pt husband woud like for pt to be as independent as possible.   ?Team Discussion: ?Still not happy with bed. Happy to spend time in chair. Maxi-sky used to move from bed to chair. GIB due to NSAIDS. Weight has been inconsistent due to bed. Refusing SCD's. Edema looking better. Incontinent B/B, LBM 4/23. Using female urinal. Reporting 8/10 pain. Venous stasis ulcers to BLE. Possible sore on bottom. Discharging home with husband. Will have to reorder equipment. ? ?Patient on target to meet rehab goals: ?Mod assist goals. Currently max +2 rolling, standing at walker. Endurance is low. Bed level max +2 ADL's and bed mobility. ? ?*See Care Plan and progress notes for long and short-term goals.  ? ?Revisions to Treatment Plan:  ?Adjusting medications. ?  ?Teaching Needs: ?Family education, medication/pain management, skin/wound care, transfer/gait training, etc. ?  ?Current Barriers to Discharge: ?Decreased caregiver support, Home enviroment access/layout, Incontinence, Wound care, Lack of/limited family support, Weight, and anxiety. ? ?Possible Resolutions to Barriers: ?Family education ?Follow-up therapy ?Order recommended DME ?  ? ? Medical Summary ?Current Status: claustrophobic due to bed/etc- inconitnent B/B- using female urinal with improvement- B/L venous stasis ulcers on legs- BMI 54; pain 8/10 per pt- taking tramadol scheduled and oxy prn; ? Barriers to Discharge: Behavior;Home enviroment access/layout;Incontinence;Neurogenic Bowel & Bladder;IV antibiotics;Medical stability;Weight;Wound care;Medication compliance ? Barriers to Discharge  Comments: refusing to use BiPAP- settings in SW note; home with husband; has all equipment /sent back- would need ot reorder; ?Possible Resolutions to [employer]: max A of 2 rolling; maxisky transfer- max A of 2 to stand with bariatric RW- self limiting- afraid as well as weak- poor endurance- cannot weight shift either- and  feet hurt-  goals mod A- slow progress- max A of 2 ADLs- 5/18- 4 weesk ? ? ?Continued Need for Acute Rehabilitation Level of Care: The patient requires daily medical management by a physician with specialized training in physical medicine and rehabilitation for the following reasons: ?Direction of a multidisciplinary physical rehabilitation program to maximize functional independence : Yes ?Medical management of patient stability for increased activity during participation in an intensive rehabilitation regime.: Yes ?Analysis of laboratory values and/or radiology reports with any subsequent need for medication adjustment and/or medical intervention. : Yes ? ? ?I attest that I was present, lead the team conference, and concur with the assessment and plan of the team. ? ? ?Kennyth Arnold G ?04/13/2022, 3:12 PM  ? ? ? ? ? ? ?

## 2022-04-13 NOTE — Progress Notes (Signed)
?                                                       PROGRESS NOTE ? ? ?Subjective/Complaints: ? ? ?Bed still an issue- PT asking about Kreg bed? Wil check into it.  ?"Down in a hole"- doesn't look like it, but pt says she feels like wrapped in taco.  ?Sat in chair 2-3 hours yesterday and feels like overdid it.  ?Using hoyer lift on ceiling to get into chair ?LBM incontinent/yesterday.  ? ?Did therapy exercises in bed to finish therapy yesterday.  ? ?ROS:  ?Pt denies SOB, abd pain, CP, N/V/C/D, and vision changes ? ?Objective: ?  ?No results found. ? ?Recent Labs  ?  04/12/22 ?[id number]  ?WBC 7.4  ?HGB 8.2*  ?HCT 27.8*  ?PLT 256  ? ? ?Recent Labs  ?  04/12/22 ?[id number]  ?NA 139  ?K 3.8  ?CL 98  ?CO2 36*  ?GLUCOSE 90  ?BUN 14  ?CREATININE 0.96  ?CALCIUM 8.9  ? ? ? ?Intake/Output Summary (Last 24 hours) at 04/13/2022 0958 ?Last data filed at 04/13/2022 0132 ?Gross per 24 hour  ?Intake 590.33 ml  ?Output 100 ml  ?Net 490.33 ml  ?  ? ?  ? ?Physical Exam: ?Vital Signs ?Blood pressure (!) 150/69, pulse 80, temperature 98.2 ?F (36.8 ?C), resp. rate 20, height 5\' 3"  (1.6 m), weight (!) 138.8 kg, SpO2 90 %. ? ? ? ? ? ? ? ?General: awake, alert, appropriate, BMI 54.21; sitting up in low loss air bed/bariatric; NAD ?HENT: conjugate gaze; oropharynx moist- wearing O2 by [place] ?CV: regular rate; no JVD ?Pulmonary: CTA B/L; no W/R/R- good air movement ?GI: soft, NT, ND, (+)BS ?Psychiatric: appropriate- irritated/grumpy ?Neurological: Ox3 ?Musculoskeletal:  ?   Comments: Limited ROM bilateral shoulder with stiffness and crepitus left elbow with attempts at ROM. No pain with PROM left shoulder.  1+ tibially and 2+ pedally.  .  Diffuse weakness.  ?Skin: ?   General: Skin is warm and dry.  ?Wounds with tegaderm on B/L ankles for chronic areas, both areas appear clean.  ? ? ?Assessment/Plan: ?1. Functional deficits which require 3+ hours per day of interdisciplinary therapy in a comprehensive inpatient rehab setting. ?Physiatrist is providing  close team supervision and 24 hour management of active medical problems listed below. ?Physiatrist and rehab team continue to assess barriers to discharge/monitor patient progress toward functional and medical goals ? ?Care Tool: ? ?Bathing ?   ?Body parts bathed by patient: Left upper leg, Right upper leg, Face  ? Body parts bathed by helper: Right arm, Left arm, Chest, Abdomen, Front perineal area, Buttocks, Right lower leg, Left lower leg ?  ?  ?Bathing assist Assist Level: Total Assistance - Patient < 25% ?  ?  ?Upper Body Dressing/Undressing ?Upper body dressing   ?What is the patient wearing?: Pull over shirt ?   ?Upper body assist Assist Level: Total Assistance - Patient < 25% ?   ?Lower Body Dressing/Undressing ?Lower body dressing ? ? ?   ?What is the patient wearing?: Incontinence brief ? ?  ? ?Lower body assist Assist for lower body dressing: 2 Helpers ?   ? ?Toileting ?Toileting Toileting Activity did not occur Landscape architect and hygiene only):  (incontient)  ?Toileting assist Assist for toileting: 2 Helpers ?  ?  ?  Transfers ?Chair/bed transfer ? ?Transfers assist ? Chair/bed transfer activity did not occur: Refused ? ?Chair/bed transfer assist level: Dependent - mechanical lift ?  ?  ?Locomotion ?Ambulation ? ? ?Ambulation assist ? ? Ambulation activity did not occur: Safety/medical concerns ? ?  ?  ?   ? ?Walk 10 feet activity ? ? ?Assist ? Walk 10 feet activity did not occur: Safety/medical concerns ? ?  ?   ? ?Walk 50 feet activity ? ? ?Assist Walk 50 feet with 2 turns activity did not occur: Safety/medical concerns ? ?  ?   ? ? ?Walk 150 feet activity ? ? ?Assist Walk 150 feet activity did not occur: Safety/medical concerns ? ?  ?  ?  ? ?Walk 10 feet on uneven surface  ?activity ? ? ?Assist Walk 10 feet on uneven surfaces activity did not occur: Safety/medical concerns ? ? ?  ?   ? ?Wheelchair ? ? ? ? ?Assist Is the patient using a wheelchair?: Yes ?Type of Wheelchair: Manual (Per  PT) ?Wheelchair activity did not occur: Refused ? ?  ?   ? ? ?Wheelchair 50 feet with 2 turns activity ? ? ? ?Assist ? ?  ?Wheelchair 50 feet with 2 turns activity did not occur: Refused ? ? ?   ? ?Wheelchair 150 feet activity  ? ? ? ?Assist ? Wheelchair 150 feet activity did not occur: Refused ? ? ?   ? ?Blood pressure (!) 150/69, pulse 80, temperature 98.2 ?F (36.8 ?C), resp. rate 20, height 5\' 3"  (1.6 m), weight (!) 138.8 kg, SpO2 90 %. ? ?Medical Problem List and Plan: ?1. Functional deficits secondary to debility- for complicated medical stay-CHF exacerbation/bacteremia ?            -patient may shower ?            -ELOS/Goals: 14-18 days [facility] PT and OT ?          Continue CIR- PT, OT  ? Team conference today to determine length of stay- slow progress-  ?2.  Antithrombotics: ?-DVT/anticoagulation:  Mechanical: Sequential compression devices, below knee Bilateral lower extremities ?4/21- Dopplers (-) for DVTs ? 4/22- pt refusing to use SCDs- educated needs them to reduce risk of DVTs ?            -antiplatelet therapy: N/A due to GIB ?3. Left hip/shoulder pain/myalgias/ Pain Management: oxycodone prn for diffuse pain. ?4/20- will schedule Tramadol 100 mg q8 hours not q6 hours due to renal issues- Cr 1.31-  con't oxycodone for pain prn- was taking NSAIDs at home and caused GI bleed.  ?4/24- con't tramadol scheduled and oxy prn -  ?4/25- will not go home on Oxy- just tramadol ?4. Mood: LCSW to follow for evaluation and support.  ?            -antipsychotic agents: N/a ?5. Neuropsych: This patient is capable of making decisions on her own behalf. ?6. Skin/Wound Care: Pressure relief measures.  ?--Monitor for recurrent peripheral edema with increase in activity.  ?--modify FR as indicated. ?4/20- on low air loss mattress- con't that and 69M tegaderm for ankles B/L per WOC  ?4/24 understands that she has to remain on air mattress ? -requests that pressure kept high ?7. Fluids/Electrolytes/Nutrition: Monitor I/O. Check  CMET in am.  ?8. Enterococcus fecalis bacteremia: On Ampicillin and ceftriaxone with end date 05/07 ?9. ABLA due to GIB:  ON PPI BID. Will add iron supplement with Miralax daily ?--check anemia panel as question Vitamin  B12 and iron deficiency.   ? 4/20- has iron deficiency ? 4/21- on IV Iron as well as PO ? 4/24 hgb up to 8.2 ?  -encouraged appropriate diet, supps as above ?10. CAF: Monitor HR TID--continue metoprolol. ?4/21- rate controlled- con't regimen  ?11. Chronic diastolic CHF/fluid overload: Heart healthy diet. Strict I/O. ?-- 2000 cc (IV+ PO) FR--will change to 1000 cc po as getting IV every 4 hours.  ?            --monitor for orthostatic changes (due to prior hx) ?            --continue Lasix, metoprolol and spironolactone.  ?  ?[place] Weights  ? 04/11/22 0417 04/12/22 0414 04/13/22 0436  ?Weight: (!) 140 kg 127 kg (!) 138.8 kg  ?  -weights inconsistent, likely from technique. Still quite a bit lower from admit weight--check weights daily ?12. Restless leg syndrome: Continue with Requip 2 mg bid and 3 mg hs.  ?13. OSA/OHS: Hypercarbia noted but improved from 43-->35. ?-- Encourage BIPAP use as able to tolerate it for 1- 3 hours thorough the night.  ?--check ABG for baseline/BIPAP justification if needed at discharge  ?--Wean oxygen as tolerated. Encourage pulmonary hygiene.  ? 4/20- pt having husband bring in from home ? 4/24 on 2L O2 [place] currently ?14. Morbid obesity: BMI 60.77. Educate on appropriate diet to promote mobility and health.  ? 4/20- BMI down slightly to 56.15- but likely due to bed change ? 4/22- BMI 53- due to change of bed.  ? 4/25- BMI 54/21-  ?15. Peripheral edema: Greatly improved due to elevation and limited mobility.  ?            --educated on importance of low salt diet and elevation when seated.   ?            --continue protein supplement for likely low protein stores ?            --continue compression stockings or ace wrap to prevent worsening of edema.  ? 4/20- Edema looks  better this AM- 2+ edema- con't TEDs.  ? 16. Wounds ? 4/20- on low air loss mattress- pt said not as big as one upstairs- explained they are different hospital and already on bariatric low air loss mattress that we
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
## 2022-04-13 NOTE — Consult Note (Signed)
WOC Nurse Consult Note: ?Reason for Consult: ?Discuss low air loss mattress with patient. She reports that she feels like she is "in a hole".  I have explained the rationale for this, that very different than sleeping on top of a mattress this mattress is designed to envelope the patient, allowing the patient to sleep "in" the mattress.  I have discussed the "chambers" that she is not a fan of and how they work to alternate the pressure under her heels, sacrum, ischium and other bony prominences. We discussed the need to still be turned from side to side. She seems to understand all of this. Stating "I know all that".  We discussed a plan to improve her mealtimes, she reports "feeling like I am going to choke". She reports "difficulty with using the bedpad" "being on my head".   ? ?Auto firm bed at meal times, making sure the over the bed table is high enough to not constrict the patient when the bed is fully inflated ?Auto firm bed when turning patient to her side ?Auto firm bed when using bed pan ?WOC to explore options for pressure redistribution in a chair, feel that getting the patient OOB for meals would be much better.  ?Unfortunately bariatric pressure redistribution chair pads are not readily available for use. Will request rehab to evaluate for any options for her chair to allow sitting for meals.  However noted patient has refused to get up for meals and/or get up into the chair due to discomfort with chairs.  ? ?Discussed with rehab MD, bedside RN.  I will add PT/OT to secure chat for discussion.  ? ?Stacy Inthavong MSN,RN,CWOCN, CNS, CWON-AP ?[phone]  ? ? ?  ?

## 2022-04-13 NOTE — Progress Notes (Signed)
Pt still have new machine in at bedside in the box. Pt stated social worker told her that the home health provider had pre-set the setting in home unit. RT unable to verify settings on home unit, therefore offered pt hospital unit. pt doesn't want a hospital provided CPAP unit. RT will continue to monitor.  ?

## 2022-04-13 NOTE — Progress Notes (Signed)
Pt frequently asking nursing to max inflate her mattress r/t discomfort. Education provided on benefits of air mattress and pressure redistribution; reinforced injurious nature of shear and pressure. Pt endorses understanding of the therapy and agrees that the bed is functioning properly, but voices concerns r/t aspiration at some points in the bed's cycle ("I can't swallow right when it does this").  ?I agreed with pt to use max inflate while she is eating, and asked the pt to allow the pressure redistribution to cycle as intended otherwise for maximum benefit.  ?Pt is amenable to therapies recommended and does not want to refuse any therapy. WOC RN consulted to investigate any possible strategies pt may use to achieve comfort during therapies. ?

## 2022-04-13 NOTE — Progress Notes (Signed)
Patient ID: Maraya Gwilliam, female   DOB: 01/15/1951, 71 y.o.   MRN: [id number] ? ?SW met with pt in room to provide updates from team conference, and d/c date 5/18. Pt aware SW to follow-up with pt husband.  ? ?62- SW called pt husband [name] to inform on above. Reports he is here in room.  ? ?*SW met with pt and pt husband to review above. Aware SW will follow-up with updates after team conference on Tuesday.  ? ?[name], MSW, LCSWA ?Office: [phone] ?Cell: [phone] ?Fax: [phone]  ?

## 2022-04-13 NOTE — Progress Notes (Addendum)
Physical Therapy Session Note ? ?Patient Details  ?Name: Stacy Hanson ?MRN: [id number] ?Date of Birth: 11/17/1951 ? ?Today's Date: 04/13/2022 ?PT Individual Time: 0800-0900; 1300-1410 ?PT Individual Time Calculation (min): 60 min and 70 min ? ?Short Term Goals: ?Week 1:  PT Short Term Goal 1 (Week 1): Pt will perform bed mobility wiht max assist of 1. ?PT Short Term Goal 2 (Week 1): Pt will tolerate sitting in recliner for >2 hours throughout the day. ?PT Short Term Goal 3 (Week 1): Pt will transfer out of bed with max assist for safety. ? ?Skilled Therapeutic Interventions/Progress Updates:  ?  Session 1: ?Pt received seated in bed, agreeable to PT session. Pt reports some pain in her R hip and RLE, not rated. Pt unsure if she has been premedicated for pain. Utilized repositioning during session for pain management. Pt able to continently void urine at bed level with use of female urinal. Pt able to have a BM at bed level with assist from nursing for disimpaction. Pt is max A x 2 for rolling L/R in bed for dependent pericare, brief change, and placement of maxisky sling. Maxisky transfer bed to bariatric recliner. Pt on 2L O2 via [place] throughout session, SpO2 remains at 93% and higher. Pt agreeable to remain seated in bari recliner at end of session, needs in reach. ? ?Session 2: ?Pt received seated in bed, agreeable to PT session. Pt reports intermittent pain in her abdomen depending on positioning, utilized repositioning and rest breaks as needed for pain management. Pt requests to use urinal. Pt is dependent for clothing management and placement of female urinal. Pt able to continently void, however brief found to be soiled with urine as well prior to this void. Pt is dependent for pericare and donning of clean brief. Pt is able to roll with max A x 2 and use of bedrails. Supine to sit with max A x 2. Sit to stand with max A x 2 with knees blocked by bariatric recliner. Pt able to stand x 2 reps and able to  remain standing x 1 min before onset of fatigue. Pt does exhibit flexed trunk during stance and unable to achieve full upright trunk extension. Pt is able to take a few steps back with each LE towards the bed prior to sitting, first time she has been able to lift up LE in standing! Pt requests to lay back down due to fatigue. Sit to supine assist x 3 for trunk control and BLE management due to instability of air mattress surface and pt inability to scoot hips backwards onto bed. Pt requires assist x 2 to scoot to [facility]. Pt left seated in bed with needs in reach, husband Kathlene November present. Pt's husband is hands-on and assists with mobility this session. ? ?Therapy Documentation ?Precautions:  ?Precautions ?Precautions: Fall ?Restrictions ?Weight Bearing Restrictions: No ? ? ? ? ?Therapy/Group: Individual Therapy ? ? ?Peter Congo, PT, DPT, CSRS ?04/13/2022, 12:28 PM  ?

## 2022-04-14 NOTE — Progress Notes (Signed)
Patient ID: Stacy Hanson, female   DOB: [birth year], 71 y.o.   MRN: [id number] ? ?SW spoke with [facility] RT staff to discuss continued reports in note waiting on settings, and informed in SW note from Monday are BiPAP settings. Reports they are not able to set her BiPAP machine, only able to use settings fot [facility] machine. SW shared pt was reminded that she can use her mask since she does not like the mask offered by hospital. SW informed will include in today's note to see if vendor can come in and adjust settings on BiPAP machine.  ? ?SW spoke with [facility] to discuss if their RT can come in and adjust her BiPAP settings. Reports he will have RT come in to adjust.  ? ?Cecile Sheerer, MSW, LCSWA ?Office: [phone] ?Cell: [phone] ?Fax: [phone]  ?

## 2022-04-14 NOTE — Progress Notes (Signed)
Occupational Therapy Session Note ? ?Patient Details  ?Name: Stacy Hanson ?MRN: [id number] ?Date of Birth: 04/01/1951 ? ?Today's Date: 04/14/2022 ?OT Individual Time: 6301-6010 ?OT Individual Time Calculation (min): 75 min  ? ? ?Short Term Goals: ?Week 1:  OT Short Term Goal 1 (Week 1): Pt will be able to dress UB with mod A at the EOB ?OT Short Term Goal 2 (Week 1): Pt will toleate sitting out of the bed for 2 hrs at a time ?OT Short Term Goal 3 (Week 1): Pt will be able to perform 2 grooming tasks with mod A ?OT Short Term Goal 4 (Week 1): Pt will transition to EOB in hospital bed with max A +1 ? ?Skilled Therapeutic Interventions/Progress Updates:  ?  Treatment session with focus on BUE AAROM and PROM and functional transfers.  Pt received semi-reclined in bed reporting to therapist what she had accomplished during previous therapy sessions.  RN asked that therapy assist with transfer to new hospital bed during session.  Engaged in [place] with focus on bicep strengthening bilaterally.  Pt with ability to push RUE against mod resistance and LUE against min resistance 50% of the time.  Engaged in PROM to L shoulder with focus on tolerance of ROM, with no reports of pain during mobility.  Engaged in bed mobility to come to sitting at EOB with max assist and +2 for emotional support and to manage BLE while therapist managed trunk support.  Pt with improved ability to advance RLE towards EOB.  Pt completed sit > stand from EOB with bari RW with min assist +2.  Engaged in side stepping with +2 with mod multimodal cues and encouragement and 3rd person present for moral support during transfer and safety due to transitioning from one high bed to another high bed.  Pt completed sit > stand again min +2 and completed side stepping towards HOB prior to returning to supine. Pt required +2 for sit > supine with assist to lift BLE and control trunk into supine.  Pt left semi-reclined in bed with all needs in reach. ? ?Therapy  Documentation ?Precautions:  ?Precautions ?Precautions: Fall ?Restrictions ?Weight Bearing Restrictions: No ?General: ?  ?Vital Signs: ?Therapy Vitals ?Temp: 98.2 ?F (36.8 ?C) ?Temp Source: Oral ?Pulse Rate: 80 ?Resp: 14 ?BP: (!) 105/51 ?Patient Position (if appropriate): Lying ?Oxygen Therapy ?SpO2: 97 % ?O2 Device: Room Air ?Pain: ? Pt with no c/o pain ? ? ? ?Therapy/Group: Individual Therapy ? ?Rosalio Loud ?04/14/2022, 3:31 PM ?

## 2022-04-14 NOTE — Progress Notes (Signed)
Physical Therapy Session Note ? ?Patient Details  ?Name: Stacy Hanson ?MRN: [id number] ?Date of Birth: Apr 30, 1951 ? ?Today's Date: 04/14/2022 ?PT Individual Time: 2119-4174 ?PT Individual Time Calculation (min): 55 min  ? ?Short Term Goals: ?Week 1:  PT Short Term Goal 1 (Week 1): Pt will perform bed mobility wiht max assist of 1. ?PT Short Term Goal 2 (Week 1): Pt will tolerate sitting in recliner for >2 hours throughout the day. ?PT Short Term Goal 3 (Week 1): Pt will transfer out of bed with max assist for safety. ? ?Skilled Therapeutic Interventions/Progress Updates: Pt presented in recliner requesting to use bedpan. Pt indicating R knee "sore" from therex this am. Pt agreeable to attempt to stand to place bedpan under pt in bari-recliner. Due to pt's short stature pt's legs unable to reach floor with chair placed in lowest position. Pt also was unable to place full foot on foot plate to attempt forward shifting therefore plan changed to sit EOB and use female urinal. Pt therefore transferred via Maxi -SKy to EOB with +2 assistance. Once bed was set up for max inflate pt was able to lean backwards to allow PTA to place urinal for continent void. Pt then performed Sit to stand from EOB with heavy duty RW and although +2 present both therapist and tech only providing minA as most. Pt was able to lift and readjust BLE independently as well as pt was able to step backwards so that BLE were against bed. Pt was able to sit EOB and after extended seated rest stand again just for readjustments of chuck pads and better positioning when sitting. Pt then required maxA x 2 for sit to supine for both truncal support and BLE management. Pt required dependent x 2 with use of pad to scoot to [place]. Pt was repositioned to comfort and left with call bell within reach and needs met.  ?   ? ?Therapy Documentation ?Precautions:  ?Precautions ?Precautions: Fall ?Restrictions ?Weight Bearing Restrictions: No ?General: ?  ?Vital  Signs: ? ?Pain: ?Pain Assessment ?Pain Score: 5  ? ?Therapy/Group: Individual Therapy ? ?Uday Jantz ?04/14/2022, 12:54 PM  ?

## 2022-04-14 NOTE — Progress Notes (Signed)
Physical Therapy Session Note ? ?Patient Details  ?Name: Stacy Hanson ?MRN: [id number] ?Date of Birth: 05-Nov-1951 ? ?Today's Date: 04/14/2022 ?PT Individual Time: 0900-1000 ?PT Individual Time Calculation (min): 60 min  ? ?Short Term Goals: ?Week 1:  PT Short Term Goal 1 (Week 1): Pt will perform bed mobility wiht max assist of 1. ?PT Short Term Goal 2 (Week 1): Pt will tolerate sitting in recliner for >2 hours throughout the day. ?PT Short Term Goal 3 (Week 1): Pt will transfer out of bed with max assist for safety. ? ?Skilled Therapeutic Interventions/Progress Updates:  ?  Pt received seated in bed, agreeable to PT session but reports bowel and urinary incontinence and has notified nursing she needs assist with pericare. This therapist assists with pericare due to nursing not being available to assist. Rolling L/R with max A x 2 and use of bedrails for dependent pericare and brief change. Pt able to further void BM once in sidelying. Discussed possible use of toileting sling to work on transfers to [facility] for more functional toileting during future therapy sessions. Placed maxi sky sling under patient. Maxi sky transfer bed to bariatric recliner/"shuttle" chair. Obtained Roho cushions for improved skin protection while seated up in chair. Pt reports improved comfort with use of Roho cushions. Seated BLE strengthening therex: LAQ, HS curls, hip add squeeze 2 x 10 reps. Reviewed shuttle chair functions including tilt and recline for pressure relief. Pt demonstrates good understanding of how to utilize chair controls for pressure relief and for safety. SpO2 drops to 85% with activity during session while on 2L O2 via [place], returns to 90% (+) with seated rest break and pursed lip breathing. Pt left seated in shuttle chair in room with needs in reach at end of session. ? ?Therapy Documentation ?Precautions:  ?Precautions ?Precautions: Fall ?Restrictions ?Weight Bearing Restrictions: No ? ? ? ? ? ? ?Therapy/Group:  Individual Therapy ? ? ?Peter Congo, PT, DPT, CSRS ?04/14/2022, 11:10 AM  ?

## 2022-04-14 NOTE — Progress Notes (Signed)
Pt requesting for bed to be placed in auto firm as she will be eating breakfast soon. NT had already pushed auto firm and pt is insisting the bed is not working though monitor on bed says bed is as firm as it can go. Pt requesting this nurse to "read instructions on door". Instructions on door only consist of when the pt should be in firm position. Pt then pulls same instructions out of a book she has. Instructed pt that the instructions she is holding does not say how to make bed "more firm".  ?

## 2022-04-15 DIAGNOSIS — R5381 Other malaise: Secondary | ICD-10-CM | POA: Diagnosis not present

## 2022-04-15 LAB — CBC
HCT: 29.7 % — ABNORMAL LOW (ref 36.0–46.0)
Hemoglobin: 9 g/dL — ABNORMAL LOW (ref 12.0–15.0)
MCH: 26.8 pg (ref 26.0–34.0)
MCHC: 30.3 g/dL (ref 30.0–36.0)
MCV: 88.4 fL (ref 80.0–100.0)
Platelets: 239 10*3/uL (ref 150–400)
RBC: 3.36 MIL/uL — ABNORMAL LOW (ref 3.87–5.11)
RDW: 17.2 % — ABNORMAL HIGH (ref 11.5–15.5)
WBC: 9 10*3/uL (ref 4.0–10.5)
nRBC: 0 % (ref 0.0–0.2)

## 2022-04-15 NOTE — Progress Notes (Signed)
?                                                       PROGRESS NOTE ? ? ?Subjective/Complaints: ? ? ?Bed still an issue- PT asking about Kreg bed? Wil check into it.  ?Likes Kreg bed a lot- also able to transfer off bed better with therapy- the new bed.  ?Room cold- like she likes it.  ?Also given ROHO for chair- has helped coccyx pain immeasurably- but hasn't set up in chair longer so far- ~ 60-90 minutes yesterday.  ? ?LBM yesterday- incontinent. -  ? ?ROS:  ? ?Pt denies SOB, abd pain, CP, N/V/C/D, and vision changes ? ?Objective: ?  ?No results found. ? ?No results for input(s): WBC, HGB, HCT, PLT in the last 72 hours. ? ? ?No results for input(s): NA, K, CL, CO2, GLUCOSE, BUN, CREATININE, CALCIUM in the last 72 hours. ? ? ? ?Intake/Output Summary (Last 24 hours) at 04/15/2022 1042 ?Last data filed at 04/15/2022 0700 ?Gross per 24 hour  ?Intake 660 ml  ?Output 600 ml  ?Net 60 ml  ?  ? ?  ? ?Physical Exam: ?Vital Signs ?Blood pressure (!) 156/75, pulse 80, temperature 97.6 ?F (36.4 ?C), resp. rate 18, height 5\' 3"  (1.6 m), weight 67.3 kg, SpO2 100 %. ? ? ? ? ? ? ? ? ?General: awake, alert, appropriate, sitting up in new Kreg bed; NAD ?HENT: conjugate gaze; oropharynx moist- O2 via [place] ?CV: regular rate; no JVD ?Pulmonary: CTA B/L; no W/R/R- good air movement ?GI: soft, NT, ND, (+)BS- BMI wrong in computer- usually 54 ?Psychiatric: appropriate ?Neurological: Ox3 ? ?Musculoskeletal:  ?   Comments: Limited ROM bilateral shoulder with stiffness and crepitus left elbow with attempts at ROM. No pain with PROM left shoulder.  1+ tibially and 2+ pedally.  .  Diffuse weakness.  ?Skin: ?   General: Skin is warm and dry.  ?Wounds with tegaderm on B/L ankles for chronic areas, both areas appear clean.  ? ? ?Assessment/Plan: ?1. Functional deficits which require 3+ hours per day of interdisciplinary therapy in a comprehensive inpatient rehab setting. ?Physiatrist is providing close team supervision and 24 hour management of  active medical problems listed below. ?Physiatrist and rehab team continue to assess barriers to discharge/monitor patient progress toward functional and medical goals ? ?Care Tool: ? ?Bathing ?   ?Body parts bathed by patient: Left upper leg, Right upper leg, Face  ? Body parts bathed by helper: Right arm, Left arm, Chest, Abdomen, Front perineal area, Buttocks, Right lower leg, Left lower leg ?  ?  ?Bathing assist Assist Level: Total Assistance - Patient < 25% ?  ?  ?Upper Body Dressing/Undressing ?Upper body dressing   ?What is the patient wearing?: Pull over shirt ?   ?Upper body assist Assist Level: Total Assistance - Patient < 25% ?   ?Lower Body Dressing/Undressing ?Lower body dressing ? ? ?   ?What is the patient wearing?: Incontinence brief ? ?  ? ?Lower body assist Assist for lower body dressing: 2 Helpers ?   ? ?Toileting ?Toileting Toileting Activity did not occur Landscape architect and hygiene only):  (incontient)  ?Toileting assist Assist for toileting: 2 Helpers ?  ?  ?Transfers ?Chair/bed transfer ? ?Transfers assist ? Chair/bed transfer activity did not occur: Refused ? ?Chair/bed  transfer assist level: 2 Helpers ?  ?  ?Locomotion ?Ambulation ? ? ?Ambulation assist ? ? Ambulation activity did not occur: Safety/medical concerns ? ?  ?  ?   ? ?Walk 10 feet activity ? ? ?Assist ? Walk 10 feet activity did not occur: Safety/medical concerns ? ?  ?   ? ?Walk 50 feet activity ? ? ?Assist Walk 50 feet with 2 turns activity did not occur: Safety/medical concerns ? ?  ?   ? ? ?Walk 150 feet activity ? ? ?Assist Walk 150 feet activity did not occur: Safety/medical concerns ? ?  ?  ?  ? ?Walk 10 feet on uneven surface  ?activity ? ? ?Assist Walk 10 feet on uneven surfaces activity did not occur: Safety/medical concerns ? ? ?  ?   ? ?Wheelchair ? ? ? ? ?Assist Is the patient using a wheelchair?: Yes ?Type of Wheelchair: Manual (Per PT) ?Wheelchair activity did not occur: Refused ? ?  ?   ? ? ?Wheelchair 50  feet with 2 turns activity ? ? ? ?Assist ? ?  ?Wheelchair 50 feet with 2 turns activity did not occur: Refused ? ? ?   ? ?Wheelchair 150 feet activity  ? ? ? ?Assist ? Wheelchair 150 feet activity did not occur: Refused ? ? ?   ? ?Blood pressure (!) 156/75, pulse 80, temperature 97.6 ?F (36.4 ?C), resp. rate 18, height 5\' 3"  (1.6 m), weight 67.3 kg, SpO2 100 %. ? ?Medical Problem List and Plan: ?1. Functional deficits secondary to debility- for complicated medical stay-CHF exacerbation/bacteremia ?            -patient may shower ?            -ELOS/Goals: 14-18 days MinA PT and OT ?          set for 4 weeks-  ?cont' CIR- PT and OT ?2.  Antithrombotics: ?-DVT/anticoagulation:  Mechanical: Sequential compression devices, below knee Bilateral lower extremities ?4/21- Dopplers (-) for DVTs ? 4/22- pt refusing to use SCDs- educated needs them to reduce risk of DVTs ?            -antiplatelet therapy: N/A due to GIB ?3. Left hip/shoulder pain/myalgias/ Pain Management: oxycodone prn for diffuse pain. ?4/20- will schedule Tramadol 100 mg q8 hours not q6 hours due to renal issues- Cr 1.31-  con't oxycodone for pain prn- was taking NSAIDs at home and caused GI bleed.  ?4/24- con't tramadol scheduled and oxy prn -  ?4/25- will not go home on Oxy- just tramadol ?4. Mood: LCSW to follow for evaluation and support.  ?            -antipsychotic agents: N/a ?5. Neuropsych: This patient is capable of making decisions on her own behalf. ?6. Skin/Wound Care: Pressure relief measures.  ?--Monitor for recurrent peripheral edema with increase in activity.  ?--modify FR as indicated. ?4/20- on low air loss mattress- con't that and 75M tegaderm for ankles B/L per WOC  ?4/24 understands that she has to remain on air mattress ? -requests that pressure kept high ? 4/27- switched to Kreg bed.  ?7. Fluids/Electrolytes/Nutrition: Monitor I/O. Check CMET in am.  ?8. Enterococcus fecalis bacteremia: On Ampicillin and ceftriaxone with end date  05/07 ?9. ABLA due to GIB:  ON PPI BID. Will add iron supplement with Miralax daily ?--check anemia panel as question Vitamin B12 and iron deficiency.   ? 4/20- has iron deficiency ? 4/21- on IV Iron as well as  PO ? 4/24 hgb up to 8.2 ?  -encouraged appropriate diet, supps as above ?10. CAF: Monitor HR TID--continue metoprolol. ?4/21- rate controlled- con't regimen  ?11. Chronic diastolic CHF/fluid overload: Heart healthy diet. Strict I/O. ?-- 2000 cc (IV+ PO) FR--will change to 1000 cc po as getting IV every 4 hours.  ?            --monitor for orthostatic changes (due to prior hx) ?            --continue Lasix, metoprolol and spironolactone.  ? 4/27- weight very off- 67 kg is incorrect- will ask nursing to fix.  ?Filed Weights  ? 04/12/22 0414 04/13/22 0436 04/15/22 0500  ?Weight: 127 kg (!) 138.8 kg 67.3 kg  ?  -weights inconsistent, likely from technique. Still quite a bit lower from admit weight--check weights daily ?12. Restless leg syndrome: Continue with Requip 2 mg bid and 3 mg hs.  ?13. OSA/OHS: Hypercarbia noted but improved from 43-->35. ?-- Encourage BIPAP use as able to tolerate it for 1- 3 hours thorough the night.  ?--check ABG for baseline/BIPAP justification if needed at discharge  ?--Wean oxygen as tolerated. Encourage pulmonary hygiene.  ? 4/20- pt having husband bring in from home ? 4/24 on 2L O2 [place] currently ? 4/27- Home settings in SW notes- will let RT know ?14. Morbid obesity: BMI 60.77. Educate on appropriate diet to promote mobility and health.  ? 4/20- BMI down slightly to 56.15- but likely due to bed change ? 4/22- BMI 53- due to change of bed.  ? 4/25- BMI 54/.21 ? 4/27- BMI in computer as 26- is documented incorrectly- will ask nursing to fix ?15. Peripheral edema: Greatly improved due to elevation and limited mobility.  ?            --educated on importance of low salt diet and elevation when seated.   ?            --continue protein supplement for likely low protein stores ?             --continue compression stockings or ace wrap to prevent worsening of edema.  ? 4/20- Edema looks better this AM- 2+ edema- con't TEDs.  ? 16. Wounds ? 4/20- on low air loss mattress- pt said not as big as o

## 2022-04-15 NOTE — Progress Notes (Signed)
Occupational Therapy Session Note ? ?Patient Details  ?Name: Stacy Hanson ?MRN: [id number] ?Date of Birth: 1951/01/27 ? ?Today's Date: 04/15/2022 ?OT Individual Time: 9767-3419 ?OT Individual Time Calculation (min): 42 min  ? ? ?Short Term Goals: ?Week 1:  OT Short Term Goal 1 (Week 1): Pt will be able to dress UB with mod A at the EOB ?OT Short Term Goal 2 (Week 1): Pt will toleate sitting out of the bed for 2 hrs at a time ?OT Short Term Goal 3 (Week 1): Pt will be able to perform 2 grooming tasks with mod A ?OT Short Term Goal 4 (Week 1): Pt will transition to EOB in hospital bed with max A +1 ? ?Skilled Therapeutic Interventions/Progress Updates:  ?Pt greeted seated in bari recliner reporting having incontinent urine episode needing to return to bed from pericare. Utilized maxi sky d/t urgency, total A lift back to bed. Pt needed MOD A +2 to roll R<>L to change brief and total A for all pericare tasks, applied Desitin cream to perineal area and sacral area per pt request, RN aware. Pt needed MAX  A +2 to roll to don new brief. Worked on pulling up on bed rails to elevate trunk off back of bed however pt required MOD A +2 to keep trunk forward while rehab tech assisted with doffing gown. Encouraged pt to work on pulling trunk forward with bed rails to facilitate independence with ADL tasks from bed level. Pt left in semi reclined position in bed with all needs within reach.  ?Pt on 2L [place] during session with SpO2 >94% ? ?Therapy Documentation ?Precautions:  ?Precautions ?Precautions: Fall ?Restrictions ?Weight Bearing Restrictions: No ? ?Pain:unrated pain reported in sacrum area with cream applied for pain mgmt.  ? ? ? ?Therapy/Group: Individual Therapy ? ?Barron Schmid ?04/15/2022, 3:59 PM ?

## 2022-04-15 NOTE — Progress Notes (Signed)
Physical Therapy Weekly Progress Note ? ?Patient Details  ?Name: Stacy Hanson ?MRN: [id number] ?Date of Birth: 1951/12/14 ? ?Beginning of progress report period: April 08, 2022 ?End of progress report period: April 15, 2022 ? ?Today's Date: 04/15/2022 ?PT Individual Time: 1045-1200 ?PT Individual Time Calculation (min): 75 min  ? ?Patient has met 2 of 3 short term goals.  Pt is making slow but steady progress towards therapy goals. She is currently mod to max A overall for bed mobility with use of hospital bed features, varies with assist needed for sit to stand from min A x 1 to assist x 2 with RW, has been able to perform stand pivot transfers with use of RW and mod A, and otherwise has been dependent for transfers via use of maxi sky sling. Pt is making good progress to initiate gait training and wheelchair mobility over the next week. ? ?Patient continues to demonstrate the following deficits muscle weakness and muscle joint tightness, decreased cardiorespiratoy endurance and decreased oxygen support, and decreased sitting balance, decreased standing balance, decreased postural control, and decreased balance strategies and therefore will continue to benefit from skilled PT intervention to increase functional independence with mobility. ? ?Patient progressing toward long term goals..  Continue plan of care. ? ?PT Short Term Goals ?Week 1:  PT Short Term Goal 1 (Week 1): Pt will perform bed mobility wiht max assist of 1. ?PT Short Term Goal 1 - Progress (Week 1): Met ?PT Short Term Goal 2 (Week 1): Pt will tolerate sitting in recliner for >2 hours throughout the day. ?PT Short Term Goal 2 - Progress (Week 1): Met ?PT Short Term Goal 3 (Week 1): Pt will transfer out of bed with max assist for safety. ?PT Short Term Goal 3 - Progress (Week 1): Progressing toward goal ?Week 2:  PT Short Term Goal 1 (Week 2): Pt will perform least restrictive transfer with mod A x 1 consistently ?PT Short Term Goal 2 (Week 2): Pt  will initiate gait training ?PT Short Term Goal 3 (Week 2): Pt will initiate w/c mobility ? ?Skilled Therapeutic Interventions/Progress Updates:  ?  Pt received seated in bed, agreeable to PT session. Pt reports improved comfort with use of Kreg bed as compared to previous air mattress. Pt with some complaints of B shoulder and knee pain with mobility that mostly resolves at rest. Provided short-acting hot packs for B shoulder pain at end of session. Supine to sit with max A needed for LE management and trunk elevation. Pt unable to place BLE on the floor while seated EOB due to bed height. Placed 4" step under BLE. Pt able to perform sit to stand x 2 reps with assist x 2 (max x 1 and min x 1 with progression to mod A x 1 and SBA x 1). Pt tolerates standing x 30 sec at most with flexed trunk posture noted. Pt agreeable to transfer to shuttle chair but unable to perform stand pivot to chair due to seat height. Nursing secretary notified and obtained bariatric-size w/c at end of therapy session. Pt able to stand for maxi sky sling to be placed. Maxi sky transfer EOB to shuttle chair. Seated UE strengthening task performing 2# dowel rod volleyball 3 x 15 reps to fatigue. Pt exhibits decreased AROM and strength in L shoulder as compared to R shoulder. Seated PROM in flexion and abduction for B shoulders with significant muscle guarding noted, however able to achieve increase in PROM past 90 degrees in B shoulders  for flexion and abduction. Pt left seated in shuttle chair at end of session with needs in reach. Pt on 2L O2 via [place] throughout session, SpO2 drops to 85% with activity but returns to 90% (+) with use of pursed lip breathing techniques and seated rest breaks. ? ?Therapy Documentation ?Precautions:  ?Precautions ?Precautions: Fall ?Restrictions ?Weight Bearing Restrictions: No ? ? ? ? ? ?Therapy/Group: Individual Therapy ? ? ?[name], PT, DPT, CSRS ?04/15/2022, 12:06 PM  ?

## 2022-04-15 NOTE — Progress Notes (Signed)
RT still waiting on vendor for home settings ?

## 2022-04-15 NOTE — Progress Notes (Shared)
Occupational Therapy Weekly Progress Note ? ?Patient Details  ?Name: Stacy Hanson ?MRN: [id number] ?Date of Birth: 1951-09-08 ? ?Beginning of progress report period: April 08, 2022 ?End of progress report period: April 15, 2022 ? ?{CHL IP REHAB OT TIME CALCULATIONS:304400400} ? ? ?Patient has met {number 1-5:22450} of {number 1-5:20334} short term goals.  *** ? ?Patient continues to demonstrate the following deficits: {impairments:3041632} and therefore will continue to benefit from skilled OT intervention to enhance overall performance with {ADL/iADL:3041649}. ? ?Patient {LTG progression:3041653}.  {plan of IYJG:9494473} ? ?OT Short Term Goals ?Week 1:  OT Short Term Goal 1 (Week 1): Pt will be able to dress UB with mod A at the EOB ?OT Short Term Goal 2 (Week 1): Pt will toleate sitting out of the bed for 2 hrs at a time ?OT Short Term Goal 3 (Week 1): Pt will be able to perform 2 grooming tasks with mod A ?OT Short Term Goal 4 (Week 1): Pt will transition to EOB in hospital bed with max A +1 ?Week 2:    ? ?Skilled Therapeutic Interventions/Progress Updates:  ?   ? ?Therapy Documentation ?Precautions:  ?Precautions ?Precautions: Fall ?Restrictions ?Weight Bearing Restrictions: No ?General: ?  ?Vital Signs: ? ?Pain: ?Pain Assessment ?Pain Scale: 0-10 ?Pain Score: 4  ?Pain Type: Chronic pain ?Pain Location: Generalized ?Pain Orientation: Right;Left ?Pain Radiating Towards: legs, back, arms ?Pain Descriptors / Indicators: Aching;Discomfort ?Pain Frequency: Intermittent ?Pain Onset: On-going ?Patients Stated Pain Goal: 2 ?Pain Intervention(s): Medication (See eMAR);Repositioned ?ADL: ?ADL ?Grooming: Minimal assistance ?Upper Body Bathing: Maximal assistance ?Where Assessed-Upper Body Bathing: Edge of bed ?Lower Body Bathing: Dependent ?Where Assessed-Lower Body Bathing: Edge of bed ?Upper Body Dressing: Maximal assistance ?Where Assessed-Upper Body Dressing: Edge of bed ?Lower Body Dressing: Dependent ?Where  Assessed-Lower Body Dressing: Edge of bed ?Toileting: Not assessed ?Walk-In Shower Transfer: Not assessed ?Vision ?  ?Perception  ?  ?Praxis ?  ?Exercises: ?  ?Other Treatments:   ? ? ?Therapy/Group: Individual Therapy ? ?Stacy Hanson ?04/15/2022, 7:55 AM  ?

## 2022-04-15 NOTE — Progress Notes (Signed)
Physical Therapy Session Note ? ?Patient Details  ?Name: Stacy Hanson ?MRN: [id number] ?Date of Birth: 1951-05-22 ? ?Today's Date: 04/15/2022 ?PT Individual Time: 0175-1025 ?PT Individual Time Calculation (min): 55 min  ? ?Short Term Goals: ?Week 1:  PT Short Term Goal 1 (Week 1): Pt will perform bed mobility wiht max assist of 1. ?PT Short Term Goal 2 (Week 1): Pt will tolerate sitting in recliner for >2 hours throughout the day. ?PT Short Term Goal 3 (Week 1): Pt will transfer out of bed with max assist for safety. ? ?Skilled Therapeutic Interventions/Progress Updates: Pt presented in bed agreeable to therapy. Pt states has had several BM's today and concerned that any mobility will cause another BM. Discussed with pt that with mobility can expect some output especially as she has not been very mobile. Pt agreeable to attempt stand pivot transfer to [facility]. With increased time and use of bed features pt was able to perform supine to sit with light modA (mainly for truncal support). Pt was then able to perform stand pivot transfer minA x 2 to [facility] with heavy duty RW. PTA assisting with RW and line management. Pt required maxA for brief management. Pt able to transfer to [facility] and have continent BM! Pt able to stand with minA x 1 and PTA completed peri-care total A. Pt then transferred back to bed and was able to take side steps to [facility] with minA. Pt required maxA x 2 to return to supine and dependent x 2 to boost to [facility]. Bed then placed in chair position and pt participated in ball taps with 1lb dowel x 3 min for general conditioning. Pt left in bed at end of session with husband present, call bell within reach and nsg present.   ?   ? ?Therapy Documentation ?Precautions:  ?Precautions ?Precautions: Fall ?Restrictions ?Weight Bearing Restrictions: No ?General: ?  ?Vital Signs: ?Therapy Vitals ?Temp: 98 ?F (36.7 ?C) ?Temp Source: Oral ?Pulse Rate: 79 ?Resp: 17 ?BP: 139/64 ?Patient Position (if appropriate):  Sitting ?Oxygen Therapy ?SpO2: 96 % ?O2 Device: Room Air ?Pain: ?Pain Assessment ?Pain Score: 4  ?Pain Location: Generalized ?Pain Descriptors / Indicators: Aching;Tender;Numbness;Constant;Restless ?Pain Intervention(s): Medication (See eMAR);RN made aware;Relaxation;Pain med given for lower pain score than stated, per patient request;Rest;Repositioned ?Mobility: ?  ?Locomotion : ?   ?Trunk/Postural Assessment : ?   ?Balance: ?  ?Exercises: ?  ?Other Treatments:   ? ? ? ?Therapy/Group: Individual Therapy ? ?Haidyn Kilburg ?04/15/2022, 3:41 PM  ?

## 2022-04-16 DIAGNOSIS — R5381 Other malaise: Secondary | ICD-10-CM | POA: Diagnosis not present

## 2022-04-16 NOTE — Progress Notes (Signed)
Patient ID: Stacy Hanson, female   DOB: 12-07-1951, 71 y.o.   MRN: [id number] ? ?SW informed by [employer] that an RT intends to come to hospital today to set BiPAP settings.  ? ?[name], MSW, LCSWA ?Office: [phone] ?Cell: [phone] ?Fax: [phone]  ?

## 2022-04-16 NOTE — Progress Notes (Signed)
Physical Therapy Session Note ? ?Patient Details  ?Name: Stacy Hanson ?MRN: [id number] ?Date of Birth: Apr 11, 1951 ? ?Today's Date: 04/16/2022 ?PT Individual Time: 1005-1103 ?PT Individual Time Calculation (min): 58 min  ? ?Short Term Goals: ?Week 1:  PT Short Term Goal 1 (Week 1): Pt will perform bed mobility wiht max assist of 1. ?PT Short Term Goal 1 - Progress (Week 1): Met ?PT Short Term Goal 2 (Week 1): Pt will tolerate sitting in recliner for >2 hours throughout the day. ?PT Short Term Goal 2 - Progress (Week 1): Met ?PT Short Term Goal 3 (Week 1): Pt will transfer out of bed with max assist for safety. ?PT Short Term Goal 3 - Progress (Week 1): Progressing toward goal ?PT Short Term Goal 1 (Week 2): Pt will perform least restrictive transfer with mod A x 1 consistently ?PT Short Term Goal 2 (Week 2): Pt will initiate gait training ?PT Short Term Goal 3 (Week 2): Pt will initiate w/c mobility ? ?Skilled Therapeutic Interventions/Progress Updates: Pt presented in w/c agreeable to therapy. Pt c/o numbness in BLE due to sitting in chair. Pt transported to day room for time management and once PTA removed leg rests pt states felt immediate relief. Pt set up at [place] and was able to perform activity x 10 min for ROM and general conditioning at 40 then 30 cm/sec. Pt also participated in hamstring pulls with red theraband to fatigue ~15 bilaterally. Pt indicating use of donut in w/c and was able to tolerate ~1hour prior to pain at coccyx. Pt requesting to return to bed. Pt transported back to room and performed stand pivot transfer to bed from w/c with modA x 1 (+2 present for safety). Pt noted to lean to R placing almost entire RLE on RW. PTA instructing pt to reposition UE with pt able to but unable to maintain (?)anxiety. Once transferred to EOB pt requesting to use urinal for urinary void. Pt able to sit EOB and PTA placed urinal total A for continent  void. Pt then required maxA x 2 for sit to  supine. Pt repositioned to comfort and left with call bell within reach and needs met.  ?   ? ?Therapy Documentation ?Precautions:  ?Precautions ?Precautions: Fall ?Restrictions ?Weight Bearing Restrictions: No ?General: ?  ?Vital Signs: ? ?Pain: ?Pain Assessment ?Pain Scale: 0-10 ?Pain Score: 0-No pain ?Faces Pain Scale: No hurt ? ? ?Therapy/Group: Individual Therapy ? ?Kianni Lheureux ?04/16/2022, 12:28 PM  ?

## 2022-04-16 NOTE — Progress Notes (Addendum)
Patient C/O being washed up several times a day. Patent states it is bothering her skin. Under patients left breast pink irritation noted. Refused soap and water wash, applied powder and ABD pad for comfort.  ?

## 2022-04-16 NOTE — Progress Notes (Signed)
?                                                       PROGRESS NOTE ? ? ?Subjective/Complaints: ? ? ?Pt much happier with Kreg bed- ?LBM yesterday still using female urinal when possible.  ?Got to toilet/BSC yesterday with therapy- took a lot of people, per pt, but got there! ? ?Ate 100% tray- ?Doesn't like O2 when eating- puts back on afterwards.  ? ? ? ?ROS:  ? ?Pt denies SOB, abd pain, CP, N/V/C/D, and vision changes ? ?Objective: ?  ?No results found. ? ?Recent Labs  ?  04/15/22 ?1304  ?WBC 9.0  ?HGB 9.0*  ?HCT 29.7*  ?PLT 239  ? ? ? ?No results for input(s): NA, K, CL, CO2, GLUCOSE, BUN, CREATININE, CALCIUM in the last 72 hours. ? ? ? ?Intake/Output Summary (Last 24 hours) at 04/16/2022 1638 ?Last data filed at 04/16/2022 1506 ?Gross per 24 hour  ?Intake 957 ml  ?Output 1400 ml  ?Net -443 ml  ?  ? ?  ? ?Physical Exam: ?Vital Signs ?Blood pressure (!) 156/69, pulse 79, temperature 98.2 ?F (36.8 ?C), temperature source Oral, resp. rate 15, height 5\' 3"  (1.6 m), weight (!) 149.8 kg, SpO2 100 %. ? ? ? ? ? ? ? ? ? ?General: awake, alert, appropriate, BMI 58- up 5 kg???; in Kreg bed- sitting up more; NAD ?HENT: conjugate gaze; oropharynx moist- O2 by  ?CV: regular rate; no JVD ?Pulmonary: CTA B/L; no W/R/R- good air movement ?GI: soft, NT, ND, (+)BS- protuberant ?Psychiatric: appropriate ?Neurological: Ox3 ? ?Musculoskeletal:  ?   Comments: Limited ROM bilateral shoulder with stiffness and crepitus left elbow with attempts at ROM. No pain with PROM left shoulder.  1+ tibially and 2+ pedally.  .  Diffuse weakness.  ?Skin: ?   General: Skin is warm and dry.  ?Wounds with tegaderm on B/L ankles for chronic areas, both areas appear clean.  ? ? ?Assessment/Plan: ?1. Functional deficits which require 3+ hours per day of interdisciplinary therapy in a comprehensive inpatient rehab setting. ?Physiatrist is providing close team supervision and 24 hour management of active medical problems listed below. ?Physiatrist and  rehab team continue to assess barriers to discharge/monitor patient progress toward functional and medical goals ? ?Care Tool: ? ?Bathing ?   ?Body parts bathed by patient: Left upper leg, Right upper leg, Face  ? Body parts bathed by helper: Right arm, Left arm, Chest, Abdomen, Front perineal area, Buttocks, Right lower leg, Left lower leg ?  ?  ?Bathing assist Assist Level: Total Assistance - Patient < 25% ?  ?  ?Upper Body Dressing/Undressing ?Upper body dressing   ?What is the patient wearing?: Pull over shirt ?   ?Upper body assist Assist Level: Moderate Assistance - Patient 50 - 74% ?   ?Lower Body Dressing/Undressing ?Lower body dressing ? ? ?   ?What is the patient wearing?: Incontinence brief ? ?  ? ?Lower body assist Assist for lower body dressing:  (MAX A +2) ?   ? ?Toileting ?Toileting Toileting Activity did not occur Landscape architect and hygiene only):  (incontient)  ?Toileting assist Assist for toileting: 2 Helpers ?  ?  ?Transfers ?Chair/bed transfer ? ?Transfers assist ? Chair/bed transfer activity did not occur: Refused ? ?Chair/bed transfer assist level: 2 Helpers ?  ?  ?  Locomotion ?Ambulation ? ? ?Ambulation assist ? ? Ambulation activity did not occur: Safety/medical concerns ? ?  ?  ?   ? ?Walk 10 feet activity ? ? ?Assist ? Walk 10 feet activity did not occur: Safety/medical concerns ? ?  ?   ? ?Walk 50 feet activity ? ? ?Assist Walk 50 feet with 2 turns activity did not occur: Safety/medical concerns ? ?  ?   ? ? ?Walk 150 feet activity ? ? ?Assist Walk 150 feet activity did not occur: Safety/medical concerns ? ?  ?  ?  ? ?Walk 10 feet on uneven surface  ?activity ? ? ?Assist Walk 10 feet on uneven surfaces activity did not occur: Safety/medical concerns ? ? ?  ?   ? ?Wheelchair ? ? ? ? ?Assist Is the patient using a wheelchair?: Yes ?Type of Wheelchair: Manual (Per PT) ?Wheelchair activity did not occur: Refused ? ?  ?   ? ? ?Wheelchair 50 feet with 2 turns activity ? ? ? ?Assist ? ?   ?Wheelchair 50 feet with 2 turns activity did not occur: Refused ? ? ?   ? ?Wheelchair 150 feet activity  ? ? ? ?Assist ? Wheelchair 150 feet activity did not occur: Refused ? ? ?   ? ?Blood pressure (!) 156/69, pulse 79, temperature 98.2 ?F (36.8 ?C), temperature source Oral, resp. rate 15, height 5\' 3"  (1.6 m), weight (!) 149.8 kg, SpO2 100 %. ? ?Medical Problem List and Plan: ?1. Functional deficits secondary to debility- for complicated medical stay-CHF exacerbation/bacteremia ?            -patient may shower ?            -ELOS/Goals: 14-18 days [facility] PT and OT ?          set for 4 weeks-  ?Continue CIR- PT, OT  ?2.  Antithrombotics: ?-DVT/anticoagulation:  Mechanical: Sequential compression devices, below knee Bilateral lower extremities ?4/21- Dopplers (-) for DVTs ? 4/22- pt refusing to use SCDs- educated needs them to reduce risk of DVTs ?            -antiplatelet therapy: N/A due to GIB ?3. Left hip/shoulder pain/myalgias/ Pain Management: oxycodone prn for diffuse pain. ?4/20- will schedule Tramadol 100 mg q8 hours not q6 hours due to renal issues- Cr 1.31-  con't oxycodone for pain prn- was taking NSAIDs at home and caused GI bleed.  ?4/24- con't tramadol scheduled and oxy prn -  ?4/25- will not go home on Oxy- just tramadol ?4. Mood: LCSW to follow for evaluation and support.  ?            -antipsychotic agents: N/a ?5. Neuropsych: This patient is capable of making decisions on her own behalf. ?6. Skin/Wound Care: Pressure relief measures.  ?--Monitor for recurrent peripheral edema with increase in activity.  ?--modify FR as indicated. ?4/20- on low air loss mattress- con't that and 12M tegaderm for ankles B/L per WOC  ?4/24 understands that she has to remain on air mattress ? -requests that pressure kept high ? 4/27- switched to Kreg bed.  ? 4/28- much improved pain with ROHO in chair and Kreg bed for coccyx pain ?7. Fluids/Electrolytes/Nutrition: Monitor I/O. Check CMET in am.  ?8. Enterococcus fecalis  bacteremia: On Ampicillin and ceftriaxone with end date 05/07 ?9. ABLA due to GIB:  ON PPI BID. Will add iron supplement with Miralax daily ?--check anemia panel as question Vitamin B12 and iron deficiency.   ? 4/20- has  iron deficiency ? 4/21- on IV Iron as well as PO ? 4/24 hgb up to 8.2 ?  -encouraged appropriate diet, supps as above ?10. CAF: Monitor HR TID--continue metoprolol. ?4/21- rate controlled- con't regimen  ?11. Chronic diastolic CHF/fluid overload: Heart healthy diet. Strict I/O. ?-- 2000 cc (IV+ PO) FR--will change to 1000 cc po as getting IV every 4 hours.  ?            --monitor for orthostatic changes (due to prior hx) ?            --continue Lasix, metoprolol and spironolactone.  ? 4/27- weight very off- 67 kg is incorrect- will ask nursing to fix.  ?Filed Weights  ? 04/13/22 0436 04/15/22 0500 04/16/22 0538  ?Weight: (!) 138.8 kg (!) 149.8 kg (!) 149.8 kg  ?  -weights inconsistent, likely from technique. Still quite a bit lower from admit weight--check weights daily ? 4/28- weight very inconsistent- will see if can get more stable, since unlikely changing this much ?12. Restless leg syndrome: Continue with Requip 2 mg bid and 3 mg hs.  ?13. OSA/OHS: Hypercarbia noted but improved from 43-->35. ?-- Encourage BIPAP use as able to tolerate it for 1- 3 hours thorough the night.  ?--check ABG for baseline/BIPAP justification if needed at discharge  ?--Wean oxygen as tolerated. Encourage pulmonary hygiene.  ? 4/20- pt having husband bring in from home ? 4/24 on 2L O2 [place] currently ? 4/27- Home settings in SW notes- will let RT know ? 4/28- let pt know as well ?14. Morbid obesity: BMI 60.77. Educate on appropriate diet to promote mobility and health.  ? 4/20- BMI down slightly to 56.15- but likely due to bed change ? 4/22- BMI 53- due to change of bed.  ? 4/25- BMI 54/.21 ? 4/27- BMI in computer as 26- is documented incorrectly- will ask nursing to fix ?15. Peripheral edema: Greatly improved due to  elevation and limited mobility.  ?            --educated on importance of low salt diet and elevation when seated.   ?            --continue protein supplement for likely low protein stores ?            --contin

## 2022-04-16 NOTE — Progress Notes (Signed)
Occupational Therapy Weekly Progress Note ? ?Patient Details  ?Name: Stacy Hanson ?MRN: [id number] ?Date of Birth: 02/05/1951 ? ?Beginning of progress report period: April 08, 2022 ?End of progress report period: April 16, 2022 ? ? ?Patient has met 1 of 4 short term goals.  Pt is make steady progress. Pt has been been able to tolerate sitting out of the bed in the shuttle chair for at least 2 hrs but still requires the lift to get into chair. Pt still requires max to total A +2 for bathing and dressing sit to stand at EOB. Pt also continues to be impacted by lack of ROM in her shoulders left >right. This week pt has performed a stand pivot with bari wide walker with max A to come into standing and in supportive min A to pivot with extra time. Pt does present with sensitive to the touch feet. She also continues to be incontinent of bowel and bladder.  ? ?Patient continues to demonstrate the following deficits: muscle weakness and lack of AROM in shoulders, decreased cardiorespiratoy endurance, and decreased sitting balance, decreased standing balance, decreased postural control, and decreased balance strategies and therefore will continue to benefit from skilled OT intervention to enhance overall performance with BADL and Reduce care partner burden. ? ?Patient progressing toward long term goals..  Continue plan of care. ? ?OT Short Term Goals ?Week 1:  OT Short Term Goal 1 (Week 1): Pt will be able to dress UB with mod A at the EOB ?OT Short Term Goal 1 - Progress (Week 1): Progressing toward goal ?OT Short Term Goal 2 (Week 1): Pt will toleate sitting out of the bed for 2 hrs at a time ?OT Short Term Goal 2 - Progress (Week 1): Met ?OT Short Term Goal 3 (Week 1): Pt will be able to perform 2 grooming tasks with mod A ?OT Short Term Goal 3 - Progress (Week 1): Progressing toward goal ?OT Short Term Goal 4 (Week 1): Pt will transition to EOB in hospital bed with max A +1 ?OT Short Term Goal 4 - Progress (Week 1):  Not met ?Week 2:  OT Short Term Goal 1 (Week 2): Pt will be able to get to EOB with max A +1 ?OT Short Term Goal 2 (Week 2): Pt will perform stand pivot transfer to BSC with RW with mod A ?OT Short Term Goal 3 (Week 2): Pt will don shirt sitting EOB with mod A ?OT Short Term Goal 4 (Week 2): Pt will perform 2 grooming tasks with setup (ie wash face, brush teeth) ? ?Skilled Therapeutic Interventions/Progress Updates:  ?  Continue with POC ? ?Therapy Documentation ?Precautions:  ?Precautions ?Precautions: Fall ?Restrictions ?Weight Bearing Restrictions: No ? ? ? ?Therapy/Group: Individual Therapy ? ?Smith, Jennifer Lynsey ?04/16/2022, 8:44 AM  ?

## 2022-04-16 NOTE — Progress Notes (Signed)
Occupational Therapy Session Note ? ?Patient Details  ?Name: Stacy Hanson ?MRN: [id number] ?Date of Birth: 1951-01-17 ? ?Today's Date: 04/16/2022 ?OT Individual Time: 2993-7169 ?OT Individual Time Calculation (min): 60 min  ? ? ?Short Term Goals: ?Week 2:  OT Short Term Goal 1 (Week 2): Pt will be able to get to EOB with max A +1 ?OT Short Term Goal 2 (Week 2): Pt will perform stand pivot transfer to [facility] with RW with mod A ?OT Short Term Goal 3 (Week 2): Pt will don shirt sitting EOB with mod A ?OT Short Term Goal 4 (Week 2): Pt will perform 2 grooming tasks with setup (ie wash face, brush teeth) ? ?Skilled Therapeutic Interventions/Progress Updates:  ?  Treatment session with focus on UB bathing, bed mobility, sit > stand and stand pivot transfers, and functional use of BUE during self-care tasks.  Pt received in bed with nursing staff assisting pt with bathing and dressing.  Therapist encouraged pt to attempt to don shirt instead of staff completing for her.  Pt able to bring BUE to head to pull shirt over head with forward neck flexion to fully advance shirt over head.  Therapist provided mod-max assist to assist pt with sitting forward to then pull shirt down trunk.  Pt completed bed mobility with mod assist and more than reasonable amount of time to advance BLE towards EOB.  Therapist again assisting at trunk to assist with weight shift.  Pt completed sit > stand from EOB with bari RW and min assist of 1 and +2 for safety.  Pt completed stand pivot to w/c with bari RW and min assist of 1, only +2 for safety.  Engaged in grooming tasks seated at sink with UE supported on sink.  Pt able to brush teeth with setup and wash face.  Therapist assisted pt with washing and brushing hair due to decreased ROM in BUE to allow pt to reach to top or back of head.  Pt remained upright in w/c with leg rests on and towel supports along lateral sides and under feet for increased support/pressure relief. Pt passed off to  PT. ? ?Therapy Documentation ?Precautions:  ?Precautions ?Precautions: Fall ?Restrictions ?Weight Bearing Restrictions: No ? ?Pain: ?Pain Assessment ?Pain Scale: 0-10 ?Pain Score: 0-No pain ?Faces Pain Scale: No hurt ? ? ?Therapy/Group: Individual Therapy ? ?Rosalio Loud ?04/16/2022, 10:27 AM ?

## 2022-04-16 NOTE — Progress Notes (Signed)
Physical Therapy Session Note ? ?Patient Details  ?Name: Stacy Hanson ?MRN: [id number] ?Date of Birth: [birth year] ? ?Today's Date: 04/16/2022 ?PT Individual Time: 7322-0254 ?PT Individual Time Calculation (min): 55 min  ? ?Short Term Goals: ?Week 2:  PT Short Term Goal 1 (Week 2): Pt will perform least restrictive transfer with mod A x 1 consistently ?PT Short Term Goal 2 (Week 2): Pt will initiate gait training ?PT Short Term Goal 3 (Week 2): Pt will initiate w/c mobility ? ?Skilled Therapeutic Interventions/Progress Updates:  ?  Patient received supine in bed, RN and NT present, agreeable to PT. She denies pain, but reports muscle soreness. Patient incontinent of urine. Able to roll using bed features and ModA. TotalA pericare at bedlevel and placing brief. Patient requesting to complete UE exercises: isometric scap retraction, short range shoulder abduction, bicep curls, iso lat push ups. CPAP rep present to adjust patients equipment. Patient remaining in bed, rails up, needs within reach.  ? ?Therapy Documentation ?Precautions:  ?Precautions ?Precautions: Fall ?Restrictions ?Weight Bearing Restrictions: No ? ? ? ? ?Therapy/Group: Individual Therapy ? ?Westley Foots ?Westley Foots, PT, DPT, CBIS ? ?04/16/2022, 7:54 AM  ?

## 2022-04-17 ENCOUNTER — Inpatient Hospital Stay (HOSPITAL_COMMUNITY): Payer: Medicare Other

## 2022-04-17 DIAGNOSIS — R5381 Other malaise: Secondary | ICD-10-CM | POA: Diagnosis not present

## 2022-04-17 NOTE — Progress Notes (Signed)
PROGRESS NOTE   Subjective/Complaints: Patient seen today happy with new Kreg bed.  However she reports that she had some difficulty with her CPAP last night.  She said it was difficult to set up the CPAP.  And now due to having issues with raising her left shoulder she finds it difficult to take on and off the CPAP.  She also expresses having a rash under her left breast.  And also having some swelling in her abdomen.  She ate 100% of her tray and is not currently on oxygen at 2 L.  ROS: Patient denies shortness of breath abdominal pain chest pain nausea vomiting constipation diarrhea or visual changes.   Objective:   No results found. Recent Labs    04/15/22 1304  WBC 9.0  HGB 9.0*  HCT 29.7*  PLT 239   No results for input(s): NA, K, CL, CO2, GLUCOSE, BUN, CREATININE, CALCIUM in the last 72 hours.  Intake/Output Summary (Last 24 hours) at 04/17/2022 1856 Last data filed at 04/17/2022 1835 Gross per 24 hour  Intake 800 ml  Output 800 ml  Net 0 ml        Physical Exam: General: Awake alert appropriate, BMI is 58 possibly up 5 kg??  Constitutional: No distress . Vital signs reviewed. HENT: Conjugate gaze oropharynx moist oxygen by nasal cannula normocephalic.  Atraumatic. Eyes: EOMI. No discharge. Cardiovascular: RRR. No JVD. Respiratory: CTA Bilaterally. Normal effort.  Good air movement W/R/R GI: BS +. Non-distended.  Nontender. Protuberant.  bowel sounds, positive all 4 quadrants.  No rebound and soft. Neurological alert and oriented to person place time situation Musculoskeletal: Comments: Limited ROM bilateral shoulder due to stiffness with crepitus observed left elbow with attempts at ROM.  However no pain with PROM of left shoulder.  1+ tibially and 2+ POE.  Diffuse weakness Skin: General: Skin warm and dry.  Wounds with Tegaderm on bilateral ankles for chronic areas however both appear clean. +1-2 edema to feet  and ankles. Psychiatric: Appropriate  Vital Signs Blood pressure 127/68, pulse 74, temperature 97.9 F (36.6 C), temperature source Oral, resp. rate 18, height 5\' 3"  (1.6 m), weight (!) 148.1 kg, SpO2 94 %.    Assessment/Plan: 1. Functional deficits which require 3+ hours per day of interdisciplinary therapy in a comprehensive inpatient rehab setting. Physiatrist is providing close team supervision and 24 hour management of active medical problems listed below. Physiatrist and rehab team continue to assess barriers to discharge/monitor patient progress toward functional and medical goals  Care Tool:  Bathing    Body parts bathed by patient: Face, Chest   Body parts bathed by helper: Right arm, Left arm, Buttocks, Front perineal area, Abdomen     Bathing assist Assist Level: 2 Helpers (second helper for rolling, lifting legs for pericare)     Upper Body Dressing/Undressing Upper body dressing   What is the patient wearing?: Pull over shirt    Upper body assist Assist Level: Moderate Assistance - Patient 50 - 74%    Lower Body Dressing/Undressing Lower body dressing      What is the patient wearing?: Incontinence brief     Lower body assist Assist for lower body  dressing: 2 Helpers     Financial trader Activity did not occur Press photographer and hygiene only):  (incontient)  Toileting assist Assist for toileting: 2 Helpers     Transfers Chair/bed transfer  Transfers assist  Chair/bed transfer activity did not occur: Refused  Chair/bed transfer assist level: 2 Helpers     Locomotion Ambulation   Ambulation assist   Ambulation activity did not occur: Safety/medical concerns          Walk 10 feet activity   Assist  Walk 10 feet activity did not occur: Safety/medical concerns        Walk 50 feet activity   Assist Walk 50 feet with 2 turns activity did not occur: Safety/medical concerns         Walk 150 feet  activity   Assist Walk 150 feet activity did not occur: Safety/medical concerns         Walk 10 feet on uneven surface  activity   Assist Walk 10 feet on uneven surfaces activity did not occur: Safety/medical concerns         Wheelchair     Assist Is the patient using a wheelchair?: Yes Type of Wheelchair: Manual (Per PT) Wheelchair activity did not occur: Refused         Wheelchair 50 feet with 2 turns activity    Assist    Wheelchair 50 feet with 2 turns activity did not occur: Refused       Wheelchair 150 feet activity     Assist  Wheelchair 150 feet activity did not occur: Refused       Blood pressure 127/68, pulse 74, temperature 97.9 F (36.6 C), temperature source Oral, resp. rate 18, height 5\' 3"  (1.6 m), weight (!) 148.1 kg, SpO2 94 %.  Medical Problem List and Plan: 1. Functional deficits secondary to debility-for complicated medical stay-CHF exacerbation and bacteremia            - Patient may shower             -ELOS/Goals: 14 to 18 days [facility] PT and OT             Set for 4 weeks             -Continue CIR-PT and OT 2.  Antithrombotics: -DVT/anticoagulation: Mechanical: Sequential compression devices,   below-knee bilateral lower extremities            -04/09/2022 Dopplers negative for DVT's 4/22-patient refusing to use SCDs however educated on need to use them at risk for DVT's             -antiplatelet therapy: N/A due to GIB 3.  Left hip and shoulder pain with myalgias, pain management: Oxycodone as needed for diffuse pain. 04/08/2022-we will schedule tramadol 100 mg every 8 hours not every 6 hours due to renal issues-creatinine 1.31-continue oxycodone for pain as needed-was taking NSAIDs at home which caused a GI bleed. 04/12/2022-continue tramadol scheduled and oxycodone as needed-04/13/2022-will not go home on oxycodone just tramadol 04/17/2022 patient is well controlled under current pain regimen 4. Mood: LCSW to follow for  evaluation and support. - Antipsychotic agents: Not applicable 5. Neuropsych: This patient is capable of making decisions on her own behalf. 6. Skin/Wound Care: Pressure relief measures. - Monitor for recurrent peripheral edema with increase in activity. Modify RF as indicated.    -04/08/2022 on low air loss mattress: Continue that along with 54M Tegaderm for ankles bilaterally per WOC    -04/12/2022 understands  that she will have to remain on direct mattress     -Request that pressure is High    -04/15/22 switch to Kreg bed.    -04/16/2022-much improved pain with ROHO in chair and Kreg bed for coccyx pain    -04/17/2022 has adjusted to Kreg bed she continues to have 21M Tegaderm for ankles per WOC 7. Fluids/Electrolytes/Nutrition: Routine in and outs with follow-up chemistries   -4/29 Continue to monitor chemistries.     Component Value Date/Time   NA 139 04/12/2022 0435   K 3.8 04/12/2022 0435   CL 98 04/12/2022 0435   CO2 36 (H) 04/12/2022 0435   GLUCOSE 90 04/12/2022 0435   BUN 14 04/12/2022 0435   CREATININE 0.96 04/12/2022 0435   CALCIUM 8.9 04/12/2022 0435   GFRNONAA >60 04/12/2022 0435    8.  Enterococcus fecalis bacteremia: On ampicillin and ceftriaxone with end date of 04/25/22 -04/17/2022 continue ampicillin and ceftriaxone 9.  ABLA due to UGIB: ON PPI BID.  We will add iron supplement with MiraLAX daily.   -Check anemia panel is question vitamin B12 and iron deficiency.    -4/20-has iron deficiency    -4/21-on IV iron as well as PO    -4/24 Hgb up to 8.2    -Encouraged appropriate diet, with supplements as above.     -4/29 Hgb stable at 9.0, continue to monitor    CBC    Component Value Date/Time   WBC 9.0 04/15/2022 1304   RBC 3.36 (L) 04/15/2022 1304   HGB 9.0 (L) 04/15/2022 1304   HCT 29.7 (L) 04/15/2022 1304   PLT 239 04/15/2022 1304   MCV 88.4 04/15/2022 1304   MCH 26.8 04/15/2022 1304   MCHC 30.3 04/15/2022 1304   RDW 17.2 (H) 04/15/2022 1304   LYMPHSABS 1.1  04/08/2022 0603   MONOABS 0.6 04/08/2022 0603   EOSABS 0.4 04/08/2022 0603   BASOSABS 0.1 04/08/2022 0603   10. CAF: Monitor HR TID-Continue metoprolol. 4/21-rate controlled-continue regimen 4/29 HR stable, in the 70's     04/17/2022    1:36 PM 04/17/2022    5:47 AM 04/17/2022    4:54 AM  Vitals with BMI  Weight  326 lbs 8 oz   BMI  57.85   Systolic 127  166  Diastolic 68  73  Pulse 74  76    11. Chronic diastolic CHF/fluid overload: Heart Healthy diet. Strict I/O.   --2,000 cc (IV + PO) FR-will change to 1,000 cc po as getting IV every 4 hours. --Monitor for orthostatic changes (due to prior history) --Continue Lasix, metoprolol, and spironolactone. -4/27 weight very off-67 kg incorrect, nursing requested to fix. 4/28 weight very inconsistent, may be due to technique, will see if can get more stable. -4/29 Wt 148.1 kg, continue to monitor daily weights.  Filed Weights   04/15/22 0500 04/16/22 0538 04/17/22 0547  Weight: (!) 149.8 kg (!) 149.8 kg (!) 148.1 kg    12.  Restless restless leg syndrome: Continue with Requip 2 mg twice daily and 3 mg at bedtime. 13.  OSA/OHS: Hypercarbia noted but improved from 43-35. - Encourage BiPAP use as able to tolerate it for 1 to 3 hours throughout the night.   - Check ABG for baseline/BiPAP justification if needed at discharge   - Wean oxygen as tolerated.  Encourage pulmonary hygiene.   - 04/08/2022-patient having husband bring it from    -04/12/22 on 2 L O2 nasal cannula currently    -04/15/2022-home setting and social  work notes-we will let our team know    -04/16/22-let patient know as well    -04/17/2022 patient reports doing BiPAP overnight however having some issues with claustrophobia and being able to take the mask on and off due to limited range of motion in left arm.  14.  Morbid obesity: BMI 60.77.  Educate on appropriate diet to promote mobility and health.    -04/08/2022-BMI down slightly to 56.15 but could be due to today change     -04/10/2022 BMI 53-due to the change in bed.     -04/13/2022-BMI 54.21     -04/15/2022-BMI in computer is 26 asking nursing to fax     -04/17/2022-Wt 148.1 kg, continue to monitor daily weights. 15.  Peripheral edema: Greatly improved due to elevation and limited mobility. - Educated on importance of low-salt diet and elevation when seated. Continue protein supplement for likely low protein stores. Continue compression stockings or Ace wrap to prevent worsening of edema. 04/08/2022 edema looks better this morning 2+ edema continue teds -04/17/2022 still has 1+1 to +2 edema continue measures as outlined above.  Patient also encouraged to have high-protein diet. 16.  Wounds 04/08/2022-on low air loss mattress-patient said not as big as 1 upstairs-explained to have a different hospital and already on bariatric low-air-loss mattress that we have. -04/17/2022 K reticulocyte bed also seeing wound care consult for care bilateral lower extremity wounds 48M Tegaderm 17.  Bilateral lower extremity wounds-venous stasis ulcers-not pressure       -04/09/22-continue 48M Tegaderm-can be left on for 21 to 28 days per wound ostomy care nurse       -04/12/2022 continue air mattress see discussion above       -04/17/2022 continue both air mattress and 48M Tegaderm 18.  Urinary and bowel incontinence -04/11/2022 better with female urine normal continue current regimen -4/49/2023 patient states female urinal is working well for her    LOS: 10 days A FACE TO FACE EVALUATION WAS PERFORMED  Tressia Miners, FNP 04/17/2022, 6:56 PM

## 2022-04-17 NOTE — Progress Notes (Addendum)
Received call that PICC line clogged, not working from [place], [employer] [facility].  IV team could not unclog.  Order placed for CXR to confirm placement.  Antibiotics stopped until placement can be confirmed. ?

## 2022-04-17 NOTE — Progress Notes (Signed)
Occupational Therapy Session Note ? ?Patient Details  ?Name: Stacy Hanson ?MRN: [id number] ?Date of Birth: [birth year] ? ?Today's Date: 04/17/2022 ?OT Individual Time: 1308-6578 ?OT Individual Time Calculation (min): 28 min  ? ? ?Short Term Goals: ?Week 1:  OT Short Term Goal 1 (Week 1): Pt will be able to dress UB with mod A at the EOB ?OT Short Term Goal 1 - Progress (Week 1): Progressing toward goal ?OT Short Term Goal 2 (Week 1): Pt will toleate sitting out of the bed for 2 hrs at a time ?OT Short Term Goal 2 - Progress (Week 1): Met ?OT Short Term Goal 3 (Week 1): Pt will be able to perform 2 grooming tasks with mod A ?OT Short Term Goal 3 - Progress (Week 1): Progressing toward goal ?OT Short Term Goal 4 (Week 1): Pt will transition to EOB in hospital bed with max A +1 ?OT Short Term Goal 4 - Progress (Week 1): Not met ? ?Skilled Therapeutic Interventions/Progress Updates:  ?   ?Pt received in bed with unrated pain in L shoulder  ?ADL: ?Session focus on problem solving self donning and doffing of CPAP machine to improve independence at night. With cuing from OT and multiple attempts pt able ot don/doff CPAP using reacher to stretch back cpap strap overhead while holding on to L side clip. Pt able to reach around to back to put on R side clip. Once sequence solved. Pt practices 3x till she no longer needs cuing. Educated can use reacher as well to thread head into shirt. Provided ice for L shoulder and pt verbalized taking off after 20 min.  ? ?Pt left at end of session in bed with exit alarm on, call light in reach and all needs met ? ? ?Therapy Documentation ?Precautions:  ?Precautions ?Precautions: Fall ?Restrictions ?Weight Bearing Restrictions: No ?General: ?  ? ?Therapy/Group: Individual Therapy ? ?Lowella Dell Kayce Betty ?04/17/2022, 6:54 AM ?

## 2022-04-17 NOTE — Progress Notes (Signed)
Occupational Therapy Session Note ? ?Patient Details  ?Name: Stacy Hanson ?MRN: [id number] ?Date of Birth: [birth year] ? ?Today's Date: 04/17/2022 ?OT Individual Time: 1005-1100 ?OT Individual Time Calculation (min): 55 min  ? ? ?Short Term Goals: ?Week 2:  OT Short Term Goal 1 (Week 2): Pt will be able to get to EOB with max A +1 ?OT Short Term Goal 2 (Week 2): Pt will perform stand pivot transfer to [facility] with RW with mod A ?OT Short Term Goal 3 (Week 2): Pt will don shirt sitting EOB with mod A ?OT Short Term Goal 4 (Week 2): Pt will perform 2 grooming tasks with setup (ie wash face, brush teeth) ? ?Skilled Therapeutic Interventions/Progress Updates:  ?  Patient received in reclined supine in bed stating she needed to be changed.  Assisted patient rolling left and right to aide in perianal hygiene and donning clean brief/bedding.  Patient reported that she often feels dizzy with rolling, however, if speed controlled no symptoms or reduced symptoms.  On a few occasions patient rolled quickly without control from side to supine, then she needed 5-10 sec rest break to settle symptoms of dizziness.  Cues self to breathe in through nose and out thru mouth.   ?Patient assisted to sit at edge of bed to bathe and change her shirt.  Patient reports not liking washcloths as they hurt her skin.  Patient given moistened towelettes to wash self.  Patient with rash under left breast from moisture - able to direct care to wash, apply cream/powder and gauze pad.  Patient assisted back to supine with call bell in reach.   ? ?Therapy Documentation ?Precautions:  ?Precautions ?Precautions: Fall ?Restrictions ?Weight Bearing Restrictions: No ? ? ?Pain:  Reporting pain - did not score in left shoulder "feel like I may have really worked it yesterday" ? ? ? ?Therapy/Group: Individual Therapy ? ?Mariah Milling ?04/17/2022, 12:31 PM ?

## 2022-04-18 ENCOUNTER — Inpatient Hospital Stay (HOSPITAL_COMMUNITY): Payer: Medicare Other

## 2022-04-18 DIAGNOSIS — R5381 Other malaise: Secondary | ICD-10-CM | POA: Diagnosis not present

## 2022-04-18 MED ORDER — ALTEPLASE 2 MG IJ SOLR
2.0000 mg | Freq: Once | INTRAMUSCULAR | Status: AC
  Administered 2022-04-18: 2 mg
  Filled 2022-04-18: qty 2

## 2022-04-18 MED ORDER — ALTEPLASE 2 MG IJ SOLR: 2.0000 mg | Freq: Once | INTRAMUSCULAR | Status: DC

## 2022-04-18 NOTE — Progress Notes (Signed)
Obtained 2D CXR with placement of PICC line in proximal SVC which is in a different location than the 1D CXR obtained earlier today, showing placement in the mid SVC.  Discussed with Brett Canales, RN (IV team) and Dr. Wynn Banker, MD, that since placement of PICC has changed between the two views it would be best to hold antibiotics until PICC RN comes on shift at 7 am.  PICC removal/placement and PICC nurse consultation is during day shift, 7am-7 pm.  Will follow-up with both PICC nurse and pharmacy.  Communicated plan with Rella Larve, RN, patient's bedside nurse. ?

## 2022-04-18 NOTE — Progress Notes (Signed)
Placed patient on home CPAP for the night with oxygen set at 2lpm  

## 2022-04-18 NOTE — Progress Notes (Signed)
Spoke with Tressia Miners NP re PICC.  Appears CXR is stable PICC placement. Blood return intermittent, infusing well.  Per Ray RN, PICC has not been retracted from insertion site. Recommend maintaining current PICC, instilling tpa today with the understanding that the PICC appears to abut the vein.  ABT to be completed May 7.  Pt has had a PIV placed this am and the PICC is okay to be used according to the CXR  PICC is in the SVC. ?

## 2022-04-18 NOTE — Progress Notes (Signed)
Patient refused CPAP for the night  

## 2022-04-18 NOTE — Progress Notes (Signed)
PROGRESS NOTE   Subjective/Complaints: Patient seen today in bed.  She had an eventful night with difficulties with her PICC line infusing and resultant CXR's for placement confirmation.  She also refused her CPAP again.  She is requesting assistance from nurse/CNA for personal care.  Rash under breast appears improving.  She ate 100% of her tray and is not currently on oxygen at 2 L.  ROS: Patient denies shortness of breath abdominal pain chest pain nausea vomiting constipation diarrhea or visual changes.   Objective:   DG Chest 2 View  Result Date: 04/18/2022 CLINICAL DATA:  Check PICC placement EXAM: CHEST - 2 VIEW COMPARISON:  04/18/2022 FINDINGS: Cardiac shadow is prominent but stable. Aortic calcifications are again seen. Right-sided PICC is noted in the proximal superior vena cava and may abut the vein wall given its positioning. Lungs are well aerated with mild vascular congestion and interstitial edema. IMPRESSION: PICC on the right is seen in the proximal superior vena cava but may abut the vein wall given its positioning. Electronically Signed   By: Alcide Clever M.D.   On: 04/18/2022 01:40   DG CHEST PORT 1 VIEW  Result Date: 04/18/2022 CLINICAL DATA:  PICC line occluded EXAM: PORTABLE CHEST 1 VIEW COMPARISON:  04/07/2022 FINDINGS: Cardiomegaly with mild to moderate interstitial edema. No definite pleural effusions. No pneumothorax. Thoracic aortic atherosclerosis. Right arm PICC terminates in the mid SVC. IMPRESSION: Cardiomegaly with mild to moderate interstitial edema. No definite pleural effusions. Right arm PICC terminates in the mid SVC. Electronically Signed   By: Charline Bills M.D.   On: 04/18/2022 00:17   No results for input(s): WBC, HGB, HCT, PLT in the last 72 hours.  No results for input(s): NA, K, CL, CO2, GLUCOSE, BUN, CREATININE, CALCIUM in the last 72 hours.  Intake/Output Summary (Last 24 hours) at  04/18/2022 1639 Last data filed at 04/18/2022 1500 Gross per 24 hour  Intake 797 ml  Output 1300 ml  Net -503 ml        Physical Exam: General: Awake alert appropriate, BMI is 58 possibly up 5 kg??  Constitutional: No distress . Vital signs reviewed. HENT: Conjugate gaze oropharynx moist oxygen by nasal cannula normocephalic.  Atraumatic. Eyes: EOMI. No discharge. Cardiovascular: RRR. No JVD. Respiratory: CTA Bilaterally. Normal effort.  Good air movement W/R/R GI: BS +. Non-distended.  Nontender. Protuberant.  bowel sounds, positive all 4 quadrants.  No rebound and soft. Neurological alert and oriented to person place time situation Musculoskeletal: Comments: Limited ROM bilateral shoulder due to stiffness with crepitus observed left elbow with attempts at ROM.  However no pain with PROM of left shoulder.  1+ tibially and 2+ POE.  Diffuse weakness Skin: General: Skin warm and dry.  Wounds with Tegaderm on bilateral ankles for chronic areas however both appear clean. +1-2 edema to feet and ankles. Psychiatric: Appropriate  Vital Signs Blood pressure (!) 145/56, pulse 90, temperature 97.7 F (36.5 C), temperature source Oral, resp. rate 16, height 5\' 3"  (1.6 m), weight (!) 148.1 kg, SpO2 98 %.    Assessment/Plan: 1. Functional deficits which require 3+ hours per day of interdisciplinary therapy in a comprehensive inpatient rehab setting. Physiatrist  is providing close team supervision and 24 hour management of active medical problems listed below. Physiatrist and rehab team continue to assess barriers to discharge/monitor patient progress toward functional and medical goals  Care Tool:  Bathing    Body parts bathed by patient: Face, Chest   Body parts bathed by helper: Right arm, Left arm, Buttocks, Front perineal area, Abdomen     Bathing assist Assist Level: 2 Helpers (second helper for rolling, lifting legs for pericare)     Upper Body Dressing/Undressing Upper body  dressing   What is the patient wearing?: Pull over shirt    Upper body assist Assist Level: Moderate Assistance - Patient 50 - 74%    Lower Body Dressing/Undressing Lower body dressing      What is the patient wearing?: Incontinence brief     Lower body assist Assist for lower body dressing: 2 Helpers     Toileting Toileting Toileting Activity did not occur Press photographer and hygiene only):  (incontient)  Toileting assist Assist for toileting: 2 Helpers     Transfers Chair/bed transfer  Transfers assist  Chair/bed transfer activity did not occur: Refused  Chair/bed transfer assist level: 2 Helpers     Locomotion Ambulation   Ambulation assist   Ambulation activity did not occur: Safety/medical concerns          Walk 10 feet activity   Assist  Walk 10 feet activity did not occur: Safety/medical concerns        Walk 50 feet activity   Assist Walk 50 feet with 2 turns activity did not occur: Safety/medical concerns         Walk 150 feet activity   Assist Walk 150 feet activity did not occur: Safety/medical concerns         Walk 10 feet on uneven surface  activity   Assist Walk 10 feet on uneven surfaces activity did not occur: Safety/medical concerns         Wheelchair     Assist Is the patient using a wheelchair?: Yes Type of Wheelchair: Manual (Per PT) Wheelchair activity did not occur: Refused         Wheelchair 50 feet with 2 turns activity    Assist    Wheelchair 50 feet with 2 turns activity did not occur: Refused       Wheelchair 150 feet activity     Assist  Wheelchair 150 feet activity did not occur: Refused       Blood pressure (!) 145/56, pulse 90, temperature 97.7 F (36.5 C), temperature source Oral, resp. rate 16, height  (1.6 m), weight (!) 148.1 kg, SpO2 98 %.  Medical Problem List and Plan: 1. Functional deficits secondary to debility-for complicated medical stay-CHF  exacerbation and bacteremia            - Patient may shower             -ELOS/Goals: 14 to 18 days [facility] PT and OT             Set for 4 weeks             -Continue CIR-PT and OT 2.  Antithrombotics: -DVT/anticoagulation: Mechanical: Sequential compression devices,   below-knee bilateral lower extremities            -04/09/2022 Dopplers negative for DVT's 4/22-patient refusing to use SCDs however educated on need to use them at risk for DVT's             -antiplatelet  therapy: N/A due to GIB 3.  Left hip and shoulder pain with myalgias, pain management: Oxycodone as needed for diffuse pain. 04/08/2022-we will schedule tramadol 100 mg every 8 hours not every 6 hours due to renal issues-creatinine 1.31-continue oxycodone for pain as needed-was taking NSAIDs at home which caused a GI bleed. 04/12/2022-continue tramadol scheduled and oxycodone as needed-04/13/2022-will not go home on oxycodone just tramadol -04/17/2022 patient is well controlled under current pain regimen -4/30 No issues of pain overnight. 4. Mood: LCSW to follow for evaluation and support. - Antipsychotic agents: Not applicable 5. Neuropsych: This patient is capable of making decisions on her own behalf. 6. Skin/Wound Care: Pressure relief measures. - Monitor for recurrent peripheral edema with increase in activity. Modify RF as indicated.    -04/08/2022 on low air loss mattress: Continue that along with 104M Tegaderm for ankles bilaterally per WOC    -04/12/2022 understands that she will have to remain on direct mattress     -Request that pressure is High    -04/15/22 switch to Kreg bed.    -04/16/2022-much improved pain with ROHO in chair and Kreg bed for coccyx pain    -04/17/2022 has adjusted to Kreg bed she continues to have 104M Tegaderm for ankles per WOC 7. Fluids/Electrolytes/Nutrition: Routine in and outs with follow-up chemistries   -4/29 Continue to monitor chemistries.     Component Value Date/Time   NA 139 04/12/2022 0435    K 3.8 04/12/2022 0435   CL 98 04/12/2022 0435   CO2 36 (H) 04/12/2022 0435   GLUCOSE 90 04/12/2022 0435   BUN 14 04/12/2022 0435   CREATININE 0.96 04/12/2022 0435   CALCIUM 8.9 04/12/2022 0435   GFRNONAA >60 04/12/2022 0435    8.  Enterococcus fecalis bacteremia: On ampicillin and ceftriaxone with end date of 04/25/22 -04/17/2022 continue ampicillin and ceftriaxone -04/18/22 Due to issues with PICC line overnight, pharmacy had to retime doses of both antibiotics. 9.  ABLA due to UGIB: ON PPI BID.  We will add iron supplement with MiraLAX daily.   -Check anemia panel is question vitamin B12 and iron deficiency.    -4/20-has iron deficiency    -4/21-on IV iron as well as PO    -4/24 Hgb up to 8.2    -Encouraged appropriate diet, with supplements as above.     -4/29 Hgb stable at 9.0, continue to monitor     -4/30 qMonday labs tomorrow    CBC    Component Value Date/Time   WBC 9.0 04/15/2022 1304   RBC 3.36 (L) 04/15/2022 1304   HGB 9.0 (L) 04/15/2022 1304   HCT 29.7 (L) 04/15/2022 1304   PLT 239 04/15/2022 1304   MCV 88.4 04/15/2022 1304   MCH 26.8 04/15/2022 1304   MCHC 30.3 04/15/2022 1304   RDW 17.2 (H) 04/15/2022 1304   LYMPHSABS 1.1 04/08/2022 0603   MONOABS 0.6 04/08/2022 0603   EOSABS 0.4 04/08/2022 0603   BASOSABS 0.1 04/08/2022 0603   10. CAF: Monitor HR TID-Continue metoprolol. 4/21-rate controlled-continue regimen 4/29 HR stable, in the 70's     04/18/2022    1:35 PM 04/18/2022    2:48 AM 04/17/2022    8:08 PM  Vitals with BMI  Systolic  145 143  Diastolic  56 85  Pulse 90 71 72    11. Chronic diastolic CHF/fluid overload: Heart Healthy diet. Strict I/O.   --2,000 cc (IV + PO) FR-will change to 1,000 cc po as getting IV every 4 hours. --Monitor  for orthostatic changes (due to prior history) --Continue Lasix, metoprolol, and spironolactone. -4/27 weight very off-67 kg incorrect, nursing requested to fix. 4/28 weight very inconsistent, may be due to  technique, will see if can get more stable. -4/29 Wt 148.1 kg, continue to monitor daily weights.  Filed Weights   04/15/22 0500 04/16/22 0538 04/17/22 0547  Weight: (!) 149.8 kg (!) 149.8 kg (!) 148.1 kg    12.  Restless restless leg syndrome: Continue with Requip 2 mg twice daily and 3 mg at bedtime. 13.  OSA/OHS: Hypercarbia noted but improved from 43-35. - Encourage BiPAP use as able to tolerate it for 1 to 3 hours throughout the night.   - Check ABG for baseline/BiPAP justification if needed at discharge   - Wean oxygen as tolerated.  Encourage pulmonary hygiene.   - 04/08/2022-patient having husband bring it from    -04/12/22 on 2 L O2 nasal cannula currently    -04/15/2022-home setting and social work notes-we will let our team know    -04/16/22-let patient know as well    -04/17/2022 patient reports doing CPAP overnight however having some issues with claustrophobia and being able to take the mask on and off due to limited range of motion in left arm.  -4/30 Pt refused CPAP overnight. 14.  Morbid obesity: BMI 60.77.  Educate on appropriate diet to promote mobility and health.    -04/08/2022-BMI down slightly to 56.15 but could be due to today change    -04/10/2022 BMI 53-due to the change in bed.     -04/13/2022-BMI 54.21     -04/15/2022-BMI in computer is 26 asking nursing to fax     -04/17/2022-Wt 148.1 kg, continue to monitor daily weights. 15.  Peripheral edema: Greatly improved due to elevation and limited mobility. - Educated on importance of low-salt diet and elevation when seated. Continue protein supplement for likely low protein stores. Continue compression stockings or Ace wrap to prevent worsening of edema. 04/08/2022 edema looks better this morning 2+ edema continue teds -04/17/2022 still has 1+1 to +2 edema continue measures as outlined above.  Patient also encouraged to have high-protein diet. -4/30 +1-2 pedal edema today 16.  Wounds 04/08/2022-on low air loss  mattress-patient said not as big as 1 upstairs-explained to have a different hospital and already on bariatric low-air-loss mattress that we have. -04/17/2022 K reticulocyte bed also seeing wound care consult for care bilateral lower extremity wounds 20M Tegaderm -4/30 continue above treatment 17.  Bilateral lower extremity wounds-venous stasis ulcers-not pressure       -04/09/22-continue 20M Tegaderm-can be left on for 21 to 28 days per wound ostomy care nurse       -04/12/2022 continue air mattress see discussion above       -04/17/2022 continue both air mattress and 20M Tegaderm       -4/30 Continue Tegaderm and air mattress 18.  Urinary and bowel incontinence -04/11/2022 better with female urine normal continue current regimen -4/49/2023 patient states female urinal is working well for her 4/30 Continue to use female urinal 19. PICC line unable to flush or infuse -4/29 Received call from bedside nurse Rella Larve that PICC line was not working. -IV team was consulted and they were also unable to achieve use of line. This line was not placed here at [facility] but at [facility]. -04/18/22 IV team and Radiology recommended to have 1D-CXR to confirm placement.  Since there was still concern about possible "kink" of line in arm, IV-team recommended to  have also 2D CXR.   -2D CXR showed placement in distal SVC vs mid SVC in 1D view.  Due to difference, PICC line RN was consulted to confirm that the placement was correct and line could be used. - Per PICC line RN line may be used for duration of antibiotic time course through 04/25/22.  Since the line is abutting the vein, there may continue to be intermittent episodes of the line being difficult to infuse. -Per PICC RN, since line was not placed at [facility], the line may only be removed but not exchanged. -Pt also has a PIV which may be a good idea to keep to ensure antibiotics can still be administered even if there are PICC line issues persist.    LOS: 11  days A FACE TO FACE EVALUATION WAS PERFORMED  Tressia Miners, FNP 04/18/2022, 4:39 PM

## 2022-04-18 NOTE — Progress Notes (Signed)
Pt ABT unable to administer due to PICC line clogged. Notified on call provider Maurilio Lovely  (NP) and received new order for 2 view chest x-ray for placement. Notified pharmacy and all ABT on hold. ?

## 2022-04-19 DIAGNOSIS — R5381 Other malaise: Secondary | ICD-10-CM | POA: Diagnosis not present

## 2022-04-19 LAB — CBC
HCT: 31.1 % — ABNORMAL LOW (ref 36.0–46.0)
Hemoglobin: 9.1 g/dL — ABNORMAL LOW (ref 12.0–15.0)
MCH: 25.9 pg — ABNORMAL LOW (ref 26.0–34.0)
MCHC: 29.3 g/dL — ABNORMAL LOW (ref 30.0–36.0)
MCV: 88.6 fL (ref 80.0–100.0)
Platelets: 249 10*3/uL (ref 150–400)
RBC: 3.51 MIL/uL — ABNORMAL LOW (ref 3.87–5.11)
RDW: 18 % — ABNORMAL HIGH (ref 11.5–15.5)
WBC: 7.4 10*3/uL (ref 4.0–10.5)
nRBC: 0 % (ref 0.0–0.2)

## 2022-04-19 LAB — IRON AND TIBC
Iron: 43 ug/dL (ref 28–170)
Saturation Ratios: 10 % — ABNORMAL LOW (ref 10.4–31.8)
TIBC: 431 ug/dL (ref 250–450)
UIBC: 388 ug/dL

## 2022-04-19 LAB — BASIC METABOLIC PANEL
Anion gap: 6 (ref 5–15)
BUN: 21 mg/dL (ref 8–23)
CO2: 35 mmol/L — ABNORMAL HIGH (ref 22–32)
Calcium: 9.2 mg/dL (ref 8.9–10.3)
Chloride: 97 mmol/L — ABNORMAL LOW (ref 98–111)
Creatinine, Ser: 0.98 mg/dL (ref 0.44–1.00)
GFR, Estimated: >60 mL/min (ref >60)
Glucose, Bld: 96 mg/dL (ref 70–99)
Potassium: 3.9 mmol/L (ref 3.5–5.1)
Sodium: 138 mmol/L (ref 135–145)

## 2022-04-19 LAB — FERRITIN: Ferritin: 89 ng/mL (ref 11–307)

## 2022-04-19 NOTE — Progress Notes (Signed)
Physical Therapy Session Note ? ?Patient Details  ?Name: Ezzie Plaut ?MRN: [id number] ?Date of Birth: 09/22/1951 ? ?Today's Date: 04/19/2022 ?PT Individual Time: 1000-1055 ?PT Individual Time Calculation (min): 55 min  ? ?Short Term Goals: ?Week 1:  PT Short Term Goal 1 (Week 1): Pt will perform bed mobility wiht max assist of 1. ?PT Short Term Goal 1 - Progress (Week 1): Met ?PT Short Term Goal 2 (Week 1): Pt will tolerate sitting in recliner for >2 hours throughout the day. ?PT Short Term Goal 2 - Progress (Week 1): Met ?PT Short Term Goal 3 (Week 1): Pt will transfer out of bed with max assist for safety. ?PT Short Term Goal 3 - Progress (Week 1): Progressing toward goal ?Week 2:  PT Short Term Goal 1 (Week 2): Pt will perform least restrictive transfer with mod A x 1 consistently ?PT Short Term Goal 2 (Week 2): Pt will initiate gait training ?PT Short Term Goal 3 (Week 2): Pt will initiate w/c mobility ? ?Skilled Therapeutic Interventions/Progress Updates:  ? Received pt semi-reclined in bed reporting intense soreness from upper body exercises over the weekend. Pt agreeable to PT treatment and reported pain in L shoulder increasing to 9/10 with certain activities (weight bearing). Pt very hyperverbal, requiring increased time with all mobility. Pt on 2L O2 via  throughout session with sats 97% at rest and remained >92% throughout session. Session with emphasis on functional mobility/transfers, toileting, generalized strengthening and endurance, and dynamic standing balance/coordination. Pt transferred semi-reclined<>sitting EOB with HOB elevated and use of bedrails with min/mod A of 2 due to availability, but pt able to initiate rolling and moving LEs off bed - reported urge to have BM once she started moving. Sit<>stand<>pivot bed<>bedside commode with bariatric RW and min/mod A +2 for safety - of note pt prefers to grab onto therapist's arm then transition hand to RW. Pt required total A to doff brief and  placed 3in step underneath feet for comfort/support.  Pt required increased time on bedside commode, frequent repositioning, and cues to avoid Valsalva. Pt with large dark BM and required total A for peri-care. Sit<>stand<>pivot bedside commode<>bed with RW and min/mod A +2 and transferred sit<>supine with max A +2. Rolled L/R with mod A +2 to fasten brief and scooted to HOB with +2 assist. Concluded session with pt semi-reclined in bed with all needs within reach, awaiting upcoming PT session.  ? ?Therapy Documentation ?Precautions:  ?Precautions ?Precautions: Fall ?Restrictions ?Weight Bearing Restrictions: No ? ?Therapy/Group: Individual Therapy ?Anna M Johnson ?Anna Johnson PT, DPT  ?04/19/2022, 7:31 AM  ?

## 2022-04-19 NOTE — Progress Notes (Addendum)
Physical Therapy Session Note ? ?Patient Details  ?Name: Stacy Hanson ?MRN: [id number] ?Date of Birth: Jan 27, 1951 ? ?Today's Date: 04/19/2022 ?PT Individual Time: 1100-1200; 1400-1500 ?PT Individual Time Calculation (min): 60 min and 60 min ? ?Short Term Goals: ?Week 2:  PT Short Term Goal 1 (Week 2): Pt will perform least restrictive transfer with mod A x 1 consistently ?PT Short Term Goal 2 (Week 2): Pt will initiate gait training ?PT Short Term Goal 3 (Week 2): Pt will initiate w/c mobility ? ?Skilled Therapeutic Interventions/Progress Updates:  ?  Session 1: ?Pt received seated in bed, reports feeling very fatigued from previous therapy session but agreeable to participate as able. Pt reports soreness in BUE, abdomen, and BLE but especially in her feet and ankles. Pt reports being pre-medicated for pain prior to start of therapy session. Encouraged pt to speak with medical team about managing nerve pain in feet and ankles. Seated in bed to sitting EOB with mod A needed for trunk elevation. Pt exhibits improved ability to maneuver BLE with increased time and encouragement needed. Sit to stand with max A x 1 from EOB to bariatric RW (+2 SBA for safety and pt comfort). Stand pivot transfer bed to w/c with RW and mod A (+2 SBA for management of equipment). Dependent transport via w/c to therapy gym for time and energy conservation. Encouraged pt to perform short distance gait with bari RW this AM, pt requires max encouragement but is agreeable to attempt. Sit to stand with mod A x 1 and min A x 1 from w/c to RW. Once in standing pt reports increase in pain in her feet and ankles due to edema and neuropathy, unable to take steps. Pt returned to sitting in w/c, requests to return to her room and to bed. Sit to stand with max A x 1 and mod A x 1 from w/c to RW at end of session due to fatigue. Stand pivot transfer w/c to bed with RW and mod A. Sit to supine mod A needed for BLE management. Pt requires +2 to reposition  in bed. Pt left seated in bed with needs in reach at end of session. Pt on 2L O2 via [place] throughout session, SpO2 remains at 90% (+) with activity. Pt somewhat limited in her ability to fully participate in therapy activities this session due to fatigue and anxiety. ? ?Session 2: ?Pt received seated in bed, agreeable to PT session. Pt reports pain in her R quad and B ankles (L>R) this PM. Utilized ice packs at end of session for pain management. Seated in bed to sitting EOB with mod A needed for trunk elevation, pt able to manage BLE with increased time. Sit to stand with up to assist x 2 needed during session to bariatric RW. Stand pivot transfers with RW and mod A with assist from a 2nd person needed for management of equipment and lines. Pt agreeable to attempt ambulation again this PM. Ambulation x 5 ft with bari RW and min A for balance, close w/c follow due to impaired endurance. Pt continues to exhibit difficulty with lifting LLE from the floor during transfers and gait and tends to "scoot" this limb. Pt exhibits improved progress with functional mobility with ability to initiate gait training this date. Seated hip/knee extension on Kinetron x 5 min initially at level 30 cm/sec with progress to 20 cm/sec for last 2 min of exercise. Manual w/c propulsion x 10 ft with min A needed for steering due to difficulty  reaching rims with BUE due to body habitus and alignment of wheels. Pt requests to return to bed at end of session. Sit to supine mod A x 2 needed for BLE management and trunk control. Pt requires assist x 2 to reposition in bed. Pt left seated in bed with needs in reach, husband present, ice pack to R quad and L ankle. Pt on 2L O2 via  throughout therapy session, SpO2 remains at 90 % (+) with activity. ? ?Therapy Documentation ?Precautions:  ?Precautions ?Precautions: Fall ?Restrictions ?Weight Bearing Restrictions: No ? ? ? ? ?Therapy/Group: Individual Therapy ? ? ?Peter Congo, PT, DPT,  CSRS ? ?04/19/2022, 12:17 PM  ?

## 2022-04-19 NOTE — Discharge Instructions (Addendum)
Inpatient Rehab Discharge Instructions  Stacy Hanson Discharge date and time:  05/06/22   Activities/Precautions/ Functional Status: Activity: no lifting, driving, or strenuous exercise till cleared by MD Diet: cardiac diet Wound Care: Not needed   Functional status:  ___ No restrictions     ___ Walk up steps independently _X__ 24/7 supervision/assistance   ___ Walk up steps with assistance ___ Intermittent supervision/assistance  ___ Bathe/dress independently ___ Walk with walker     ___ Bathe/dress with assistance ___ Walk Independently    ___ Shower independently ___ Walk with assistance    _X__ Shower with assistance _X__ No alcohol     ___ Return to work/school ________    COMMUNITY REFERRALS UPON DISCHARGE:    Home Health:   PT     OT       RN    SNA                    Agency: [facility]       Phone:  Liaison- Elnita Maxwell #[phone] *Please expect follow-up within 2-3 days to schedule your home visit. If you have not received follow-up, be sure to contact the liaison Elnita Maxwell to discuss in more detail.*   Medical Equipment/Items Ordered: bariatric rolling walker, hospital bed, and 3in1 bedside commode                                                 Agency/Supplier:[facility] [phone]    Special Instructions: Weight yourself today and every day at the same time. Contact your cardiologist if you weight goes up by 2 lbs over night or 4 lbs in 3-4 days, you have increase swelling, difficulty breathing or need to use extra pillows to sleep.      My questions have been answered and I understand these instructions. I will adhere to these goals and the provided educational materials after my discharge from the hospital.  Patient/Caregiver Signature _______________________________ Date __________  Clinician Signature _______________________________________ Date __________  Please bring this form and your medication list with you to all your follow-up  doctor's appointments.

## 2022-04-19 NOTE — Progress Notes (Signed)
?                                                       PROGRESS NOTE ? ? ?Subjective/Complaints: ? ?Pt reports was on CPAP for 6 hours last night- 3 hrs; then another 3 hours.  ?Otherwise, doing well.  ?BM daily with PO meds.  ? ? ?ROS:  ?Pt denies SOB, abd pain, CP, N/V/C/D, and vision changes ? ? ?Objective: ?  ?DG Chest 2 View ? ?Result Date: 04/18/2022 ?CLINICAL DATA:  Check PICC placement EXAM: CHEST - 2 VIEW COMPARISON:  04/18/2022 FINDINGS: Cardiac shadow is prominent but stable. Aortic calcifications are again seen. Right-sided PICC is noted in the proximal superior vena cava and may abut the vein wall given its positioning. Lungs are well aerated with mild vascular congestion and interstitial edema. IMPRESSION: PICC on the right is seen in the proximal superior vena cava but may abut the vein wall given its positioning. Electronically Signed   By: Inez Catalina M.D.   On: 04/18/2022 01:40  ? ?DG CHEST PORT 1 VIEW ? ?Result Date: 04/18/2022 ?CLINICAL DATA:  PICC line occluded EXAM: PORTABLE CHEST 1 VIEW COMPARISON:  04/07/2022 FINDINGS: Cardiomegaly with mild to moderate interstitial edema. No definite pleural effusions. No pneumothorax. Thoracic aortic atherosclerosis. Right arm PICC terminates in the mid SVC. IMPRESSION: Cardiomegaly with mild to moderate interstitial edema. No definite pleural effusions. Right arm PICC terminates in the mid SVC. Electronically Signed   By: Julian Hy M.D.   On: 04/18/2022 00:17   ?Recent Labs  ?  04/19/22 ?0426  ?WBC 7.4  ?HGB 9.1*  ?HCT 31.1*  ?PLT 249  ? ? ?Recent Labs  ?  04/19/22 ?0426  ?NA 138  ?K 3.9  ?CL 97*  ?CO2 35*  ?GLUCOSE 96  ?BUN 21  ?CREATININE 0.98  ?CALCIUM 9.2  ? ? ?Intake/Output Summary (Last 24 hours) at 04/19/2022 1150 ?Last data filed at 04/19/2022 0130 ?Gross per 24 hour  ?Intake 557 ml  ?Output 1150 ml  ?Net -593 ml  ?  ? ?  ? ?Physical Exam: ? ? ?General: awake, alert, appropriate, on O2 by [place], finished 100% tray; NAD ?HENT: conjugate gaze;  oropharynx moist ?CV: regular rate; no JVD ?Pulmonary: CTA B/L; no W/R/R- good air movement ?GI: soft, NT, ND, (+)BS ?Psychiatric: appropriate- brighter ?Neurological: Ox3 ?Musculoskeletal:in Kreg bed- bariatric ?Comments: Limited ROM bilateral shoulder due to stiffness with crepitus observed left elbow with attempts at ROM.  However no pain with PROM of left shoulder.  1+ tibially and 2+ POE.  Diffuse weakness ?Skin: ?General: Skin warm and dry.  Wounds with Tegaderm on bilateral ankles for chronic areas however both appear clean. +1-2 edema to feet and ankles. ?Psychiatric: Appropriate ? ?Vital Signs ?Blood pressure (!) 146/68, pulse 78, temperature 97.6 ?F (36.4 ?C), temperature source Oral, resp. rate 16, height 5\' 3"  (1.6 m), weight (!) 146.2 kg, SpO2 99 %. ? ? ? ?Assessment/Plan: ?1. Functional deficits which require 3+ hours per day of interdisciplinary therapy in a comprehensive inpatient rehab setting. ?Physiatrist is providing close team supervision and 24 hour management of active medical problems listed below. ?Physiatrist and rehab team continue to assess barriers to discharge/monitor patient progress toward functional and medical goals ? ?Care Tool: ? ?Bathing ?   ?Body parts bathed by patient: Face, Chest  ?  Body parts bathed by helper: Right arm, Left arm, Buttocks, Front perineal area, Abdomen ?  ?  ?Bathing assist Assist Level: 2 Helpers (second helper for rolling, lifting legs for pericare) ?  ?  ?Upper Body Dressing/Undressing ?Upper body dressing   ?What is the patient wearing?: Pull over shirt ?   ?Upper body assist Assist Level: Moderate Assistance - Patient 50 - 74% ?   ?Lower Body Dressing/Undressing ?Lower body dressing ? ? ?   ?What is the patient wearing?: Incontinence brief ? ?  ? ?Lower body assist Assist for lower body dressing: 2 Helpers ?   ? ?Toileting ?Toileting Toileting Activity did not occur Landscape architect and hygiene only):  (incontient)  ?Toileting assist Assist for  toileting: 2 Helpers ?  ?  ?Transfers ?Chair/bed transfer ? ?Transfers assist ? Chair/bed transfer activity did not occur: Refused ? ?Chair/bed transfer assist level: 2 Helpers ?  ?  ?Locomotion ?Ambulation ? ? ?Ambulation assist ? ? Ambulation activity did not occur: Safety/medical concerns ? ?  ?  ?   ? ?Walk 10 feet activity ? ? ?Assist ? Walk 10 feet activity did not occur: Safety/medical concerns ? ?  ?   ? ?Walk 50 feet activity ? ? ?Assist Walk 50 feet with 2 turns activity did not occur: Safety/medical concerns ? ?  ?   ? ? ?Walk 150 feet activity ? ? ?Assist Walk 150 feet activity did not occur: Safety/medical concerns ? ?  ?  ?  ? ?Walk 10 feet on uneven surface  ?activity ? ? ?Assist Walk 10 feet on uneven surfaces activity did not occur: Safety/medical concerns ? ? ?  ?   ? ?Wheelchair ? ? ? ? ?Assist Is the patient using a wheelchair?: Yes ?Type of Wheelchair: Manual (Per PT) ?Wheelchair activity did not occur: Refused ? ?  ?   ? ? ?Wheelchair 50 feet with 2 turns activity ? ? ? ?Assist ? ?  ?Wheelchair 50 feet with 2 turns activity did not occur: Refused ? ? ?   ? ?Wheelchair 150 feet activity  ? ? ? ?Assist ? Wheelchair 150 feet activity did not occur: Refused ? ? ?   ? ?Blood pressure (!) 146/68, pulse 78, temperature 97.6 ?F (36.4 ?C), temperature source Oral, resp. rate 16, height 5\' 3"  (1.6 m), weight (!) 146.2 kg, SpO2 99 %. ? ?Medical Problem List and Plan: ?1. Functional deficits secondary to debility-for complicated medical stay-CHF exacerbation and bacteremia ?           - Patient may shower ?            -ELOS/Goals: 14 to 18 days [facility] PT and OT ?            Set for 4 weeks ?            -Continue CIR- PT, OT  ?2.  Antithrombotics: ?-DVT/anticoagulation: Mechanical: Sequential compression devices,   below-knee bilateral lower extremities ?           -04/09/2022 Dopplers negative for DVT's ?4/22-patient refusing to use SCDs however educated on need to use them at risk for DVT's ?             -antiplatelet therapy: N/A due to GIB ?3.  Left hip and shoulder pain with myalgias, pain management: Oxycodone as needed for diffuse pain. ?04/08/2022-we will schedule tramadol 100 mg every 8 hours not every 6 hours due to renal issues-creatinine 1.31-continue oxycodone for pain as needed-was taking NSAIDs at  home which caused a GI bleed. ?04/12/2022-continue tramadol scheduled and oxycodone as needed-04/13/2022-will not go home on oxycodone just tramadol ?- 5/1- pt pain controlled- will be able to go home on Tramadol- not Oxy- she's having more pain due to exercise it appears,  ?4. Mood: LCSW to follow for evaluation and support. ?- Antipsychotic agents: Not applicable ?5. Neuropsych: This patient is capable of making decisions on her own behalf. ?6. Skin/Wound Care: Pressure relief measures. ?- Monitor for recurrent peripheral edema with increase in activity. ?Modify RF as indicated. ?   -04/08/2022 on low air loss mattress: Continue that along with 31M Tegaderm for ankles bilaterally per WOC ?   -04/12/2022 understands that she will have to remain on direct mattress ?    -Request that pressure is High ?   -04/15/22 switch to Kreg bed. ?   -04/16/2022-much improved pain with ROHO in chair and Kreg bed for coccyx pain ?   -04/17/2022 has adjusted to Kreg bed she continues to have 31M Tegaderm for ankles per WOC ? 5/1- tolerating Kreg bed ?7. Fluids/Electrolytes/Nutrition: Routine in and outs with follow-up chemistries ?  -4/29 Continue to monitor chemistries. ? 5/1- CO2 up to 35- will need to likely wean O2 as tolerated ?   ?Component Value Date/Time  ? NA 138 04/19/2022 0426  ? K 3.9 04/19/2022 0426  ? CL 97 (L) 04/19/2022 0426  ? CO2 35 (H) 04/19/2022 0426  ? GLUCOSE 96 04/19/2022 0426  ? BUN 21 04/19/2022 0426  ? CREATININE 0.98 04/19/2022 0426  ? CALCIUM 9.2 04/19/2022 0426  ? GFRNONAA >60 04/19/2022 0426  ?  ?8.  Enterococcus fecalis bacteremia: On ampicillin and ceftriaxone with end date of 04/25/22 ?-04/17/2022  continue ampicillin and ceftriaxone ?-04/18/22 Due to issues with PICC line overnight, pharmacy had to retime doses of both antibiotics. ?9.  ABLA due to UGIB: ON PPI BID.  We will add iron supplement with MiraLAX daily.

## 2022-04-20 DIAGNOSIS — R5381 Other malaise: Secondary | ICD-10-CM | POA: Diagnosis not present

## 2022-04-20 MED ORDER — DULOXETINE HCL 30 MG PO CPEP
30.0000 mg | ORAL_CAPSULE | Freq: Every day | ORAL | Status: DC
  Administered 2022-04-20 – 2022-04-22 (×3): 30 mg via ORAL
  Filled 2022-04-20 (×4): qty 1

## 2022-04-20 NOTE — Progress Notes (Signed)
Orthopedic Tech Progress Note ?Patient Details:  ?Stacy Hanson ?1951-08-19 ?[id number] ? ?Tried reaching out to RN to let them know [facility] provides [facility]. [facility] only has [facility]. May have to reach out to materials  ? ?  Patient ID: Stacy Hanson, female   DOB: December 08, 1951, 71 y.o.   MRN: [id number] ? ?Janit Pagan ?04/20/2022, 11:55 AM ? ?

## 2022-04-20 NOTE — Progress Notes (Signed)
?                                                       PROGRESS NOTE ? ? ?Subjective/Complaints: ? ?Pt reports fought CPAP last night, so didn't sleep as well.  ?Had swelling with gabapentin, but having horrible nerve pain in feet/ankles with N/T/burning with standing/walking.  ?Wondering if there's any other nerve pain meds can use.  ? ?Also concerned about weight gain- since not doing a lot- we discussed cutting her carbs/desserts, etc which can hlep- also went over eat sweets with protein- like peanut M&M's for example- reduces craving more.  ? ?ROS:  ? ?Pt denies SOB, abd pain, CP, N/V/C/D, and vision changes ? ?Objective: ?  ?No results found. ?Recent Labs  ?  04/19/22 ?0426  ?WBC 7.4  ?HGB 9.1*  ?HCT 31.1*  ?PLT 249  ? ? ?Recent Labs  ?  04/19/22 ?0426  ?NA 138  ?K 3.9  ?CL 97*  ?CO2 35*  ?GLUCOSE 96  ?BUN 21  ?CREATININE 0.98  ?CALCIUM 9.2  ? ? ?Intake/Output Summary (Last 24 hours) at 04/20/2022 1038 ?Last data filed at 04/20/2022 0700 ?Gross per 24 hour  ?Intake 457 ml  ?Output 900 ml  ?Net -443 ml  ?  ? ?  ? ?Physical Exam: ? ? ? ?General: awake, alert, appropriate, in Kreg bed- finished 100% tray; looks more tired; NAD ?HENT: conjugate gaze; oropharynx moist ?CV: regular rate; no JVD ?Pulmonary: CTA B/L; no W/R/R- good air movement- O2 by [place]- I place- 2L ?GI: soft, NT, ND, (+)BS- BMI 56/weight 145.2 kg ?Psychiatric: appropriate- appears more tired/slightly anxious today ?Neurological: Ox3 ?Musculoskeletal:in Kreg bed- bariatric ?Comments: Limited ROM bilateral shoulder due to stiffness with crepitus observed left elbow with attempts at ROM.  However no pain with PROM of left shoulder.  1+ tibially and 2+ POE.  Diffuse weakness ?Skin: ?General: Skin warm and dry.  Wounds with Tegaderm on bilateral ankles for chronic areas however both appear clean. +1 LE edema B/L - looks better ? ? ?Vital Signs ?Blood pressure (!) 158/62, pulse 76, temperature 97.8 ?F (36.6 ?C), temperature source Oral, resp. rate 18,  height 5\' 3"  (1.6 m), weight (!) 145.2 kg, SpO2 99 %. ? ? ? ?Assessment/Plan: ?1. Functional deficits which require 3+ hours per day of interdisciplinary therapy in a comprehensive inpatient rehab setting. ?Physiatrist is providing close team supervision and 24 hour management of active medical problems listed below. ?Physiatrist and rehab team continue to assess barriers to discharge/monitor patient progress toward functional and medical goals ? ?Care Tool: ? ?Bathing ?   ?Body parts bathed by patient: Face, Chest  ? Body parts bathed by helper: Right arm, Left arm, Buttocks, Front perineal area, Abdomen ?  ?  ?Bathing assist Assist Level: 2 Helpers (second helper for rolling, lifting legs for pericare) ?  ?  ?Upper Body Dressing/Undressing ?Upper body dressing   ?What is the patient wearing?: Pull over shirt ?   ?Upper body assist Assist Level: Moderate Assistance - Patient 50 - 74% ?   ?Lower Body Dressing/Undressing ?Lower body dressing ? ? ?   ?What is the patient wearing?: Incontinence brief ? ?  ? ?Lower body assist Assist for lower body dressing: 2 Helpers ?   ? ?Toileting ?Toileting Toileting Activity did not occur Landscape architect and hygiene  only):  (incontient)  ?Toileting assist Assist for toileting: 2 Helpers ?  ?  ?Transfers ?Chair/bed transfer ? ?Transfers assist ? Chair/bed transfer activity did not occur: Refused ? ?Chair/bed transfer assist level: 2 Helpers ?  ?  ?Locomotion ?Ambulation ? ? ?Ambulation assist ? ? Ambulation activity did not occur: Safety/medical concerns ? ?Assist level: 2 helpers ?Assistive device: Walker-rolling ?Max distance: 5'  ? ?Walk 10 feet activity ? ? ?Assist ? Walk 10 feet activity did not occur: Safety/medical concerns ? ?  ?   ? ?Walk 50 feet activity ? ? ?Assist Walk 50 feet with 2 turns activity did not occur: Safety/medical concerns ? ?  ?   ? ? ?Walk 150 feet activity ? ? ?Assist Walk 150 feet activity did not occur: Safety/medical concerns ? ?  ?  ?  ? ?Walk  10 feet on uneven surface  ?activity ? ? ?Assist Walk 10 feet on uneven surfaces activity did not occur: Safety/medical concerns ? ? ?  ?   ? ?Wheelchair ? ? ? ? ?Assist Is the patient using a wheelchair?: Yes ?Type of Wheelchair: Manual ?Wheelchair activity did not occur: Refused ? ?Wheelchair assist level: Minimal Assistance - Patient > 75% ?Max wheelchair distance: 10'  ? ? ?Wheelchair 50 feet with 2 turns activity ? ? ? ?Assist ? ?  ?Wheelchair 50 feet with 2 turns activity did not occur: Refused ? ? ?   ? ?Wheelchair 150 feet activity  ? ? ? ?Assist ? Wheelchair 150 feet activity did not occur: Refused ? ? ?   ? ?Blood pressure (!) 158/62, pulse 76, temperature 97.8 ?F (36.6 ?C), temperature source Oral, resp. rate 18, height 5\' 3"  (1.6 m), weight (!) 145.2 kg, SpO2 99 %. ? ?Medical Problem List and Plan: ?1. Functional deficits secondary to debility-for complicated medical stay-CHF exacerbation and bacteremia ?           - Patient may shower ?            -ELOS/Goals: 14 to 18 days [facility] PT and OT ?            Set for 4 weeks ?            Continue CIR- PT, OT  ? Team conference to f/u on progress today ?2.  Antithrombotics: ?-DVT/anticoagulation: Mechanical: Sequential compression devices,   below-knee bilateral lower extremities ?           -04/09/2022 Dopplers negative for DVT's ?4/22-patient refusing to use SCDs however educated on need to use them at risk for DVT's ?            -antiplatelet therapy: N/A due to GIB ?3.  Left hip and shoulder pain with myalgias, pain management: Oxycodone as needed for diffuse pain. ?04/08/2022-we will schedule tramadol 100 mg every 8 hours not every 6 hours due to renal issues-creatinine 1.31-continue oxycodone for pain as needed-was taking NSAIDs at home which caused a GI bleed. ?04/12/2022-continue tramadol scheduled and oxycodone as needed-04/13/2022-will not go home on oxycodone just tramadol ?- 5/1- pt pain controlled- will be able to go home on Tramadol- not Oxy- she's  having more pain due to exercise it appears,  ? 5/2- adding Duloxetine 30 mg nightly for nerve pain- explained will take a few days to kick in - will increase next week, or can be increased as of 04/23/22 at the earliest.  ?4. Mood: LCSW to follow for evaluation and support. ?- Antipsychotic agents: Not applicable ?5. Neuropsych: This patient  is capable of making decisions on her own behalf. ?6. Skin/Wound Care: Pressure relief measures. ?- Monitor for recurrent peripheral edema with increase in activity. ?Modify RF as indicated. ?   -04/08/2022 on low air loss mattress: Continue that along with 46M Tegaderm for ankles bilaterally per WOC ?   -04/12/2022 understands that she will have to remain on direct mattress ?    -Request that pressure is High ?   -04/15/22 switch to Kreg bed. ?   -04/16/2022-much improved pain with ROHO in chair and Kreg bed for coccyx pain ?   -04/17/2022 has adjusted to Kreg bed she continues to have 46M Tegaderm for ankles per WOC ? 5/1- tolerating Kreg bed ?7. Fluids/Electrolytes/Nutrition: Routine in and outs with follow-up chemistries ?  -4/29 Continue to monitor chemistries. ? 5/1- CO2 up to 35- will need to likely wean O2 as tolerated ?   ?Component Value Date/Time  ? NA 138 04/19/2022 0426  ? K 3.9 04/19/2022 0426  ? CL 97 (L) 04/19/2022 0426  ? CO2 35 (H) 04/19/2022 0426  ? GLUCOSE 96 04/19/2022 0426  ? BUN 21 04/19/2022 0426  ? CREATININE 0.98 04/19/2022 0426  ? CALCIUM 9.2 04/19/2022 0426  ? GFRNONAA >60 04/19/2022 0426  ?  ?8.  Enterococcus fecalis bacteremia: On ampicillin and ceftriaxone with end date of 04/25/22 ?-04/17/2022 continue ampicillin and ceftriaxone ?-04/18/22 Due to issues with PICC line overnight, pharmacy had to retime doses of both antibiotics. ?9.  ABLA due to UGIB: ON PPI BID.  We will add iron supplement with MiraLAX daily. ?  -Check anemia panel is question vitamin B12 and iron deficiency. ?   -4/20-has iron deficiency ?   -4/21-on IV iron as well as PO ?   -4/24 Hgb up  to 8.2 ?   -Encouraged appropriate diet, with supplements as above. ?     5/1- Hb 9.1- stable ? CBC ?   ?Component Value Date/Time  ? WBC 7.4 04/19/2022 0426  ? RBC 3.51 (L) 04/19/2022 0426  ? HGB 9.1 (L) 05/

## 2022-04-20 NOTE — Plan of Care (Signed)
?  Problem: RH BOWEL ELIMINATION ?Goal: RH STG MANAGE BOWEL WITH ASSISTANCE ?Description: STG Manage Bowel with Mod Assistance. ?Outcome: Not Progressing; incontinence at times ?  ?Problem: RH BLADDER ELIMINATION ?Goal: RH STG MANAGE BLADDER WITH ASSISTANCE ?Description: STG Manage Bladder With Mod Assistance ?Outcome: Not Progressing; incontinence at times ?  ?

## 2022-04-20 NOTE — Progress Notes (Signed)
Physical Therapy Session Note ? ?Patient Details  ?Name: Stacy Hanson ?MRN: [id number] ?Date of Birth: Apr 29, 1951 ? ?Today's Date: 04/20/2022 ?PT Individual Time: [id number]; [id number] ?PT Individual Time Calculation (min): 30 min and 65 min ?PT Missed Time: 10 min ?Missed Time Reason: patient fatigue ? ?Short Term Goals: ?Week 2:  PT Short Term Goal 1 (Week 2): Pt will perform least restrictive transfer with mod A x 1 consistently ?PT Short Term Goal 2 (Week 2): Pt will initiate gait training ?PT Short Term Goal 3 (Week 2): Pt will initiate w/c mobility ? ?Skilled Therapeutic Interventions/Progress Updates:  ?  Session 1: ?Pt received seated in bed, agreeable to PT session. Pt reports pain in her L forearm where her IV was just removed. Gauze noted to be soaked through with blood. Assisted pt with redressing site with new gauze. Pt agreeable to stand up to scale for more accurate weight reading this date. Seated in bed to sitting EOB with min A needed for some trunk elevation. Sit to stand x 2 reps to scale with min A. Pt weighs 304.8 lbs, nursing notified. Pt returned to supine with assist x 2 needed for LE management and trunk control. Pt left seated in bed with needs in reach at end of session. ? ?Session 2: ?Pt received seated in bed, agreeable to PT session. Pt reports burning pain in B feet and ankles, will receive nerve pain medication this evening. Pt did receive pain medication prior to start of therapy session. Utilized rest breaks as needed for pain management. Seated in bed to sitting EOB with min A for some trunk control, increased time needed for BLE management. Sit to stand with min to mod A to RW during session, stand pivot transfers with RW and min A. Dependent transport via w/c to/from therapy gym for time and energy conservation. Ambulation x 10 ft with RW and min A for balance with close w/c follow for safety. Pt exhibits improved ability to clear LLE during gait, continues to exhibit flexed  trunk posture. Pt encouraged by increased gait distance this date. Seated hip/knee extension on Kinetron level 30 cm/sec progressing to 20 cm/sec, 3 x 2 min sets with 50 steps per set for BLE strengthening and reciprocal movement training. Pt requires cues to maintain upright trunk posture during use of Kinetron. Pt requests to return to bed at end of session. Sit to supine assist x 2 needed for BLE management and trunk control. Pt left seated in bed with needs in reach, husband present. Pt initially on 2L O2 at rest with SpO2 98%, decreased to RA. While on RA SpO2 at 97%. With activity SpO2 drops to 86% on RA. Increased pt to 1L O2 via [place]. Pt able to maintain SpO2 at 90% and higher while at 1L O2 with activity this session. ? ?Therapy Documentation ?Precautions:  ?Precautions ?Precautions: Fall ?Restrictions ?Weight Bearing Restrictions: No ? ? ? ? ? ?Therapy/Group: Individual Therapy ? ? ?[name], PT, DPT, CSRS ? ?04/20/2022, 12:05 PM  ?

## 2022-04-20 NOTE — Progress Notes (Signed)
Occupational Therapy Session Note ? ?Patient Details  ?Name: Stacy Hanson ?MRN: [id number] ?Date of Birth: May 25, 1951 ? ?Today's Date: 04/20/2022 ?OT Individual Time: 1000-1056 ?OT Individual Time Calculation (min): 56 min  ? ? ?Short Term Goals: ?Week 2:  OT Short Term Goal 1 (Week 2): Pt will be able to get to EOB with max A +1 ?OT Short Term Goal 2 (Week 2): Pt will perform stand pivot transfer to [facility] with RW with mod A ?OT Short Term Goal 3 (Week 2): Pt will don shirt sitting EOB with mod A ?OT Short Term Goal 4 (Week 2): Pt will perform 2 grooming tasks with setup (ie wash face, brush teeth) ? ?Skilled Therapeutic Interventions/Progress Updates:  ?  Pt resting in bed upon arrival. XXL compression hose had been delivered to room. OTA attempted to don hose on pt's RLE but unable to successfully don 2/2 increased pain. Session focus on LUE PROM, AAROM, and AROM during functional reaching tasks to simulate self care tasks. Focus on form and improved ROM. Pt requires gravity eliminated for all LUE movements above 45* with simulated face washing.  ? ?LUE AAROM: ?Shoulder flexion 3x5 to 90* ?Elbow flexion 3x5 ?Horizontal adduction 3x5 ? ?LUE PROM: ?Shoulder flexion 3/5 to 110* ? ?Pt remained in bed with all needs within reach.  ? ?Therapy Documentation ?Precautions:  ?Precautions ?Precautions: Fall ?Restrictions ?Weight Bearing Restrictions: No ? ?Pain: ? Pt denies pain at rest but reports increased pain in RLE when donning compression hose. Pt also reports LUE (lateral deltoid area) with AROM past 45*; PROM and removed compression hose ? ? ?Therapy/Group: Individual Therapy ? ?Rich Brave ?04/20/2022, 12:22 PM ?

## 2022-04-20 NOTE — Progress Notes (Signed)
Patient ID: Stacy Hanson, female   DOB: [birth year], 71 y.o.   MRN: [id number] ? ?SW met with pt in room while in therapy to provide updates from team conference, and d/c date remains 5/18. SW discussed family edu. Currently discussed scheduling for Wed (5/10) 1pm until therapy completed.  ? ?[id number]- Pt husband on his way here. SW will come by and meet in room later to discuss above.  ? ?*SW met with pt and pt husband in room to discuss above. Confirms family edu.  ? ?[name], MSW, LCSWA ?Office: [phone] ?Cell: [phone] ?Fax: [phone]  ?

## 2022-04-20 NOTE — Patient Care Conference (Signed)
Inpatient RehabilitationTeam Conference and Plan of Care Update ?Date: 04/20/2022   Time: 11:13 AM  ? ? ?Patient Name: Micheal Sheen      ?Medical Record Number: [id number]  ?Date of Birth: April 13, 1951 ?Sex: Female         ?Room/Bed: 8G66Z/9D35T-01 ?Payor Info: Payor: MEDICARE / Plan: MEDICARE PART A AND B / Product Type: *No Product type* /   ? ?Admit Date/Time:  04/07/2022  2:08 PM ? ?Primary Diagnosis:  Debility ? ?Hospital Problems: Principal Problem: ?  Debility ? ? ? ?Expected Discharge Date: Expected Discharge Date: 05/06/22 ? ?Team Members Present: ?Physician leading conference: Dr. Genice Rouge ?Social Worker Present: Cecile Sheerer, LCSWA ?Nurse Present: Kennyth Arnold, RN ?PT Present: Peter Congo, PT ?OT Present: Ardis Rowan, Jaynee Eagles, OT ?PPS Coordinator present : Fae Pippin, SLP ? ?   Current Status/Progress Goal Weekly Team Focus  ?Bowel/Bladder ? ? Has incontinent and continent episodes. LBM 5/1, using female urinal  Reduce incontinent episodes.  Offer toileting assist as need.   ?Swallow/Nutrition/ Hydration ? ?           ?ADL's ? ? bathing-mod A; dressing-max A; tranfsers with RW-min/mod A; toileting-dependent  transfers-min A; UB baitng/dressing-min A; LB dressing-mod A; toileting-max A  bed moblity, sitting balance, standing balance, funcitonal transfers   ?Mobility ? ? min A rolling, mod A to +2 supine to/from sit, ranges from min A to +2 to stand depending on fatigue, min to mod A SPT RW, gait x 5 ft min A with w/c follow  mod A overall, short distance gait (10 ft), w/c mobility 50 ft  transfers with RW, gait, endurance, LE strengthening   ?Communication ? ?           ?Safety/Cognition/ Behavioral Observations ?           ?Pain ? ? Pain to BLE. Scheduled Tramadol, prn oxy  pain <3/10  Assess Qshift and prn   ?Skin ? ? Blisters to BLE. CLear dressings intact.  no new breakdown  Assess Qshift and prn   ? ? ?Discharge Planning:  ?D/c to home with pt husband. Pt husband woud like  for pt to be as independent as possible. Pt home BiPAP machine has been set up by [employer].   ?Team Discussion: ?Beginning to use CPAP. Still doesn't like the bed. We can't change PICC line due to insertion at different location. IV team managing dressing changes. Patient concerned about weight gain. Add Duloxetine for nerve pain, and can increase later this week if patient tolerates it well. Daily weights still inconsistent, therapy will use standing scale during session. Edema in legs has approved. Will place order for [employer] to evaluate for Ted hose. Incontinent/continent B/B. Chronic pain, scheduled Tramadol and PRN Oxy IR available. Leg wounds addressed by WOC, nursing to follow dressing change orders. Discharging home with spouse who is here daily and very helpful. ? ?Patient on target to meet rehab goals: ?yes, min/mod assist goals. Making good progress. Able to stand with walker. Min/mod assist standing. EOB bathing mod assist. ? ?*See Care Plan and progress notes for long and short-term goals.  ? ?Revisions to Treatment Plan:  ?Adjusting medications, monitoring labs. ?  ?Teaching Needs: ?Family education, medication/pain management, skin/wound care, bowel/bladder management, transfer/gait training, etc. ?  ?Current Barriers to Discharge: ?Decreased caregiver support, Home enviroment access/layout, Incontinence, Wound care, Lack of/limited family support, Weight, and pain and anxiety. ? ?Possible Resolutions to Barriers: ?Family education ?Follow-up therapy ?Schedule timed voiding ? ?  ? ?  Medical Summary ?Current Status: morbid obesity- debility- trying to get bigger compression TEDs-cannot always be continent- can't get there in time-venous stasis ulcers- dressing change per WOC- ? Barriers to Discharge: Behavior;Decreased family/caregiver support;Home enviroment access/layout;Medical stability;IV antibiotics;Weight;Wound care;Medication compliance;Incontinence ? Barriers to Discharge Comments: going home  with husband- ?Possible Resolutions to [employer]: barriers- her weight, pain; and anxiety/ -- mod of 2- Max A of 2- for safety/anxiety- walked 5 ft RW- D for toileting; ADLs bed level- d/c 5/18 ? ? ?Continued Need for Acute Rehabilitation Level of Care: The patient requires daily medical management by a physician with specialized training in physical medicine and rehabilitation for the following reasons: ?Direction of a multidisciplinary physical rehabilitation program to maximize functional independence : Yes ?Medical management of patient stability for increased activity during participation in an intensive rehabilitation regime.: Yes ?Analysis of laboratory values and/or radiology reports with any subsequent need for medication adjustment and/or medical intervention. : Yes ? ? ?I attest that I was present, lead the team conference, and concur with the assessment and plan of the team. ? ? ?Kennyth Arnold G ?04/20/2022, 3:21 PM  ? ? ? ? ? ? ?

## 2022-04-20 NOTE — Progress Notes (Signed)
Pt states she will self admin CPAP for bed time tonight. RT will cont to monitor as needed. ?

## 2022-04-20 NOTE — Progress Notes (Signed)
Pt. C/o pain and soreness at PICC site. Picc is flushing well with good blood return. There is no redness, pain, or drainage at insertion site. Biopatch is dry. There is some bruising on either side of insertion around the dressing. Pt. States this is where soreness is. Advised pt. To let us know if pain becomes worse, however, at this time, PICC site looks appropriate. Notified RN. ?

## 2022-04-20 NOTE — Progress Notes (Signed)
Occupational Therapy Session Note ? ?Patient Details  ?Name: Stacy Hanson ?MRN: [id number] ?Date of Birth: 1951/06/06 ? ?Today's Date: 04/20/2022 ?OT Individual Time: 4982-6415 ?OT Individual Time Calculation (min): 55 min  ? ? ?Short Term Goals: ?Week 2:  OT Short Term Goal 1 (Week 2): Pt will be able to get to EOB with max A +1 ?OT Short Term Goal 2 (Week 2): Pt will perform stand pivot transfer to [facility] with RW with mod A ?OT Short Term Goal 3 (Week 2): Pt will don shirt sitting EOB with mod A ?OT Short Term Goal 4 (Week 2): Pt will perform 2 grooming tasks with setup (ie wash face, brush teeth) ? ?Skilled Therapeutic Interventions/Progress Updates:  ?  Pt resting in bed upon arrival. OT intervention with focus on bed mobility, LUE PROM/AAROM/AROM, and discharge planning. Requested pt have personal BLE compression wraps brought from home. Pt in agreement. Pt assisted with repostioning in bed (max A+2). Pt independently operates bed. Pt required max A+2 to reposition in bed laterally.  ? ?LUE AAROM/AROM ?Shoulder flexion and adduction 3x5 ? ?LUE PROM ?Shoulder flexion to 110* ? ?Pt able to touch chin and nose with HOB elevated to 45* and no assist from OTA but requires assistance to reach up to nose and eyes. ? ?Pt remained in bed with all needs within reach. ? ? ?Therapy Documentation ?Precautions:  ?Precautions ?Precautions: Fall ?Restrictions ?Weight Bearing Restrictions: No ? ?Pain: ?Pt c/o RUE (mid humerus) with functional movements/reaching; repositioning and PROM ? ? ?Therapy/Group: Individual Therapy ? ?Rich Brave ?04/20/2022, 1:56 PM ?

## 2022-04-21 DIAGNOSIS — R5381 Other malaise: Secondary | ICD-10-CM | POA: Diagnosis not present

## 2022-04-21 NOTE — Progress Notes (Signed)
?                                                       PROGRESS NOTE ? ? ?Subjective/Complaints: ? ?Trying to find more protein/less carbs for meals.  ?Good night- got in great position-  also used CPAP all but 30 minutes overnight- went better.  ?No side effects with Duloxetine.  ? ?ROS:  ? ?Pt denies SOB, abd pain, CP, N/V/C/D, and vision changes ? ?Objective: ?  ?No results found. ?Recent Labs  ?  04/19/22 ?0426  ?WBC 7.4  ?HGB 9.1*  ?HCT 31.1*  ?PLT 249  ? ? ?Recent Labs  ?  04/19/22 ?0426  ?NA 138  ?K 3.9  ?CL 97*  ?CO2 35*  ?GLUCOSE 96  ?BUN 21  ?CREATININE 0.98  ?CALCIUM 9.2  ? ? ?Intake/Output Summary (Last 24 hours) at 04/21/2022 [id number] ?Last data filed at 04/21/2022 0710 ?Gross per 24 hour  ?Intake 1000 ml  ?Output 900 ml  ?Net 100 ml  ?  ? ?  ? ?Physical Exam: ? ? ? ? ?General: awake, alert, appropriate, sitting up in Kreg bed- BMI 56.94; NAD ?HENT: conjugate gaze; oropharynx moist ?CV: regular rate; no JVD ?Pulmonary: CTA B/L; no W/R/R- good air movement- on O2 by [place] 2L ?GI: soft, NT, ND, (+)BS- protuberant ?Psychiatric: appropriate; brighter affect ?Neurological: Ox3 ?Musculoskeletal:in Kreg bed- bariatric ?Comments: Limited ROM bilateral shoulder due to stiffness with crepitus observed left elbow with attempts at ROM.  However no pain with PROM of left shoulder.  1+ tibially and 2+ POE.  Diffuse weakness ?Skin: ?General: Skin warm and dry.  Wounds with Tegaderm on bilateral ankles for chronic areas however both appear clean. +1 LE edema B/L - looks better ? ? ?Vital Signs ?Blood pressure (!) 150/73, pulse 85, temperature 97.9 ?F (36.6 ?C), temperature source Oral, resp. rate 18, height 5\' 3"  (1.6 m), weight (!) 145.8 kg, SpO2 98 %. ? ? ? ?Assessment/Plan: ?1. Functional deficits which require 3+ hours per day of interdisciplinary therapy in a comprehensive inpatient rehab setting. ?Physiatrist is providing close team supervision and 24 hour management of active medical problems listed below. ?Physiatrist  and rehab team continue to assess barriers to discharge/monitor patient progress toward functional and medical goals ? ?Care Tool: ? ?Bathing ?   ?Body parts bathed by patient: Face, Chest  ? Body parts bathed by helper: Right arm, Left arm, Buttocks, Front perineal area, Abdomen ?  ?  ?Bathing assist Assist Level: 2 Helpers (second helper for rolling, lifting legs for pericare) ?  ?  ?Upper Body Dressing/Undressing ?Upper body dressing   ?What is the patient wearing?: Pull over shirt ?   ?Upper body assist Assist Level: Moderate Assistance - Patient 50 - 74% ?   ?Lower Body Dressing/Undressing ?Lower body dressing ? ? ?   ?What is the patient wearing?: Incontinence brief ? ?  ? ?Lower body assist Assist for lower body dressing: 2 Helpers ?   ? ?Toileting ?Toileting Toileting Activity did not occur Landscape architect and hygiene only):  (incontient)  ?Toileting assist Assist for toileting: 2 Helpers ?  ?  ?Transfers ?Chair/bed transfer ? ?Transfers assist ? Chair/bed transfer activity did not occur: Refused ? ?Chair/bed transfer assist level: Minimal Assistance - Patient > 75% ?  ?  ?Locomotion ?Ambulation ? ? ?Ambulation assist ? ?  Ambulation activity did not occur: Safety/medical concerns ? ?Assist level: 2 helpers ?Assistive device: Walker-rolling ?Max distance: 29'  ? ?Walk 10 feet activity ? ? ?Assist ? Walk 10 feet activity did not occur: Safety/medical concerns ? ?Assist level: 2 helpers ?Assistive device: Walker-rolling  ? ?Walk 50 feet activity ? ? ?Assist Walk 50 feet with 2 turns activity did not occur: Safety/medical concerns ? ?  ?   ? ? ?Walk 150 feet activity ? ? ?Assist Walk 150 feet activity did not occur: Safety/medical concerns ? ?  ?  ?  ? ?Walk 10 feet on uneven surface  ?activity ? ? ?Assist Walk 10 feet on uneven surfaces activity did not occur: Safety/medical concerns ? ? ?  ?   ? ?Wheelchair ? ? ? ? ?Assist Is the patient using a wheelchair?: Yes ?Type of Wheelchair: Manual ?Wheelchair  activity did not occur: Refused ? ?Wheelchair assist level: Minimal Assistance - Patient > 75% ?Max wheelchair distance: 10'  ? ? ?Wheelchair 50 feet with 2 turns activity ? ? ? ?Assist ? ?  ?Wheelchair 50 feet with 2 turns activity did not occur: Refused ? ? ?   ? ?Wheelchair 150 feet activity  ? ? ? ?Assist ? Wheelchair 150 feet activity did not occur: Refused ? ? ?   ? ?Blood pressure (!) 150/73, pulse 85, temperature 97.9 ?F (36.6 ?C), temperature source Oral, resp. rate 18, height 5\' 3"  (1.6 m), weight (!) 145.8 kg, SpO2 98 %. ? ?Medical Problem List and Plan: ?1. Functional deficits secondary to debility-for complicated medical stay-CHF exacerbation and bacteremia ?           - Patient may shower ?            -ELOS/Goals: 14 to 18 days MinA PT and OT ?           D/c 5/18 ? Continue CIR- PT, OT - walked 5 ft this week with RW ?2.  Antithrombotics: ?-DVT/anticoagulation: Mechanical: Sequential compression devices,   below-knee bilateral lower extremities ?           -04/09/2022 Dopplers negative for DVT's ?4/22-patient refusing to use SCDs however educated on need to use them at risk for DVT's ?            -antiplatelet therapy: N/A due to GIB ?3.  Left hip and shoulder pain with myalgias, pain management: Oxycodone as needed for diffuse pain. ?04/08/2022-we will schedule tramadol 100 mg every 8 hours not every 6 hours due to renal issues-creatinine 1.31-continue oxycodone for pain as needed-was taking NSAIDs at home which caused a GI bleed. ?04/12/2022-continue tramadol scheduled and oxycodone as needed-04/13/2022-will not go home on oxycodone just tramadol ?- 5/1- pt pain controlled- will be able to go home on Tramadol- not Oxy- she's having more pain due to exercise it appears,  ? 5/2- adding Duloxetine 30 mg nightly for nerve pain- explained will take a few days to kick in - will increase next week, or can be increased as of 04/23/22 at the earliest.  ? 5/3- no side effects- but slept better- change to 60 mg on  5/5 ?4. Mood: LCSW to follow for evaluation and support. ?- Antipsychotic agents: Not applicable ?5. Neuropsych: This patient is capable of making decisions on her own behalf. ?6. Skin/Wound Care: Pressure relief measures. ?- Monitor for recurrent peripheral edema with increase in activity. ?Modify RF as indicated. ?   -04/08/2022 on low air loss mattress: Continue that along with 85M Tegaderm for ankles  bilaterally per WOC ?   -04/12/2022 understands that she will have to remain on direct mattress ?    -Request that pressure is High ?   -04/15/22 switch to Kreg bed. ?   -04/16/2022-much improved pain with ROHO in chair and Kreg bed for coccyx pain ?   -04/17/2022 has adjusted to Kreg bed she continues to have 55M Tegaderm for ankles per WOC ? 5/1- tolerating Kreg bed ?7. Fluids/Electrolytes/Nutrition: Routine in and outs with follow-up chemistries ?  -4/29 Continue to monitor chemistries. ? 5/1- CO2 up to 35- will need to likely wean O2 as tolerated ? 5/3- will check labs in AM to see where CO2 is.  ?   ?Component Value Date/Time  ? NA 138 04/19/2022 0426  ? K 3.9 04/19/2022 0426  ? CL 97 (L) 04/19/2022 0426  ? CO2 35 (H) 04/19/2022 0426  ? GLUCOSE 96 04/19/2022 0426  ? BUN 21 04/19/2022 0426  ? CREATININE 0.98 04/19/2022 0426  ? CALCIUM 9.2 04/19/2022 0426  ? GFRNONAA >60 04/19/2022 0426  ?  ?8.  Enterococcus fecalis bacteremia: On ampicillin and ceftriaxone with end date of 04/25/22 ?-04/17/2022 continue ampicillin and ceftriaxone ?-04/18/22 Due to issues with PICC line overnight, pharmacy had to retime doses of both antibiotics. ?9.  ABLA due to UGIB: ON PPI BID.  We will add iron supplement with MiraLAX daily. ?  -Check anemia panel is question vitamin B12 and iron deficiency. ?   -4/20-has iron deficiency ?   -4/21-on IV iron as well as PO ?   -4/24 Hgb up to 8.2 ?   -Encouraged appropriate diet, with supplements as above. ?     5/1- Hb 9.1- stable ? CBC ?   ?Component Value Date/Time  ? WBC 7.4 04/19/2022 0426  ? RBC  3.51 (L) 04/19/2022 0426  ? HGB 9.1 (L) 04/19/2022 0426  ? HCT 31.1 (L) 04/19/2022 0426  ? PLT 249 04/19/2022 0426  ? MCV 88.6 04/19/2022 0426  ? MCH 25.9 (L) 04/19/2022 0426  ? MCHC 29.3 (L) 04/19/2022 [id number]

## 2022-04-21 NOTE — Progress Notes (Signed)
PT refused cpap 

## 2022-04-21 NOTE — Progress Notes (Signed)
Occupational Therapy Session Note ? ?Patient Details  ?Name: Florian Lite ?MRN: [id number] ?Date of Birth: 10-May-1951 ? ?Today's Date: 04/21/2022 ?OT Individual Time: [id number] ?OT Individual Time Calculation (min): 55 min  ? ? ?Short Term Goals: ?Week 2:  OT Short Term Goal 1 (Week 2): Pt will be able to get to EOB with max A +1 ?OT Short Term Goal 2 (Week 2): Pt will perform stand pivot transfer to [facility] with RW with mod A ?OT Short Term Goal 3 (Week 2): Pt will don shirt sitting EOB with mod A ?OT Short Term Goal 4 (Week 2): Pt will perform 2 grooming tasks with setup (ie wash face, brush teeth) ? ?Skilled Therapeutic Interventions/Progress Updates:  ?  Pt missed 20 mins skilled OT services for nursing care. Pt resting in bed upon arrival. Tot A+2 for repositioning in bed. Initial OT intervention with focus on LUE PROM/AAROM during functional reaching tasks. Intervention progressed to AROM for functional reaching tasks to simulate grooming tasks and washing face. Pt with increased ROM this morning. Bed moblity sitting EOB with min A supine>sit with HOB elevated and using bed rails. Hand off to PT for stand pivot transfer to w/c.  ? ?Therapy Documentation ?Precautions:  ?Precautions ?Precautions: Fall ?Restrictions ?Weight Bearing Restrictions: No ?General: ?General ?OT Amount of Missed Time: 20 Minutes ? ? ?Pain: ? Pt reports LUE soreness but better then yesterday morning; repositioned and stretching/PROM ? ? ?Therapy/Group: Individual Therapy ? ?Leroy Libman ?04/21/2022, 11:57 AM ?

## 2022-04-21 NOTE — Progress Notes (Signed)
Physical Therapy Session Note ? ?Patient Details  ?Name: Stacy Hanson ?MRN: [id number] ?Date of Birth: 06/21/1951 ? ?Today's Date: 04/21/2022 ?PT Individual Time: 7124-5809; 1300-1410 ?PT Individual Time Calculation (min): 70 min and 70 min ? ?Short Term Goals: ?Week 2:  PT Short Term Goal 1 (Week 2): Pt will perform least restrictive transfer with mod A x 1 consistently ?PT Short Term Goal 2 (Week 2): Pt will initiate gait training ?PT Short Term Goal 3 (Week 2): Pt will initiate w/c mobility ? ?Skilled Therapeutic Interventions/Progress Updates:  ?  Session 1: ?Pt received seated in bed, agreeable to PT session. Pt reports pain in her L neck, upper shoulder region due to working UE during OT session. Provided short-acting hot pack for use at end of session. Seated in bed to sitting EOB with min A for some trunk control. Pt requires increased time and encouragement for LE management. Sit to stand with min A x 2 to RW with pt pushing through therapist UE under her UE, no assist with lifting required. Stand pivot transfer bed to w/c with RW and min A. Dependent transport via w/c to/from therapy gym for time and energy conservation. Ambulation x 15 ft, x 6 ft with RW and min A with close w/c follow for safety. Pt exhibits improved endurance and tolerance for gait training this date, does exhibit decreased ability to clear LLE during gait this date. Standing alt L/R target taps with RW and min A for balance, 2 x 10 reps to fatigue. Pt continues to exhibit decreased ability to lift LLE with increased reliance on UE (R>L) on RW in standing. Pt requests to return to bed at end of session. Sit to supine assist x 2 for LE management and trunk control. Pt left seated in bed with needs in reach at end of session. Pt on 1L O2 via Susan Moore throughout session, SpO2 remains at 90% and higher with activity. Short-acting hot pack placed on L upper shoulder/lateral neck region at end of session for pain management. ? ?Session 2: ?Pt  received seated in bed, agreeable to PT session. Pt reports increase in nerve pain in BLE (R>L) this PM. Pt able to receive pain medication during therapy session. Pt reports urge to urinate, declines to transfer to [facility] and requests to use urinal at bed level. Pt is dependent for placement and use of urinal at bed level, dependent for pericare and brief change following toileting. Seated in bed to sitting EOB with min A for some trunk control. Sit to stand with min A x 2 to RW with similar method as mentioned above. Stand pivot transfer to w/c with RW and min A. Seated endurance task performing 2# dowel rod volleyball, 2 x 15-20 reps to fatigue with improved ability to utilize LUE to assist with exercise. Pt requests to return to bed at end of session. Stand pivot transfer back to bed with min A and RW. Sit to supine assist x 2-3 for LE management and trunk control. Pt left seated in bed with needs in reach at end of session. Pt's husband Kathlene November present at end of session, discussed pt's home setup and provided home measurement sheet. ? ?Therapy Documentation ?Precautions:  ?Precautions ?Precautions: Fall ?Restrictions ?Weight Bearing Restrictions: No ? ? ? ? ? ? ?Therapy/Group: Individual Therapy ? ? ?Peter Congo, PT, DPT, CSRS ?04/21/2022, 3:19 PM  ?

## 2022-04-22 DIAGNOSIS — R5381 Other malaise: Secondary | ICD-10-CM | POA: Diagnosis not present

## 2022-04-22 DIAGNOSIS — R7989 Other specified abnormal findings of blood chemistry: Secondary | ICD-10-CM | POA: Diagnosis not present

## 2022-04-22 DIAGNOSIS — I4891 Unspecified atrial fibrillation: Secondary | ICD-10-CM | POA: Diagnosis not present

## 2022-04-22 DIAGNOSIS — I509 Heart failure, unspecified: Secondary | ICD-10-CM

## 2022-04-22 LAB — BASIC METABOLIC PANEL
Anion gap: 9 (ref 5–15)
BUN: 23 mg/dL (ref 8–23)
CO2: 31 mmol/L (ref 22–32)
Calcium: 9.4 mg/dL (ref 8.9–10.3)
Chloride: 98 mmol/L (ref 98–111)
Creatinine, Ser: 1.14 mg/dL — ABNORMAL HIGH (ref 0.44–1.00)
GFR, Estimated: 51 mL/min — ABNORMAL LOW (ref >60)
Glucose, Bld: 93 mg/dL (ref 70–99)
Potassium: 3.8 mmol/L (ref 3.5–5.1)
Sodium: 138 mmol/L (ref 135–145)

## 2022-04-22 MED ORDER — FUROSEMIDE 40 MG PO TABS
40.0000 mg | ORAL_TABLET | Freq: Two times a day (BID) | ORAL | Status: DC
  Administered 2022-04-22 – 2022-05-03 (×22): 40 mg via ORAL
  Filled 2022-04-22 (×22): qty 1

## 2022-04-22 NOTE — Plan of Care (Signed)
?  Problem: RH Balance ?Goal: LTG Patient will maintain dynamic standing balance (PT) ?Description: LTG:  Patient will maintain dynamic standing balance with assistance during mobility activities (PT) ?Flowsheets (Taken 04/22/2022 1226) ?LTG: Pt will maintain dynamic standing balance during mobility activities with:: (upgrade due to progress) Supervision/Verbal cueing ?Note: Upgrade due to progress ?  ?Problem: Sit to Stand ?Goal: LTG:  Patient will perform sit to stand with assistance level (PT) ?Description: LTG:  Patient will perform sit to stand with assistance level (PT) ?Flowsheets (Taken 04/22/2022 1226) ?LTG: PT will perform sit to stand in preparation for functional mobility with assistance level: (Upgrade due to progress) Supervision/Verbal cueing ?Note: Upgrade due to progress ?  ?Problem: RH Bed to Chair Transfers ?Goal: LTG Patient will perform bed/chair transfers w/assist (PT) ?Description: LTG: Patient will perform bed to chair transfers with assistance (PT). ?Flowsheets (Taken 04/22/2022 1226) ?LTG: Pt will perform Bed to Chair Transfers with assistance level: (Upgrade due to progress) Supervision/Verbal cueing ?Note: Upgrade due to progress ?  ?Problem: RH Ambulation ?Goal: LTG Patient will ambulate in controlled environment (PT) ?Description: LTG: Patient will ambulate in a controlled environment, # of feet with assistance (PT). ?Flowsheets (Taken 04/22/2022 1226) ?LTG: Pt will ambulate in controlled environ  assist needed:: (Upgrade due to progress) Supervision/Verbal cueing ?LTG: Ambulation distance in controlled environment: 100 ft with LRAD ?Note: Upgrade due to progress ?  ?Problem: RH Wheelchair Mobility ?Goal: LTG Patient will propel w/c in controlled environment (PT) ?Description: LTG: Patient will propel wheelchair in controlled environment, # of feet with assist (PT) ?Flowsheets (Taken 04/22/2022 1226) ?LTG: Pt will propel w/c in controlled environ  assist needed:: (d/c due to progress) -- ?Note:  D/c due to progress ?  ?

## 2022-04-22 NOTE — Progress Notes (Signed)
Physical Therapy Weekly Progress Note ? ?Patient Details  ?Name: Stacy Hanson ?MRN: [id number] ?Date of Birth: 01-29-1951 ? ?Beginning of progress report period: April 15, 2022 ?End of progress report period: Apr 22, 2022 ? ?Today's Date: 04/22/2022 ? ?Patient has met 2 of 3 short term goals. Pt is making good progress towards therapy goals slowly but steadily. She current requires min A to perform rolling in bed with bedrails and supine to sit with use of hospital bed features, but does still require assist x 2 for sit to supine and for repositioning in bed due to global weakness and body habitus. She is able to perform sit to stand with min A x 1-2 to RW and is able to perform stand pivot transfers with CGA to min A with RW. Pt has been able to ambulate up to 20 ft with RW and min A for balance with close w/c follow for safety. Pt has exhibited improved motivation and participation in therapy sessions this reporting period. ? ?Patient continues to demonstrate the following deficits muscle weakness and muscle joint tightness, decreased cardiorespiratoy endurance and decreased oxygen support, and decreased sitting balance, decreased standing balance, decreased postural control, and decreased balance strategies and therefore will continue to benefit from skilled PT intervention to increase functional independence with mobility. ? ?Patient progressing toward long term goals..  Plan of care revisions: upgraded goals to Supervision for dynamic standing balance, transfers, sit to stand, and gait with increase in gait goal from 10 ft to 100 ft with LRAD. Discharged w/c goal due to inability to propel w/c that is currently available due to alignment of wheels and pt unsure if w/c will fit into her home environment so does not plan on using a w/c upon d/c home. ? ?PT Short Term Goals ?Week 2:  PT Short Term Goal 1 (Week 2): Pt will perform least restrictive transfer with mod A x 1 consistently ?PT Short Term Goal 1 -  Progress (Week 2): Met ?PT Short Term Goal 2 (Week 2): Pt will initiate gait training ?PT Short Term Goal 2 - Progress (Week 2): Met ?PT Short Term Goal 3 (Week 2): Pt will initiate w/c mobility (pt unable to adequately propel w/c available in her size, goal discontinued) ?PT Short Term Goal 3 - Progress (Week 2): Discontinued (comment) ?Week 3:  PT Short Term Goal 1 (Week 3): Pt will perform bed mobility with assist x 1 consistently ?PT Short Term Goal 2 (Week 3): Pt will ambulate x 50 ft with LRAD and min A ?PT Short Term Goal 3 (Week 3): Pt will tolerate standing x 5 min while performing functional task ?PT Short Term Goal 4 (Week 3): Pt will perform sit to stand with min A x 1 consistently ? ?Therapy Documentation ?Precautions:  ?Precautions ?Precautions: Fall ?Restrictions ?Weight Bearing Restrictions: No ? ? ? ? ?Therapy/Group: Individual Therapy ? ? ?[name], PT, DPT, CSRS ?04/22/2022, 7:51 AM  ?

## 2022-04-22 NOTE — Progress Notes (Signed)
?                                                       PROGRESS NOTE ? ? ?Subjective/Complaints: ? ?Pt with no new complaints. She asked about labs from AM, reviewed and all questions answered.  ? ?ROS:  ? ?Pt denies SOB, abd pain, CP, N/V/C/D, and  HA ? ?Objective: ?  ?No results found. ?No results for input(s): WBC, HGB, HCT, PLT in the last 72 hours. ? ? ?Recent Labs  ?  04/22/22 ?0501  ?NA 138  ?K 3.8  ?CL 98  ?CO2 31  ?GLUCOSE 93  ?BUN 23  ?CREATININE 1.14*  ?CALCIUM 9.4  ? ? ? ?Intake/Output Summary (Last 24 hours) at 04/22/2022 0755 ?Last data filed at 04/22/2022 0442 ?Gross per 24 hour  ?Intake 714 ml  ?Output 1150 ml  ?Net -436 ml  ? ?  ? ?  ? ?Physical Exam: ? ? ? ? ?General: awake, alert, appropriate, sitting in WC, NAD ?HENT: conjugate gaze; oropharynx moist ?CV: regular rate; no JVD ?Pulmonary: CTA B/L; no W/R/R- good air movement- on O2 by  2L ?GI: soft, NT, ND, (+)BS- protuberant ?Psychiatric: appropriate; brighter affect ?Neurological: Ox3 ?Musculoskeletal:in WC ?Comments: Diffuse weakness ?Skin: ?General: Skin warm and dry.  Wounds with Tegaderm on bilateral ankles for chronic areas however both appear clean. +2 LE edema B/L  ? ? ?Vital Signs ?Blood pressure (!) 155/65, pulse 83, temperature 98.1 ?F (36.7 ?C), resp. rate 18, height 5\' 3"  (1.6 m), weight (!) 146.6 kg, SpO2 95 %. ? ? ? ?Assessment/Plan: ?1. Functional deficits which require 3+ hours per day of interdisciplinary therapy in a comprehensive inpatient rehab setting. ?Physiatrist is providing close team supervision and 24 hour management of active medical problems listed below. ?Physiatrist and rehab team continue to assess barriers to discharge/monitor patient progress toward functional and medical goals ? ?Care Tool: ? ?Bathing ?   ?Body parts bathed by patient: Face, Chest  ? Body parts bathed by helper: Right arm, Left arm, Buttocks, Front perineal area, Abdomen ?  ?  ?Bathing assist Assist Level: 2 Helpers (second helper for rolling,  lifting legs for pericare) ?  ?  ?Upper Body Dressing/Undressing ?Upper body dressing   ?What is the patient wearing?: Pull over shirt ?   ?Upper body assist Assist Level: Moderate Assistance - Patient 50 - 74% ?   ?Lower Body Dressing/Undressing ?Lower body dressing ? ? ?   ?What is the patient wearing?: Incontinence brief ? ?  ? ?Lower body assist Assist for lower body dressing: 2 Helpers ?   ? ?Toileting ?Toileting Toileting Activity did not occur Landscape architect and hygiene only):  (incontient)  ?Toileting assist Assist for toileting: 2 Helpers ?  ?  ?Transfers ?Chair/bed transfer ? ?Transfers assist ? Chair/bed transfer activity did not occur: Refused ? ?Chair/bed transfer assist level: Minimal Assistance - Patient > 75% ?  ?  ?Locomotion ?Ambulation ? ? ?Ambulation assist ? ? Ambulation activity did not occur: Safety/medical concerns ? ?Assist level: 2 helpers ?Assistive device: Walker-rolling ?Max distance: 33'  ? ?Walk 10 feet activity ? ? ?Assist ? Walk 10 feet activity did not occur: Safety/medical concerns ? ?Assist level: 2 helpers ?Assistive device: Walker-rolling  ? ?Walk 50 feet activity ? ? ?Assist Walk 50 feet with 2 turns activity did  not occur: Safety/medical concerns ? ?  ?   ? ? ?Walk 150 feet activity ? ? ?Assist Walk 150 feet activity did not occur: Safety/medical concerns ? ?  ?  ?  ? ?Walk 10 feet on uneven surface  ?activity ? ? ?Assist Walk 10 feet on uneven surfaces activity did not occur: Safety/medical concerns ? ? ?  ?   ? ?Wheelchair ? ? ? ? ?Assist Is the patient using a wheelchair?: Yes ?Type of Wheelchair: Manual ?Wheelchair activity did not occur: Refused ? ?Wheelchair assist level: Minimal Assistance - Patient > 75% ?Max wheelchair distance: 10'  ? ? ?Wheelchair 50 feet with 2 turns activity ? ? ? ?Assist ? ?  ?Wheelchair 50 feet with 2 turns activity did not occur: Refused ? ? ?   ? ?Wheelchair 150 feet activity  ? ? ? ?Assist ? Wheelchair 150 feet activity did not occur:  Refused ? ? ?   ? ?Blood pressure (!) 155/65, pulse 83, temperature 98.1 ?F (36.7 ?C), resp. rate 18, height 5\' 3"  (1.6 m), weight (!) 146.6 kg, SpO2 95 %. ? ?Medical Problem List and Plan: ?1. Functional deficits secondary to debility-for complicated medical stay-CHF exacerbation and bacteremia ?           - Patient may shower ?            -ELOS/Goals: 14 to 18 days MinA PT and OT ?           D/c 5/18 ? Continue CIR- PT, OT - walked 5 ft this week with RW ?2.  Antithrombotics: ?-DVT/anticoagulation: Mechanical: Sequential compression devices,   below-knee bilateral lower extremities ?           -04/09/2022 Dopplers negative for DVT's ?4/22-patient refusing to use SCDs however educated on need to use them at risk for DVT's ?            -antiplatelet therapy: N/A due to GIB ?3.  Left hip and shoulder pain with myalgias, pain management: Oxycodone as needed for diffuse pain. ?04/08/2022-we will schedule tramadol 100 mg every 8 hours not every 6 hours due to renal issues-creatinine 1.31-continue oxycodone for pain as needed-was taking NSAIDs at home which caused a GI bleed. ?04/12/2022-continue tramadol scheduled and oxycodone as needed-04/13/2022-will not go home on oxycodone just tramadol ?- 5/1- pt pain controlled- will be able to go home on Tramadol- not Oxy- she's having more pain due to exercise it appears,  ? 5/2- adding Duloxetine 30 mg nightly for nerve pain- explained will take a few days to kick in - will increase next week, or can be increased as of 04/23/22 at the earliest.  ? 5/3- no side effects- but slept better- change to 60 mg on 5/5 ?4. Mood: LCSW to follow for evaluation and support. ?- Antipsychotic agents: Not applicable ?5. Neuropsych: This patient is capable of making decisions on her own behalf. ?6. Skin/Wound Care: Pressure relief measures. ?- Monitor for recurrent peripheral edema with increase in activity. ?Modify RF as indicated. ?   -04/08/2022 on low air loss mattress: Continue that along with  55M Tegaderm for ankles bilaterally per WOC ?   -04/12/2022 understands that she will have to remain on direct mattress ?    -Request that pressure is High ?   -04/15/22 switch to Kreg bed. ?   -04/16/2022-much improved pain with ROHO in chair and Kreg bed for coccyx pain ?   -04/17/2022 has adjusted to Kreg bed she continues to have 55M Tegaderm  for ankles per WOC ? 5/1- tolerating Kreg bed ?7. Fluids/Electrolytes/Nutrition: Routine in and outs with follow-up chemistries ?  -4/29 Continue to monitor chemistries. ? 5/1- CO2 up to 35- will need to likely wean O2 as tolerated ? 5/3- will check labs in AM to see where CO2 is.  ? 5/4 Co2 31, continue current treatment ?   ?Component Value Date/Time  ? NA 138 04/22/2022 0501  ? K 3.8 04/22/2022 0501  ? CL 98 04/22/2022 0501  ? CO2 31 04/22/2022 0501  ? GLUCOSE 93 04/22/2022 0501  ? BUN 23 04/22/2022 0501  ? CREATININE 1.14 (H) 04/22/2022 0501  ? CALCIUM 9.4 04/22/2022 0501  ? GFRNONAA 51 (L) 04/22/2022 0501  ?  ?8.  Enterococcus fecalis bacteremia: On ampicillin and ceftriaxone with end date of 04/25/22 ?-04/17/2022 continue ampicillin and ceftriaxone ?-04/18/22 Due to issues with PICC line overnight, pharmacy had to retime doses of both antibiotics. ?9.  ABLA due to UGIB: ON PPI BID.  We will add iron supplement with MiraLAX daily. ?  -Check anemia panel is question vitamin B12 and iron deficiency. ?   -4/20-has iron deficiency ?   -4/21-on IV iron as well as PO ?   -4/24 Hgb up to 8.2 ?   -Encouraged appropriate diet, with supplements as above. ?     5/1- Hb 9.1- stable ? CBC ?   ?Component Value Date/Time  ? WBC 7.4 04/19/2022 0426  ? RBC 3.51 (L) 04/19/2022 0426  ? HGB 9.1 (L) 04/19/2022 0426  ? HCT 31.1 (L) 04/19/2022 0426  ? PLT 249 04/19/2022 0426  ? MCV 88.6 04/19/2022 0426  ? MCH 25.9 (L) 04/19/2022 0426  ? MCHC 29.3 (L) 04/19/2022 0426  ? RDW 18.0 (H) 04/19/2022 0426  ? LYMPHSABS 1.1 04/08/2022 0603  ? MONOABS 0.6 04/08/2022 0603  ? EOSABS 0.4 04/08/2022 0603  ?  BASOSABS 0.1 04/08/2022 0603  ? ?10. CAF: Monitor HR TID-Continue metoprolol. ?4/21-rate controlled-continue regimen ?4/29 HR stable, in the 70's ?5/4 HR in 80s today, monitor ? ? ?  04/22/2022  ?  4:33 AM 5/3/2

## 2022-04-22 NOTE — Progress Notes (Signed)
Occupational Therapy Session Note ? ?Patient Details  ?Name: Stacy Hanson ?MRN: [id number] ?Date of Birth: [birth year] ? ?Today's Date: 04/22/2022 ?OT Individual Time: 1601-0932 ?OT Individual Time Calculation (min): 75 min  ? ? ?Short Term Goals: ?Week 2:  OT Short Term Goal 1 (Week 2): Pt will be able to get to EOB with max A +1 ?OT Short Term Goal 2 (Week 2): Pt will perform stand pivot transfer to [facility] with RW with mod A ?OT Short Term Goal 3 (Week 2): Pt will don shirt sitting EOB with mod A ?OT Short Term Goal 4 (Week 2): Pt will perform 2 grooming tasks with setup (ie wash face, brush teeth) ? ?Skilled Therapeutic Interventions/Progress Updates:  ?  Pt resting in bed upon arrival. OT intervention with focus on bed mobility, sit<>stand, standing balance, functional transfers, grooming, BUE therex and AROM to increase independence with BADLs. O2 >90% on 1L O2. Supine>sit EOB with min A with HOB elevated and using bed rails. Sit<>stand with min A. Stand pivot transfer with CGA to w/c. Pt required min A for doffing/donning new personal night gown. Pt was face and brushed hair with supervision while seated in w/c. Pt grasped therapy ball with BUE and performed chest presses and shoulder flexion/elevations activities. Improved AROM noted (3x5 sets.) Soft tissue mobilizations to Rt upper traps and rhomboids with relief noted. Pt remained seated in w/c with all needs within reach.  ? ?Therapy Documentation ?Precautions:  ?Precautions ?Precautions: Fall ?Restrictions ?Weight Bearing Restrictions: No ? ?Pain: ?Pt reports BUE and shoulder soreness; PROM/AAROM/AROM ? ? ?Therapy/Group: Individual Therapy ? ?Rich Brave ?04/22/2022, 10:48 AM ?

## 2022-04-22 NOTE — Progress Notes (Signed)
Physical Therapy Session Note ? ?Patient Details  ?Name: Stacy Hanson ?MRN: [id number] ?Date of Birth: [birth year] ? ?Today's Date: 04/22/2022 ?PT Individual Time: 7544-9201; 0071-2197 ?PT Individual Time Calculation (min): 80 min and 75 min ? ?Short Term Goals: ?Week 2:  PT Short Term Goal 1 (Week 2): Pt will perform least restrictive transfer with mod A x 1 consistently ?PT Short Term Goal 2 (Week 2): Pt will initiate gait training ?PT Short Term Goal 3 (Week 2): Pt will initiate w/c mobility ? ?Skilled Therapeutic Interventions/Progress Updates:  ?  Session 1: ?Pt received seated in w/c in room handed off from OT session. Pt reports 8/10 pain this morning in B feet/ankles, LUE, and her back. Pt able to receive pain medication at beginning of therapy session. Dependent transport via w/c to/from therapy gym for time and energy conservation. Sit to stand with min A x 2 to RW initially, increases to mod A by end of session. Ambulation x 20 ft, x 12 ft with RW and min A for balance with close w/c follow for safety. Pt continues to exhibit improved tolerance for gait training with increased distance noted each session. Pt does continue to exhibit some difficulty with clearing LLE during gait but steps are improved this date from previous sessions. Pt also exhibits flexed trunk posture with heavy reliance on RUE forearm support on RW during gait. Standing alt reaching with UE to improved upright posture and decreased reliance on UE support on RW in standing, 3 x 10 reaches to fatigue. Pt on 1L O2 via [place] throughout session, SpO2 remains at 90% (+) with activity. Pt requests to return to bed at end of session. Stand pivot transfer w/c to bed with min A and RW. Once seated EOB pt reports urge to urinate, encouraged her to transfer to [facility] for toileting. Pt refuses and requests to use urinal at bed level and she states this is "easier". Sit to supine assist x 2 for LE management and trunk control. Pt is dependent for brief  management and use of urinal at bed level, able to continently void urine. Pt requires assist x 2-3 to reposition in bed comfortably. Pt left seated in bed with needs in reach, husband present. ? ?Session 2: ?Pt received seated in bed, agreeable to PT session. No complaints of pain this PM. Discussed pt's home setup and equipment that will be ordered prior to d/c home (at this point just bariatric RW). Updated CSW on equipment. Discussed use of bedrails for increased independence in/out of bed. Pt reports urge to use the bathroom. Seated in bed to sitting EOB with CGA. Sit to stand with min A x 2 to RW. Stand pivot transfer bed to bariatric BSC with use of RW and min A. Pt able to continently void while seated on BSC, see Flowsheet for details. Pt is dependent for pericare and donning of new brief. Seated BUE endurance activity playing volleywall with weighted 1# dowel rod: 3 x 15 reps to fatigue. Pt requests to return to bed at end of session. Sit to stand with min A x 2 to RW. Stand pivot transfer back to bed with CGA. Sit to supine assist x 2 needed for BLE management. Pt left seated in bed with needs in reach, bed alarm in place at end of session. ? ?Therapy Documentation ?Precautions:  ?Precautions ?Precautions: Fall ?Restrictions ?Weight Bearing Restrictions: No ? ? ? ? ?Therapy/Group: Individual Therapy ? ? ?Peter Congo, PT, DPT, CSRS ? ?04/22/2022, 12:11 PM  ?

## 2022-04-22 NOTE — Progress Notes (Addendum)
Patient ID: Stacy Hanson, female   DOB: [birth year], 71 y.o.   MRN: [id number] ? ?SW ordered w/c, bariatric 3in1 BSC and RW for patient with [facility] via parachute (expected to come to room by 5/16).  ? ?SW met with pt in room to inform on items ordered. SW discussed preferred HHA. SW provided HHA list ([email or web address]), and SW will follow-up. ? ?*[facility] report pt has already has BSC and unable to get a new one.  ? ?SW met with pt in room to inform on above about DME.  ? ?[name], MSW, LCSWA ?Office: [phone] ?Cell: [phone] ?Fax: [phone]  ?

## 2022-04-22 NOTE — Progress Notes (Signed)
PT stated she will put on CPAP by herself ?

## 2022-04-23 DIAGNOSIS — I509 Heart failure, unspecified: Secondary | ICD-10-CM | POA: Diagnosis not present

## 2022-04-23 DIAGNOSIS — M79609 Pain in unspecified limb: Secondary | ICD-10-CM

## 2022-04-23 DIAGNOSIS — D649 Anemia, unspecified: Secondary | ICD-10-CM

## 2022-04-23 DIAGNOSIS — R7989 Other specified abnormal findings of blood chemistry: Secondary | ICD-10-CM | POA: Diagnosis not present

## 2022-04-23 LAB — CBC WITH DIFFERENTIAL/PLATELET
Abs Immature Granulocytes: 0.04 10*3/uL (ref 0.00–0.07)
Basophils Absolute: 0.1 10*3/uL (ref 0.0–0.1)
Basophils Relative: 1 %
Eosinophils Absolute: 0.5 10*3/uL (ref 0.0–0.5)
Eosinophils Relative: 7 %
HCT: 31.5 % — ABNORMAL LOW (ref 36.0–46.0)
Hemoglobin: 9.6 g/dL — ABNORMAL LOW (ref 12.0–15.0)
Immature Granulocytes: 1 %
Lymphocytes Relative: 13 %
Lymphs Abs: 1 10*3/uL (ref 0.7–4.0)
MCH: 27.2 pg (ref 26.0–34.0)
MCHC: 30.5 g/dL (ref 30.0–36.0)
MCV: 89.2 fL (ref 80.0–100.0)
Monocytes Absolute: 0.6 10*3/uL (ref 0.1–1.0)
Monocytes Relative: 8 %
Neutro Abs: 5.3 10*3/uL (ref 1.7–7.7)
Neutrophils Relative %: 70 %
Platelets: 273 10*3/uL (ref 150–400)
RBC: 3.53 MIL/uL — ABNORMAL LOW (ref 3.87–5.11)
RDW: 18.2 % — ABNORMAL HIGH (ref 11.5–15.5)
WBC: 7.5 10*3/uL (ref 4.0–10.5)
nRBC: 0 % (ref 0.0–0.2)

## 2022-04-23 LAB — BASIC METABOLIC PANEL
Anion gap: 7 (ref 5–15)
BUN: 24 mg/dL — ABNORMAL HIGH (ref 8–23)
CO2: 33 mmol/L — ABNORMAL HIGH (ref 22–32)
Calcium: 9.3 mg/dL (ref 8.9–10.3)
Chloride: 101 mmol/L (ref 98–111)
Creatinine, Ser: 1.08 mg/dL — ABNORMAL HIGH (ref 0.44–1.00)
GFR, Estimated: 54 mL/min — ABNORMAL LOW (ref >60)
Glucose, Bld: 102 mg/dL — ABNORMAL HIGH (ref 70–99)
Potassium: 4.2 mmol/L (ref 3.5–5.1)
Sodium: 141 mmol/L (ref 135–145)

## 2022-04-23 MED ORDER — DULOXETINE HCL 60 MG PO CPEP
60.0000 mg | ORAL_CAPSULE | Freq: Every day | ORAL | Status: DC
  Administered 2022-04-23: 60 mg via ORAL
  Filled 2022-04-23: qty 1

## 2022-04-23 NOTE — Progress Notes (Signed)
Right lower extremity changed per order. Soap water and pat dry, 3 M dressing applied. Dressing to be changed in 21 days.  ?

## 2022-04-23 NOTE — Progress Notes (Signed)
Patient ID: Stacy Hanson, female   DOB: August 06, 1951, 71 y.o.   MRN: [id number] ? ?SW met with pt to discuss HHAs. Would like [facility] or [facility]. SW sent referral to Cheryl[facility] and waiting on follow-up.  ? ?HHPT/OT/SN/aide referral accepted by [facility].  ? ?[name], MSW, LCSWA ?Office: [phone] ?Cell: [phone] ?Fax: [phone]  ?

## 2022-04-23 NOTE — Plan of Care (Signed)
?  Problem: RH Balance ?Goal: LTG Patient will maintain dynamic standing with ADLs (OT) ?Description: LTG:  Patient will maintain dynamic standing balance with assist during activities of daily living (OT)  ?Flowsheets (Taken 04/23/2022 1523) ?LTG: Pt will maintain dynamic standing balance during ADLs with: (downgraded JLS) Supervision/Verbal cueing ?Note: downgraded JLS ?  ?Problem: Sit to Stand ?Goal: LTG:  Patient will perform sit to stand in prep for activites of daily living with assistance level (OT) ?Description: LTG:  Patient will perform sit to stand in prep for activites of daily living with assistance level (OT) ?Flowsheets (Taken 04/23/2022 1523) ?LTG: PT will perform sit to stand in prep for activites of daily living with assistance level: (downgraded JLS) Supervision/Verbal cueing ?Note: downgraded JLS ?  ?Problem: RH Dressing ?Goal: LTG Patient will perform upper body dressing (OT) ?Description: LTG Patient will perform upper body dressing with assist, with/without cues (OT). ?Flowsheets (Taken 04/23/2022 1523) ?LTG: Pt will perform upper body dressing with assistance level of: (downgraded JLS) Supervision/Verbal cueing ?Note: downgraded JLS ?  ?Problem: RH Toilet Transfers ?Goal: LTG Patient will perform toilet transfers w/assist (OT) ?Description: LTG: Patient will perform toilet transfers with assist, with/without cues using equipment (OT) ?Flowsheets (Taken 04/23/2022 1523) ?LTG: Pt will perform toilet transfers with assistance level of: (downgraded JLS) Supervision/Verbal cueing ?Note: downgraded JLS ?  ?

## 2022-04-23 NOTE — Progress Notes (Signed)
Occupational Therapy Weekly Progress Note ? ?Patient Details  ?Name: Stacy Hanson ?MRN: [id number] ?Date of Birth: 12-04-1951 ? ?Beginning of progress report period: April 16, 2022 ?End of progress report period: Apr 23, 2022 ? ?Patient has met 4 of 4 short term goals.  Pt made steady progress with BADLs and functional transfers during the past week. LUE AROM improving and pt able to use LUE to assist with washing face and transfers with RW. Sit<>stand from EOB with min A. Standing balance with mod A at sink. Pt wearing night gowns for clothing and requires mod A to don and max A to doff. Pt dependent for toileting. LTG upgraded. ? ?Patient continues to demonstrate the following deficits: muscle weakness, decreased cardiorespiratoy endurance, and decreased standing balance, decreased balance strategies, and difficulty maintaining precautions and therefore will continue to benefit from skilled OT intervention to enhance overall performance with BADL and Reduce care partner burden. ? ?Patient progressing toward long term goals..  Continue plan of care. ? ?OT Short Term Goals ?Week 2:  OT Short Term Goal 1 (Week 2): Pt will be able to get to EOB with max A +1 ?OT Short Term Goal 1 - Progress (Week 2): Met ?OT Short Term Goal 2 (Week 2): Pt will perform stand pivot transfer to [facility] with RW with mod A ?OT Short Term Goal 2 - Progress (Week 2): Met ?OT Short Term Goal 3 (Week 2): Pt will don shirt sitting EOB with mod A ?OT Short Term Goal 3 - Progress (Week 2): Met ?OT Short Term Goal 4 (Week 2): Pt will perform 2 grooming tasks with setup (ie wash face, brush teeth) ?OT Short Term Goal 4 - Progress (Week 2): Met ?Week 3:  OT Short Term Goal 1 (Week 3): Pt will perform BSC tranfser with min A stand pivot with RW ?OT Short Term Goal 2 (Week 3): Pt will maintain standing balance at sink with min A for grooming tasks ?OT Short Term Goal 3 (Week 3): Pt will perform UB dressing tasks with min A ? ? ?Stacy Hanson ?04/23/2022, 7:01 AM  ?

## 2022-04-23 NOTE — Progress Notes (Signed)
Occupational Therapy Session Note ? ?Patient Details  ?Name: Stacy Hanson ?MRN: [id number] ?Date of Birth: Mar 29, 1951 ? ?Today's Date: 04/23/2022 ?OT Individual Time: 5498-2641 ?OT Individual Time Calculation (min): 73 min  ? ? ?Short Term Goals: ?Week 3:  OT Short Term Goal 1 (Week 3): Pt will perform BSC tranfser with min A stand pivot with RW ?OT Short Term Goal 2 (Week 3): Pt will maintain standing balance at sink with min A for grooming tasks ?OT Short Term Goal 3 (Week 3): Pt will perform UB dressing tasks with min A ? ?Skilled Therapeutic Interventions/Progress Updates:  ?  Pt resting in bed upon arrival and agreeable to getting OOB to w/c. Pt's husband brought pt's LE compression wraps in and pt instructed OTA in donning. Supine>sit EOB with HOB elevated with min A. Pt able to scoot to EOB with supervision. Sit>stand from EOB with CGA. Stand pivot transfer to w/c using RW with CGA. Stand>sit in w/c with supervision. BLE complression wraps donned. LUE PROM and soft tissue mobs prior to AAROM/AROM during reaching tasks. Continued discharge planning with pt showing OTA pictures of home layout. Pt has walk-in tub with door. Discussed LTGs and pt's progress. Pt remained in w/c with all needs within reach.  ? ?Therapy Documentation ?Precautions:  ?Precautions ?Precautions: Fall ?Restrictions ?Weight Bearing Restrictions: No ?  ?Pain: ?Pt c/o BLE/foot tenderness this morning; compression wraps applied ? ?Therapy/Group: Individual Therapy ? ?Rich Brave ?04/23/2022, 10:54 AM ?

## 2022-04-23 NOTE — Progress Notes (Signed)
Received report from Kym Groom LPN at 4158.  ?

## 2022-04-23 NOTE — Progress Notes (Signed)
?                                                       PROGRESS NOTE ? ? ?Subjective/Complaints: ? ?Reports doing well this AM. She asks about her lab results from this AM. No additional concerns.  ? ?ROS:  ? ?Pt denies SOB, abd pain, CP, N/V/C/D, and  HA, no vision changes ? ?Objective: ?  ?No results found. ?Recent Labs  ?  04/23/22 ?0427  ?WBC 7.5  ?HGB 9.6*  ?HCT 31.5*  ?PLT 273  ? ? ? ?Recent Labs  ?  04/22/22 ?0501 04/23/22 ?0427  ?NA 138 141  ?K 3.8 4.2  ?CL 98 101  ?CO2 31 33*  ?GLUCOSE 93 102*  ?BUN 23 24*  ?CREATININE 1.14* 1.08*  ?CALCIUM 9.4 9.3  ? ? ? ?Intake/Output Summary (Last 24 hours) at 04/23/2022 0755 ?Last data filed at 04/23/2022 0247 ?Gross per 24 hour  ?Intake 1300 ml  ?Output 550 ml  ?Net 750 ml  ? ?  ? ?  ? ?Physical Exam: ? ? ? ? ?General: awake, alert, appropriate, sitting in WC, NAD, pleasant ?HENT: conjugate gaze; oropharynx moist ?CV: regular rate; no JVD ?Pulmonary: CTA B/L; no W/R/R- good air movement- on O2 by [place] 2L, non-labored breathing ?GI: soft, NT, ND, (+)BS- protuberant ?Psychiatric: appropriate; brighter affect ?Neurological: alert and oriented ?Musculoskeletal:in WC ?Comments: Diffuse weakness ?Skin: ?General: Skin warm and dry.  Wounds with Tegaderm on bilateral ankles for chronic areas however both appear clean. +2 LE edema B/L  ? ? ?Vital Signs ?Blood pressure 99/87, pulse 82, temperature 98 ?F (36.7 ?C), resp. rate 17, height 5\' 3"  (1.6 m), weight (!) 146.6 kg, SpO2 98 %. ? ? ? ?Assessment/Plan: ?1. Functional deficits which require 3+ hours per day of interdisciplinary therapy in a comprehensive inpatient rehab setting. ?Physiatrist is providing close team supervision and 24 hour management of active medical problems listed below. ?Physiatrist and rehab team continue to assess barriers to discharge/monitor patient progress toward functional and medical goals ? ?Care Tool: ? ?Bathing ?   ?Body parts bathed by patient: Face, Chest  ? Body parts bathed by helper: Right arm,  Left arm, Buttocks, Front perineal area, Abdomen ?  ?  ?Bathing assist Assist Level: 2 Helpers (second helper for rolling, lifting legs for pericare) ?  ?  ?Upper Body Dressing/Undressing ?Upper body dressing   ?What is the patient wearing?: Pull over shirt ?   ?Upper body assist Assist Level: Moderate Assistance - Patient 50 - 74% ?   ?Lower Body Dressing/Undressing ?Lower body dressing ? ? ?   ?What is the patient wearing?: Incontinence brief ? ?  ? ?Lower body assist Assist for lower body dressing: 2 Helpers ?   ? ?Toileting ?Toileting Toileting Activity did not occur Press photographer and hygiene only):  (incontient)  ?Toileting assist Assist for toileting: 2 Helpers ?  ?  ?Transfers ?Chair/bed transfer ? ?Transfers assist ? Chair/bed transfer activity did not occur: Refused ? ?Chair/bed transfer assist level: Minimal Assistance - Patient > 75% ?  ?  ?Locomotion ?Ambulation ? ? ?Ambulation assist ? ? Ambulation activity did not occur: Safety/medical concerns ? ?Assist level: 2 helpers ?Assistive device: Walker-rolling ?Max distance: 20'  ? ?Walk 10 feet activity ? ? ?Assist ? Walk 10 feet activity did not occur: Safety/medical  concerns ? ?Assist level: 2 helpers ?Assistive device: Walker-rolling  ? ?Walk 50 feet activity ? ? ?Assist Walk 50 feet with 2 turns activity did not occur: Safety/medical concerns ? ?  ?   ? ? ?Walk 150 feet activity ? ? ?Assist Walk 150 feet activity did not occur: Safety/medical concerns ? ?  ?  ?  ? ?Walk 10 feet on uneven surface  ?activity ? ? ?Assist Walk 10 feet on uneven surfaces activity did not occur: Safety/medical concerns ? ? ?  ?   ? ?Wheelchair ? ? ? ? ?Assist Is the patient using a wheelchair?: Yes ?Type of Wheelchair: Manual ?Wheelchair activity did not occur: Refused ? ?Wheelchair assist level: Minimal Assistance - Patient > 75% ?Max wheelchair distance: 10'  ? ? ?Wheelchair 50 feet with 2 turns activity ? ? ? ?Assist ? ?  ?Wheelchair 50 feet with 2 turns activity  did not occur: Refused ? ? ?   ? ?Wheelchair 150 feet activity  ? ? ? ?Assist ? Wheelchair 150 feet activity did not occur: Refused ? ? ?   ? ?Blood pressure 99/87, pulse 82, temperature 98 ?F (36.7 ?C), resp. rate 17, height 5\' 3"  (1.6 m), weight (!) 146.6 kg, SpO2 98 %. ? ?Medical Problem List and Plan: ?1. Functional deficits secondary to debility-for complicated medical stay-CHF exacerbation and bacteremia ?           - Patient may shower ?            -ELOS/Goals: 14 to 18 days MinA PT and OT ? -walked 32ft yesterday with RW ?           D/c plan is 5/18 ? Continue CIR- PT, OT  ?2.  Antithrombotics: ?-DVT/anticoagulation: Mechanical: Sequential compression devices,   below-knee bilateral lower extremities ?           -04/09/2022 Dopplers negative for DVT's ?4/22-patient refusing to use SCDs however educated on need to use them at risk for DVT's ?            -antiplatelet therapy: N/A due to GIB ?3.  Left hip and shoulder pain with myalgias, pain management: Oxycodone as needed for diffuse pain. ?04/08/2022-we will schedule tramadol 100 mg every 8 hours not every 6 hours due to renal issues-creatinine 1.31-continue oxycodone for pain as needed-was taking NSAIDs at home which caused a GI bleed. ?04/12/2022-continue tramadol scheduled and oxycodone as needed-04/13/2022-will not go home on oxycodone just tramadol ?- 5/1- pt pain controlled- will be able to go home on Tramadol- not Oxy- she's having more pain due to exercise it appears,  ? 5/2- adding Duloxetine 30 mg nightly for nerve pain- explained will take a few days to kick in - will increase next week, or can be increased as of 04/23/22 at the earliest.  ? 5/3- no side effects- but slept better- change to 60 mg on 5/5 ? 5/5 Duloxetine dose increased to 60mg  daily ?4. Mood: LCSW to follow for evaluation and support. ?- Antipsychotic agents: Not applicable ?5. Neuropsych: This patient is capable of making decisions on her own behalf. ?6. Skin/Wound Care: Pressure  relief measures. ?- Monitor for recurrent peripheral edema with increase in activity. ?Modify RF as indicated. ?   -04/08/2022 on low air loss mattress: Continue that along with 48M Tegaderm for ankles bilaterally per WOC ?   -04/12/2022 understands that she will have to remain on direct mattress ?    -Request that pressure is High ?   -04/15/22 switch  to Kreg bed. ?   -04/16/2022-much improved pain with ROHO in chair and Kreg bed for coccyx pain ?   -04/17/2022 has adjusted to Kreg bed she continues to have 62M Tegaderm for ankles per WOC ? 5/1- tolerating Kreg bed ?7. Fluids/Electrolytes/Nutrition: Routine in and outs with follow-up chemistries ?  -4/29 Continue to monitor chemistries. ? 5/1- CO2 up to 35- will need to likely wean O2 as tolerated ? 5/3- will check labs in AM to see where CO2 is.  ? 5/4 Co2 31, continue current treatment ?   ?Component Value Date/Time  ? NA 141 04/23/2022 0427  ? K 4.2 04/23/2022 0427  ? CL 101 04/23/2022 0427  ? CO2 33 (H) 04/23/2022 0427  ? GLUCOSE 102 (H) 04/23/2022 0427  ? BUN 24 (H) 04/23/2022 0427  ? CREATININE 1.08 (H) 04/23/2022 0427  ? CALCIUM 9.3 04/23/2022 0427  ? GFRNONAA 54 (L) 04/23/2022 0427  ?  ?8.  Enterococcus fecalis bacteremia: On ampicillin and ceftriaxone with end date of 04/25/22 ?-04/17/2022 continue ampicillin and ceftriaxone ?-04/18/22 Due to issues with PICC line overnight, pharmacy had to retime doses of both antibiotics. ?9.  ABLA due to UGIB: ON PPI BID.  We will add iron supplement with MiraLAX daily. ?  -Check anemia panel is question vitamin B12 and iron deficiency. ?   -4/20-has iron deficiency ?   -4/21-on IV iron as well as PO ?   -4/24 Hgb up to 8.2 ?   -Encouraged appropriate diet, with supplements as above. ?     5/1- Hb 9.1- stable ? 5/5 HBG 9.6- stable, monitor ? CBC ?   ?Component Value Date/Time  ? WBC 7.5 04/23/2022 0427  ? RBC 3.53 (L) 04/23/2022 0427  ? HGB 9.6 (L) 04/23/2022 0427  ? HCT 31.5 (L) 04/23/2022 0427  ? PLT 273 04/23/2022 0427  ? MCV  89.2 04/23/2022 0427  ? MCH 27.2 04/23/2022 0427  ? MCHC 30.5 04/23/2022 0427  ? RDW 18.2 (H) 04/23/2022 0427  ? LYMPHSABS 1.0 04/23/2022 0427  ? MONOABS 0.6 04/23/2022 0427  ? EOSABS 0.5 04/23/2022 0427  ?

## 2022-04-23 NOTE — Progress Notes (Signed)
Physical Therapy Session Note ? ?Patient Details  ?Name: Stacy Hanson ?MRN: [id number] ?Date of Birth: 1951-03-17 ? ?Today's Date: 04/23/2022 ?PT Individual Time: 1107-1200, 1246-1300, and 1420-1530 ?PT Individual Time Calculation (min): 53 min, 14 min, and 70 min ? ?Short Term Goals: ?Week 3:  PT Short Term Goal 1 (Week 3): Pt will perform bed mobility with assist x 1 consistently ?PT Short Term Goal 2 (Week 3): Pt will ambulate x 50 ft with LRAD and min A ?PT Short Term Goal 3 (Week 3): Pt will tolerate standing x 5 min while performing functional task ?PT Short Term Goal 4 (Week 3): Pt will perform sit to stand with min A x 1 consistently ? ?Skilled Therapeutic Interventions/Progress Updates: Pt w/c agreeable to therapy. Pt noted increased fatigue from previous day's therapies. Pt stating brief "taped" together therefore pt hesitant to ambulate this session. PTA discussed with pt changing to Bariatric RW with PTA obtaining and pt agreeable. Upon return to room pt indicating urgent need for urinary void. Pt performed stand pivot transfer with modA for Sit to stand and minA for pivot. At [facility] pt with continent urinary void and BM. Pt total A for peri-care and PTA donned new brief. Pt then stood from [facility] with modA and returned to w/c. Once [place] RW set up pt ambulated forwards/backwards x 5 ft with minA. Pt requires cues for RW management and continues to lean on R side of RW. Pt was then able to perform x 3 additional Sit to stand from w/c with minA initially then requiring modA due to fatigue. Upon discussion with pt prior to ending session pt agreeable to remain in w/c for lunch and PTA with return as 12:45 to transfer pt back to bed. Pt left in w/c at end of session with call bell within reach and needs met.  ? ?Tx2: Pt presented in w/c completing lunch. Session focused on ambulatory transfers to return to bed. Pt performed stand pivot from w/c with minA and was able to appropriately ambulate back to bed. Pt  demonstrates improved management with current bari RW. At EOB pt continues to require maxA x 2 for sit to supine. In bed pt was able to assist with scoot to [facility] with pt able to flex knees and provide some push while PTA and tech boosted pt to [facility]. Pt repositioned to comfort and left with call bell within reach and needs met.  ? ?Tx3: Pt presented in bed with nsg and husband present agreeable to therapy. Once nsg completed IV bag change pt performed supine to sit with heavy use of bed features, increased time and CGA! At EOB pt performed ambulatory transfer to w/c with modA for Sit to stand and minA for pivot. Pt then transported to day room and participated in ambulation for endurance. Pt required minA for first stand and modA for subsequent stands. Pt ambulated 90f, and 157frespectively with seated rest between bouts. Pt ambulated in grip socks therefore was unable to slide LLE on floor. Pt indicated significant difficulty being able to clear LLE. After seated rest pt attempted toe taps to 2in step. Pt was able to complete x 5 on LLE and 2 x 5 on RLE. Pt continues to lean heavily to R side of RW to offload LLE. Pt was able to perform toe taps to 2in step in seated position. Discussed hip strengthening activities that can be performed in bed on days that pt can perform on w/e. Pt transported back to room and performed  stand pivot transfer to bed in same manner as prior. Performed sit to supine with maxA x 2. In bed able to perform B heel slides x 10, SLR on LLE but required AA to perform SLR on RLE. PTA demonstrating with gait belt and pt able to demonstrate with good carryover. Pt left in bed at end of session with bed alarm on, call bell within reach and needs met.   ?   ? ?Therapy Documentation ?Precautions:  ?Precautions ?Precautions: Fall ?Restrictions ?Weight Bearing Restrictions: No ?General: ?  ?Vital Signs: ?  ?Pain: ?  ?Mobility: ?  ?Locomotion : ?   ?Trunk/Postural Assessment : ?   ?Balance: ?   ?Exercises: ?  ?Other Treatments:   ? ? ? ?Therapy/Group: Individual Therapy ? ?Jannet Calip ?04/23/2022, 4:04 PM  ?

## 2022-04-24 DIAGNOSIS — R5381 Other malaise: Secondary | ICD-10-CM | POA: Diagnosis not present

## 2022-04-24 MED ORDER — DULOXETINE HCL 30 MG PO CPEP
30.0000 mg | ORAL_CAPSULE | Freq: Every day | ORAL | Status: DC
  Administered 2022-04-24 – 2022-04-26 (×3): 30 mg via ORAL
  Filled 2022-04-24 (×3): qty 1

## 2022-04-24 NOTE — Progress Notes (Signed)
PROGRESS NOTE   Subjective/Complaints: Patient seen today in bed.  Patient reports she is doing great and increasing her activity including distance walked with physical therapy.  She reports that she walked 20 feet at 1 instance and then a second time 12 feet she does state that she does have a little bit of an issue with raising the left leg but she can raise her right leg more easily but will continue to work with PT on this. She is currently on 1 L nasal cannula she also reports that she is making great progress with PT but gets very fatigued with PT today she also reports that her left her lower extremity edema is decreased which she is very pleased with as well as the rash that she had below her left breast has also improved.  ROS: Patient denies shortness of breath abdominal pain chest pain nausea vomiting constipation diarrhea or visual changes.   Objective:   No results found. Recent Labs    04/23/22 0427  WBC 7.5  HGB 9.6*  HCT 31.5*  PLT 273    Recent Labs    04/22/22 0501 04/23/22 0427  NA 138 141  K 3.8 4.2  CL 98 101  CO2 31 33*  GLUCOSE 93 102*  BUN 23 24*  CREATININE 1.14* 1.08*  CALCIUM 9.4 9.3    Intake/Output Summary (Last 24 hours) at 04/24/2022 1631 Last data filed at 04/24/2022 1015 Gross per 24 hour  Intake 7550.12 ml  Output 900 ml  Net 6650.12 ml        Physical Exam: General: Awake alert appropriate. Constitutional: No distress . Vital signs reviewed. HENT: Conjugate gaze oropharynx moist oxygen by nasal cannula normocephalic.  Atraumatic. Eyes: EOMI. No discharge. Cardiovascular: RRR. No JVD. Respiratory: CTA Bilaterally. Normal effort.  Good air movement W/R/R GI: BS +. Non-distended.  Nontender. Protuberant.  bowel sounds, positive all 4 quadrants.  No rebound and soft. Neurological alert and oriented to person place time situation Musculoskeletal: In wheelchair.  Patient has  diffuse weakness Comments: Limited ROM bilateral shoulder due to stiffness with crepitus observed left elbow with attempts at ROM.  However no pain with PROM of left shoulder.   Skin: Wounds are covered with Tegaderm which was changed yesterday bilateral ankles appear clean.  She continues to have +2 lower extremity edema bilaterally.  Her skin is warm and dry and intact Psychiatric: Appropriate  Vital Signs Blood pressure (!) 113/49, pulse 76, temperature 98 F (36.7 C), temperature source Oral, resp. rate 17, height  (1.6 m), weight (!) 139.3 kg, SpO2 100 %.    Assessment/Plan: 1. Functional deficits which require 3+ hours per day of interdisciplinary therapy in a comprehensive inpatient rehab setting. Physiatrist is providing close team supervision and 24 hour management of active medical problems listed below. Physiatrist and rehab team continue to assess barriers to discharge/monitor patient progress toward functional and medical goals  Care Tool:  Bathing    Body parts bathed by patient: Face, Chest   Body parts bathed by helper: Right arm, Left arm, Buttocks, Front perineal area, Abdomen     Bathing assist Assist Level: 2 Helpers (second helper for rolling, lifting  legs for pericare)     Upper Body Dressing/Undressing Upper body dressing   What is the patient wearing?: Pull over shirt    Upper body assist Assist Level: Moderate Assistance - Patient 50 - 74%    Lower Body Dressing/Undressing Lower body dressing      What is the patient wearing?: Incontinence brief     Lower body assist Assist for lower body dressing: 2 Helpers     Toileting Toileting Toileting Activity did not occur Press photographer and hygiene only):  (incontient)  Toileting assist Assist for toileting: 2 Helpers     Transfers Chair/bed transfer  Transfers assist  Chair/bed transfer activity did not occur: Refused  Chair/bed transfer assist level: Minimal Assistance - Patient >  75%     Locomotion Ambulation   Ambulation assist   Ambulation activity did not occur: Safety/medical concerns  Assist level: 2 helpers Assistive device: Walker-rolling Max distance: 20'   Walk 10 feet activity   Assist  Walk 10 feet activity did not occur: Safety/medical concerns  Assist level: 2 helpers Assistive device: Walker-rolling   Walk 50 feet activity   Assist Walk 50 feet with 2 turns activity did not occur: Safety/medical concerns         Walk 150 feet activity   Assist Walk 150 feet activity did not occur: Safety/medical concerns         Walk 10 feet on uneven surface  activity   Assist Walk 10 feet on uneven surfaces activity did not occur: Safety/medical concerns         Wheelchair     Assist Is the patient using a wheelchair?: Yes Type of Wheelchair: Manual Wheelchair activity did not occur: Refused  Wheelchair assist level: Minimal Assistance - Patient > 75% Max wheelchair distance: 10'    Wheelchair 50 feet with 2 turns activity    Assist    Wheelchair 50 feet with 2 turns activity did not occur: Refused       Wheelchair 150 feet activity     Assist  Wheelchair 150 feet activity did not occur: Refused       Blood pressure (!) 113/49, pulse 76, temperature 98 F (36.7 C), temperature source Oral, resp. rate 17, height 5\' 3"  (1.6 m), weight (!) 139.3 kg, SpO2 100 %.  Medical Problem List and Plan: 1. Functional deficits secondary to debility-for complicated medical stay-CHF exacerbation and bacteremia            - Patient may shower             -ELOS/Goals: 14 to 18 days [facility] PT and OT -5/6 Walk 20 feet yesterday and then a second time 12 feet yesterday with rolling walker.             Set for 4 weeks             -Continue CIR-PT and OT 2.  Antithrombotics: -DVT/anticoagulation: Mechanical: Sequential compression devices,   below-knee bilateral lower extremities            -04/09/2022 Dopplers negative  for DVT's 4/22-patient refusing to use SCDs however educated on need to use them at risk for DVT's             -antiplatelet therapy: N/A due to GIB 3.  Left hip and shoulder pain with myalgias, pain management: Oxycodone as needed for diffuse pain. 04/08/2022-we will schedule tramadol 100 mg every 8 hours not every 6 hours due to renal issues-creatinine 1.31-continue oxycodone for pain as  needed-was taking NSAIDs at home which caused a GI bleed. 04/12/2022-continue tramadol scheduled and oxycodone as needed-04/13/2022-will not go home on oxycodone just tramadol -04/17/2022 patient is well controlled under current pain regimen -4/30 No issues of pain overnight.  5/1- pt pain controlled- will be able to go home on Tramadol- not Oxy- she's having more pain due to exercise it appears,    5/2- adding Duloxetine 30 mg nightly for nerve pain- explained will take a few days to kick in - will increase next week, or can be increased as of 04/23/22 at the earliest.  5/3- no side effects- but slept better- change to 60 mg on 5/5  5/5 Duloxetine dose increased to 60mg  daily 5/6 patient reports pain is well managed today continue current regimen. 4. Mood: LCSW to follow for evaluation and support. - Antipsychotic agents: Not applicable 5. Neuropsych: This patient is capable of making decisions on her own behalf. 6. Skin/Wound Care: Pressure relief measures. - Monitor for recurrent peripheral edema with increase in activity. Modify RF as indicated.    -04/08/2022 on low air loss mattress: Continue that along with 45M Tegaderm for ankles bilaterally per WOC    -04/12/2022 understands that she will have to remain on direct mattress     -Request that pressure is High    -04/15/22 switch to Kreg bed.    -04/16/2022-much improved pain with ROHO in chair and Kreg bed for coccyx pain    -04/17/2022 has adjusted to Kreg bed she continues to have 45M Tegaderm for ankles per WOC -5/6 wound is care and skin appear to be doing  well.  Continue the 45M Tegaderm on ankles. 7. Fluids/Electrolytes/Nutrition: Routine in and outs with follow-up chemistries   -4/29 Continue to monitor chemistries. 5/1- CO2 up to 35- will need to likely wean O2 as tolerated             5/3- will check labs in AM to see where CO2 is.              5/4 Co2 31, continue current treatment 5/6 last CO2 is at 33 slightly slightly up from yesterday's value of 3.1 we will continue to monitor while looking at weaning oxygen as tolerated.  BUN and creatinine are slightly elevated.  We will continue to trend labs.     Component Value Date/Time   NA 141 04/23/2022 0427   K 4.2 04/23/2022 0427   CL 101 04/23/2022 0427   CO2 33 (H) 04/23/2022 0427   GLUCOSE 102 (H) 04/23/2022 0427   BUN 24 (H) 04/23/2022 0427   CREATININE 1.08 (H) 04/23/2022 0427   CALCIUM 9.3 04/23/2022 0427   GFRNONAA 54 (L) 04/23/2022 0427    8.  Enterococcus fecalis bacteremia: On ampicillin and ceftriaxone with end date of 04/25/22 -04/17/2022 continue ampicillin and ceftriaxone -04/18/22 Due to issues with PICC line overnight, pharmacy had to retime doses of both antibiotics. 9.  ABLA due to UGIB: ON PPI BID.  We will add iron supplement with MiraLAX daily.   -Check anemia panel is question vitamin B12 and iron deficiency.    -4/20-has iron deficiency    -4/21-on IV iron as well as PO    -4/24 Hgb up to 8.2    -Encouraged appropriate diet, with supplements as above.     -4/29 Hgb stable at 9.0, continue to monitor     -4/30 qMonday labs tomorrow       5/1- Hb 9.1- stable      5/5 HBG  9.6- stable, monitor      5/6 monitor with qMonday labs   CBC    Component Value Date/Time   WBC 7.5 04/23/2022 0427   RBC 3.53 (L) 04/23/2022 0427   HGB 9.6 (L) 04/23/2022 0427   HCT 31.5 (L) 04/23/2022 0427   PLT 273 04/23/2022 0427   MCV 89.2 04/23/2022 0427   MCH 27.2 04/23/2022 0427   MCHC 30.5 04/23/2022 0427   RDW 18.2 (H) 04/23/2022 0427   LYMPHSABS 1.0 04/23/2022 0427    MONOABS 0.6 04/23/2022 0427   EOSABS 0.5 04/23/2022 0427   BASOSABS 0.1 04/23/2022 0427   10. CAF: Monitor HR TID-Continue metoprolol. 4/21-rate controlled-continue regimen 4/29 HR stable, in the 70's 5/4 HR in 80s today, monitor 5/6 heart rate stable in the 70s today continue to monitor.      04/24/2022    1:44 PM 04/24/2022   12:47 PM 04/24/2022    7:00 AM  Vitals with BMI  Weight   307 lbs 2 oz  BMI   54.41  Systolic 113 152   Diastolic 49 59   Pulse 76 78     11. Chronic diastolic CHF/fluid overload: Heart Healthy diet. Strict I/O.   --2,000 cc (IV + PO) FR-will change to 1,000 cc po as getting IV every 4 hours. --Monitor for orthostatic changes (due to prior history) --Continue Lasix, metoprolol, and spironolactone. -4/27 weight very off-67 kg incorrect, nursing requested to fix. 4/28 weight very inconsistent, may be due to technique, will see if can get more stable. -4/29 Wt 148.1 kg, continue to monitor daily weights. 5/3- Weight 145.8 kg- stable             5/5- Wt 146.6 overall stable, continue to monitor 5/6 weight today is 139.3 kg which is considerably down from last weight on 54146.6 kg.      Filed Weights    Filed Weights   04/21/22 0500 04/22/22 0433 04/24/22 0700  Weight: (!) 145.8 kg (!) 146.6 kg (!) 139.3 kg    12.  Restless restless leg syndrome: Continue with Requip 2 mg twice daily and 3 mg at bedtime. 13.  OSA/OHS: Hypercarbia noted but improved from 43-35. - Encourage BiPAP use as able to tolerate it for 1 to 3 hours throughout the night.   - Check ABG for baseline/BiPAP justification if needed at discharge   - Wean oxygen as tolerated.  Encourage pulmonary hygiene.   - 04/08/2022-patient having husband bring it from    -04/12/22 on 2 L O2 nasal cannula currently    -04/15/2022-home setting and social work notes-we will let our team know    -04/16/22-let patient know as well    -04/17/2022 patient reports doing CPAP overnight however having some issues  with claustrophobia and being able to take the mask on and off due to limited range of motion in left arm.  -4/30 Pt refused CPAP overnight.  5/3- wore CPAP almost all night- doing better 5/6 patient reports no issues with CPAP. 14.  Morbid obesity: BMI 60.77.  Educate on appropriate diet to promote mobility and health.    -04/08/2022-BMI down slightly to 56.15 but could be due to today change    -04/10/2022 BMI 53-due to the change in bed.     -04/13/2022-BMI 54.21     -04/15/2022-BMI in computer is 26 asking nursing to fax     -04/17/2022-Wt 148.1 kg, continue to monitor daily weights. 15.  Peripheral edema: Greatly improved due to elevation and limited mobility. -  Educated on importance of low-salt diet and elevation when seated. Continue protein supplement for likely low protein stores. Continue compression stockings or Ace wrap to prevent worsening of edema. 04/08/2022 edema looks better this morning 2+ edema continue teds -04/17/2022 still has 1+1 to +2 edema continue measures as outlined above.  Patient also encouraged to have high-protein diet. -4/30 +1-2 pedal edema today 5/1 stable LE edema. 5/6 +1 mostly LE edema patient even notices that she feels like she has less swelling.  Edema is nonpitting. 16.  Wounds 04/08/2022-on low air loss mattress-patient said not as big as 1 upstairs-explained to have a different hospital and already on bariatric low-air-loss mattress that we have. -04/17/2022 K reticulocyte bed also seeing wound care consult for care bilateral lower extremity wounds 6M Tegaderm -4/30 continue above treatment 17.  Bilateral lower extremity wounds-venous stasis ulcers-not pressure       -04/09/22-continue 6M Tegaderm-can be left on for 21 to 28 days per wound ostomy care nurse       -04/12/2022 continue air mattress see discussion above       -04/17/2022 continue both air mattress and 6M Tegaderm       -4/30 Continue Tegaderm and air mattress 18.  Urinary and bowel  incontinence -04/11/2022 better with female urine normal continue current regimen -4/49/2023 patient states female urinal is working well for her 4/30 Continue to use female urinal 19. PICC line unable to flush or infuse -4/29 Received call from bedside nurse Rella Larve that PICC line was not working. -IV team was consulted and they were also unable to achieve use of line. This line was not placed here at [facility] but at [facility]. -04/18/22 IV team and Radiology recommended to have 1D-CXR to confirm placement.  Since there was still concern about possible "kink" of line in arm, IV-team recommended to have also 2D CXR.   -2D CXR showed placement in distal SVC vs mid SVC in 1D view.  Due to difference, PICC line RN was consulted to confirm that the placement was correct and line could be used. - Per PICC line RN line may be used for duration of antibiotic time course through 04/25/22.  Since the line is abutting the vein, there may continue to be intermittent episodes of the line being difficult to infuse. -Per PICC RN, since line was not placed at [facility], the line may only be removed but not exchanged. -Pt also has a PIV which may be a good idea to keep to ensure antibiotics can still be administered even if there are PICC line issues persist. 19. Azotemia Cr 1.14 5/4 -Will decrease lasix to 40mg   -5/5 scr down to 1.08, continue current treatment -5/6 continue to monitor and trend with q. Monday labs.    LOS: 17 days A FACE TO FACE EVALUATION WAS PERFORMED  Tressia Miners, FNP 04/24/2022, 4:31 PM

## 2022-04-24 NOTE — Progress Notes (Signed)
Occupational Therapy Session Note ? ?Patient Details  ?Name: Stacy Hanson ?MRN: [id number] ?Date of Birth: 19-Apr-1951 ? ?Today's Date: 04/24/2022 ?OT Individual Time: 1194-1740 ?OT Individual Time Calculation (min): 60 min  ? ? ?Short Term Goals: ?Week 1:  OT Short Term Goal 1 (Week 1): Pt will be able to dress UB with mod A at the EOB ?OT Short Term Goal 1 - Progress (Week 1): Progressing toward goal ?OT Short Term Goal 2 (Week 1): Pt will toleate sitting out of the bed for 2 hrs at a time ?OT Short Term Goal 2 - Progress (Week 1): Met ?OT Short Term Goal 3 (Week 1): Pt will be able to perform 2 grooming tasks with mod A ?OT Short Term Goal 3 - Progress (Week 1): Progressing toward goal ?OT Short Term Goal 4 (Week 1): Pt will transition to EOB in hospital bed with max A +1 ?OT Short Term Goal 4 - Progress (Week 1): Not met ? ?Skilled Therapeutic Interventions/Progress Updates: Upon initial approach for OT, patient initially refused due to complaints of dizzyness "I think it is tne new medicine."  She continued to participate as she asked for assistance to use the female urinal and required maximal asssistance for placement without spillage.  She concurred to continue working for UE ROM and active use after she stated that her UEs "cannot much be used due to how they pulled me off the floor."  Patient concurred to work on techniques for bed mobility as x2 during the session, she had slid down in bed.  Other wise, she was able to complete bilateral UE  PROM, AAROM and resistance exercises in order increase reach and independence.   With increased ROM and exercises as well as bed mobility patient was able to reach greater distances from her bed to retrieve items on her tray.  With tray setup paitnet was able to self feed.   At end of session, patient was left with her nurse who was attending to patient request to discuss her new medication.   Patient stated she suspected it was causing her new onset of  dizzyiness. ? ?Continue OT POC ?   ? ?Therapy Documentation ?Precautions:  ?Precautions ?Precautions: Fall ?Restrictions ?Weight Bearing Restrictions: No ?General: ? Pain:denied ?  ? ?Therapy/Group: Individual Therapy ? ?Herschell Dimes ?04/24/2022, 4:21 PM ?

## 2022-04-24 NOTE — Progress Notes (Signed)
Pt reports dizziness. Vitals obtained, CBG obtained result-80. Secured chat MD Riley Kill. ? 04/24/22 1247  ?Vitals  ?BP (!) 152/59  ?Pulse Rate 78  ?Oxygen Therapy  ?SpO2 96 %  ?O2 Device Nasal Cannula  ?O2 Flow Rate (L/min) 2 L/min  ? ?Mylo Red, LPN  ?

## 2022-04-25 DIAGNOSIS — R5381 Other malaise: Secondary | ICD-10-CM | POA: Diagnosis not present

## 2022-04-25 MED ORDER — CHLORHEXIDINE GLUCONATE CLOTH 2 % EX PADS
6.0000 | MEDICATED_PAD | Freq: Two times a day (BID) | CUTANEOUS | Status: DC
  Administered 2022-04-25 – 2022-05-01 (×11): 6 via TOPICAL

## 2022-04-25 NOTE — Progress Notes (Signed)
Occupational Therapy Session Note ? ?Patient Details  ?Name: Stacy Hanson ?MRN: [id number] ?Date of Birth: 1951/04/15 ? ?Today's Date: 04/25/2022 ?OT Individual Time: 0900-1000 ?OT Individual Time Calculation (min): 60 min  ? ? ?Short Term Goals: ?Week 3:  OT Short Term Goal 1 (Week 3): Pt will perform BSC tranfser with min A stand pivot with RW ?OT Short Term Goal 2 (Week 3): Pt will maintain standing balance at sink with min A for grooming tasks ?OT Short Term Goal 3 (Week 3): Pt will perform UB dressing tasks with min A ? ?Skilled Therapeutic Interventions/Progress Updates:  ?  Pt resting in bed upon arrival. OT intervention with focus on bed mobility and BUE PROM/AAROM/AROM at bed level with HOB elevated to increase AROM/strength of BUE and assist with BADLs. Kinesio Tape reapplied to pt LUE (deltoids and shoulder). Pt reports that she "thinks" the tape makes a difference. Pt with increased LUE AROM/AAROM and able to reach to L side of head with gravity reduced. Pt noted with improved form and less shoulder "hiking" with LUE functional reaching to simulate washing face and brushing hair. Pt remained in bed with all needs within reach.  ? ?Therapy Documentation ?Precautions:  ?Precautions ?Precautions: Fall ?Restrictions ?Weight Bearing Restrictions: No ? ?Pain: ?Pt c/o LUE soreness; repositioned and PROM/stretching ? ?Therapy/Group: Individual Therapy ? ?Rich Brave ?04/25/2022, 10:04 AM ?

## 2022-04-25 NOTE — Progress Notes (Signed)
Physical Therapy Session Note ? ?Patient Details  ?Name: Stacy Hanson ?MRN: [id number] ?Date of Birth: [birth year] ? ?Today's Date: 04/25/2022 ?PT Individual Time: 3212-2482 ?PT Individual Time Calculation (min): 52 min  ? ?Short Term Goals: ?Week 3:  PT Short Term Goal 1 (Week 3): Pt will perform bed mobility with assist x 1 consistently ?PT Short Term Goal 2 (Week 3): Pt will ambulate x 50 ft with LRAD and min A ?PT Short Term Goal 3 (Week 3): Pt will tolerate standing x 5 min while performing functional task ?PT Short Term Goal 4 (Week 3): Pt will perform sit to stand with min A x 1 consistently ? ?Skilled Therapeutic Interventions/Progress Updates:  ?  Pt received seated in bed at 11:08. Pt missed 8 min of scheduled therapy session due to this therapist being caught up during previously scheduled session. Pt agreeable to therapy session. Pt reports ongoing nerve pain in her BLE described as "burning" as well as B shoulder pain. Pt reports being premedicated prior to start of therapy session and declines further intervention. Pt reports urgent need to urinate, encouraged her to transfer to [facility] but pt declines due to urgency and requests to use urinal at bed level. Pt is total A for placement of urinal, able to continently void. Pt is dependent for pericare and placement of brief. Seated in bed to sitting EOB with CGA with HOB maximally elevated, increased time needed to complete transfer. Sit to stand with min A to RW initially increasing to mod A to RW as session progresses and fatigue level increases. Stand pivot transfer to w/c with RW and min A. Dependent transport via w/c to/from therapy gym for time and energy conservation. Ambulation x 25 ft with RW and CGA for balance with close w/c follow from a 2nd person for safety. Pt continues to exhibit increased endurance for gait training with increased distance covered from previous session. Pt also exhibits improved LLE clearance during gait and improved upright  posture, able to maintain grasp with RUE on RW without reliance on R forearm support on RW in standing. Pt requesting to weigh herself again. Attempt to step up onto scale frame standing position with RW, pt able to lift RLE and RW up onto scale platform, unable to lift LLE up safely. Stand pivot transfer w/c to bed with RW and CGA. Sit to stand to scale from elevated bed with min A x 2 for safety. Pt weighs 309.9 lbs this date. Sit to supine assist x 2 for BLE management and trunk control. Pt left seated in bed with needs in reach at end of session. Pt on 1L O2 throughout session, SpO2 drops to 90% with activity, returns to 93% (+) with seated rest break and pursed lip breathing techniques. ? ?Therapy Documentation ?Precautions:  ?Precautions ?Precautions: Fall ?Restrictions ?Weight Bearing Restrictions: No ?General: ?PT Amount of Missed Time (min): 8 Minutes ?PT Missed Treatment Reason: Other (Comment) (therapist with previous patient) ? ? ? ? ? ?Therapy/Group: Individual Therapy ? ? ?Peter Congo, PT, DPT, CSRS ? ?04/25/2022, 12:56 PM  ?

## 2022-04-25 NOTE — Progress Notes (Signed)
PT wearing home Cpap. No resp distress noted.  ?

## 2022-04-25 NOTE — Progress Notes (Signed)
PROGRESS NOTE   Subjective/Complaints: Patient seen today in bed.  Reports that she had a rough day yesterday with an episode of dizziness early afternoon lasting ~ 1 hr.  Possibly could be a SE from her Cymbalta? After this event no further episodes.  Reports some difficulty with staying on CPAP since is make her mouth dry.   ROS: Patient denies shortness of breath abdominal pain chest pain nausea vomiting constipation diarrhea or visual changes. Positive for dizziness.   Objective:   No results found. Recent Labs    04/23/22 0427  WBC 7.5  HGB 9.6*  HCT 31.5*  PLT 273    Recent Labs    04/23/22 0427  NA 141  K 4.2  CL 101  CO2 33*  GLUCOSE 102*  BUN 24*  CREATININE 1.08*  CALCIUM 9.3    Intake/Output Summary (Last 24 hours) at 04/25/2022 1438 Last data filed at 04/25/2022 0700 Gross per 24 hour  Intake 999.17 ml  Output 600 ml  Net 399.17 ml        Physical Exam: General: Awake alert appropriate. Constitutional: No distress . Vital signs reviewed. HENT: Conjugate gaze oropharynx moist oxygen by nasal cannula normocephalic.  Atraumatic. Eyes: EOMI. No discharge. Cardiovascular: RRR. No JVD. Respiratory: CTA Bilaterally. Normal effort.  Good air movement W/R/R GI: BS +. Non-distended.  Nontender. Protuberant.  bowel sounds, positive all 4 quadrants.  No rebound and soft. Neurological alert and oriented to person place time situation + Dizziness yesterday, which appear resolved today. Musculoskeletal: In wheelchair.  Patient has diffuse weakness Comments: Limited ROM bilateral shoulder due to stiffness with crepitus observed left elbow with attempts at ROM.  However no pain with PROM of left shoulder.   Skin:Wounds are covered with Tegaderm which was changed yesterday bilateral ankles appear clean.  +1 lower extremity edema bilaterally.  Her skin is warm and dry and intact Psychiatric: Appropriate  Vital  Signs Blood pressure (!) 143/66, pulse 76, temperature 97.6 F (36.4 C), temperature source Oral, resp. rate 17, height 5\' 3"  (1.6 m), weight (!) 145.8 kg, SpO2 100 %.    Assessment/Plan: 1. Functional deficits which require 3+ hours per day of interdisciplinary therapy in a comprehensive inpatient rehab setting. Physiatrist is providing close team supervision and 24 hour management of active medical problems listed below. Physiatrist and rehab team continue to assess barriers to discharge/monitor patient progress toward functional and medical goals  Care Tool:  Bathing    Body parts bathed by patient: Face, Chest   Body parts bathed by helper: Right arm, Left arm, Buttocks, Front perineal area, Abdomen     Bathing assist Assist Level: 2 Helpers (second helper for rolling, lifting legs for pericare)     Upper Body Dressing/Undressing Upper body dressing   What is the patient wearing?: Pull over shirt    Upper body assist Assist Level: Moderate Assistance - Patient 50 - 74%    Lower Body Dressing/Undressing Lower body dressing      What is the patient wearing?: Incontinence brief     Lower body assist Assist for lower body dressing: 2 Helpers     Toileting Toileting Toileting Activity did not occur Recruitment consultant  management and hygiene only):  (incontient)  Toileting assist Assist for toileting: 2 Helpers     Transfers Chair/bed transfer  Transfers assist  Chair/bed transfer activity did not occur: Refused  Chair/bed transfer assist level: Minimal Assistance - Patient > 75%     Locomotion Ambulation   Ambulation assist   Ambulation activity did not occur: Safety/medical concerns  Assist level: 2 helpers Assistive device: Walker-rolling Max distance: 25'   Walk 10 feet activity   Assist  Walk 10 feet activity did not occur: Safety/medical concerns  Assist level: 2 helpers Assistive device: Walker-rolling   Walk 50 feet activity   Assist Walk 50  feet with 2 turns activity did not occur: Safety/medical concerns         Walk 150 feet activity   Assist Walk 150 feet activity did not occur: Safety/medical concerns         Walk 10 feet on uneven surface  activity   Assist Walk 10 feet on uneven surfaces activity did not occur: Safety/medical concerns         Wheelchair     Assist Is the patient using a wheelchair?: Yes Type of Wheelchair: Manual Wheelchair activity did not occur: Refused  Wheelchair assist level: Minimal Assistance - Patient > 75% Max wheelchair distance: 10'    Wheelchair 50 feet with 2 turns activity    Assist    Wheelchair 50 feet with 2 turns activity did not occur: Refused       Wheelchair 150 feet activity     Assist  Wheelchair 150 feet activity did not occur: Refused       Blood pressure (!) 143/66, pulse 76, temperature 97.6 F (36.4 C), temperature source Oral, resp. rate 17, height 5\' 3"  (1.6 m), weight (!) 145.8 kg, SpO2 100 %.  Medical Problem List and Plan: 1. Functional deficits secondary to debility-for complicated medical stay-CHF exacerbation and bacteremia            - Patient may shower             -ELOS/Goals: 14 to 18 days [facility] PT and OT -5/6 Walk 20 feet yesterday and then a second time 12 feet yesterday with rolling walker. -5/7 Dizziness yesterday, didn't walk today yet.             Set for 4 weeks             -Continue CIR-PT and OT 2.  Antithrombotics: -DVT/anticoagulation: Mechanical: Sequential compression devices,   below-knee bilateral lower extremities            -04/09/2022 Dopplers negative for DVT's 4/22-patient refusing to use SCDs however educated on need to use them at risk for DVT's             -antiplatelet therapy: N/A due to GIB 3.  Left hip and shoulder pain with myalgias, pain management: Oxycodone as needed for diffuse pain. 04/08/2022-we will schedule tramadol 100 mg every 8 hours not every 6 hours due to renal  issues-creatinine 1.31-continue oxycodone for pain as needed-was taking NSAIDs at home which caused a GI bleed. 04/12/2022-continue tramadol scheduled and oxycodone as needed-04/13/2022-will not go home on oxycodone just tramadol -04/17/2022 patient is well controlled under current pain regimen -4/30 No issues of pain overnight.  5/1- pt pain controlled- will be able to go home on Tramadol- not Oxy- she's having more pain due to exercise it appears,    5/2- adding Duloxetine 30 mg nightly for nerve pain- explained will take  a few days to kick in - will increase next week, or can be increased as of 04/23/22 at the earliest.  5/3- no side effects- but slept better- change to 60 mg on 5/5  5/5 Duloxetine dose increased to 60mg  daily 5/6 patient reports pain is well managed today continue current regimen. 5/7 Dr. Riley Kill decreased Duloxetine dose to 30 mg since patient had episode of dizziness yesterday, tolerating new dose well so far. 4. Mood: LCSW to follow for evaluation and support. - Antipsychotic agents: Not applicable 5. Neuropsych: This patient is capable of making decisions on her own behalf. 6. Skin/Wound Care: Pressure relief measures. - Monitor for recurrent peripheral edema with increase in activity. Modify RF as indicated.    -04/08/2022 on low air loss mattress: Continue that along with 74M Tegaderm for ankles bilaterally per WOC    -04/12/2022 understands that she will have to remain on direct mattress     -Request that pressure is High    -04/15/22 switch to Kreg bed.    -04/16/2022-much improved pain with ROHO in chair and Kreg bed for coccyx pain    -04/17/2022 has adjusted to Kreg bed she continues to have 74M Tegaderm for ankles per WOC -5/6 wound is care and skin appear to be doing well.  Continue the 74M Tegaderm on ankles. 5/7 Ankle/leg swelling continues to improve. 7. Fluids/Electrolytes/Nutrition: Routine in and outs with follow-up chemistries   -4/29 Continue to monitor  chemistries. 5/1- CO2 up to 35- will need to likely wean O2 as tolerated             5/3- will check labs in AM to see where CO2 is.              5/4 Co2 31, continue current treatment 5/6 last CO2 is at 33 slightly slightly up from yesterday's value of 3.1 we will continue to monitor while looking at weaning oxygen as tolerated.  BUN and creatinine are slightly elevated.  We will continue to trend labs.     Component Value Date/Time   NA 141 04/23/2022 0427   K 4.2 04/23/2022 0427   CL 101 04/23/2022 0427   CO2 33 (H) 04/23/2022 0427   GLUCOSE 102 (H) 04/23/2022 0427   BUN 24 (H) 04/23/2022 0427   CREATININE 1.08 (H) 04/23/2022 0427   CALCIUM 9.3 04/23/2022 0427   GFRNONAA 54 (L) 04/23/2022 0427    8.  Enterococcus fecalis bacteremia: On ampicillin and ceftriaxone with end date of 04/25/22 -04/17/2022 continue ampicillin and ceftriaxone -04/18/22 Due to issues with PICC line overnight, pharmacy had to retime doses of both antibiotics. 5/7 Antibiotics to finish today, PICC will need to be discontinued prior to discharge. 9.  ABLA due to UGIB: ON PPI BID.  We will add iron supplement with MiraLAX daily.   -Check anemia panel is question vitamin B12 and iron deficiency.    -4/20-has iron deficiency    -4/21-on IV iron as well as PO    -4/24 Hgb up to 8.2    -Encouraged appropriate diet, with supplements as above.     -4/29 Hgb stable at 9.0, continue to monitor     -4/30 qMonday labs tomorrow       5/1- Hb 9.1- stable      5/5 HBG 9.6- stable, monitor      5/6 monitor with qMonday labs   CBC    Component Value Date/Time   WBC 7.5 04/23/2022 0427   RBC 3.53 (L) 04/23/2022 0427  HGB 9.6 (L) 04/23/2022 0427   HCT 31.5 (L) 04/23/2022 0427   PLT 273 04/23/2022 0427   MCV 89.2 04/23/2022 0427   MCH 27.2 04/23/2022 0427   MCHC 30.5 04/23/2022 0427   RDW 18.2 (H) 04/23/2022 0427   LYMPHSABS 1.0 04/23/2022 0427   MONOABS 0.6 04/23/2022 0427   EOSABS 0.5 04/23/2022 0427   BASOSABS  0.1 04/23/2022 0427   10. CAF: Monitor HR TID-Continue metoprolol. 4/21-rate controlled-continue regimen 4/29 HR stable, in the 70's 5/4 HR in 80s today, monitor 5/6 heart rate stable in the 70s today continue to monitor. 5/7 heart rate stable in the 60s today continue to monitor.      04/25/2022    1:25 PM 04/25/2022    5:00 AM 04/25/2022    4:24 AM  Vitals with BMI  Weight  321 lbs 7 oz   BMI  56.95   Systolic 143  156  Diastolic 66  81  Pulse 76  78    11. Chronic diastolic CHF/fluid overload: Heart Healthy diet. Strict I/O.   --2,000 cc (IV + PO) FR-will change to 1,000 cc po as getting IV every 4 hours. --Monitor for orthostatic changes (due to prior history) --Continue Lasix, metoprolol, and spironolactone. -4/27 weight very off-67 kg incorrect, nursing requested to fix. 4/28 weight very inconsistent, may be due to technique, will see if can get more stable. -4/29 Wt 148.1 kg, continue to monitor daily weights. 5/3- Weight 145.8 kg- stable             5/5- Wt 146.6 overall stable, continue to monitor 5/6 weight today is 139.3 kg which is considerably down from last weight on 54146.6 kg. 5/7 Pedal and ankle edema continues to decrease now, 0-+1.      Filed Weights    Filed Weights   04/22/22 0433 04/24/22 0700 04/25/22 0500  Weight: (!) 146.6 kg (!) 139.3 kg (!) 145.8 kg    12.  Restless restless leg syndrome: Continue with Requip 2 mg twice daily and 3 mg at bedtime. 13.  OSA/OHS: Hypercarbia noted but improved from 43-35. - Encourage BiPAP use as able to tolerate it for 1 to 3 hours throughout the night.   - Check ABG for baseline/BiPAP justification if needed at discharge   - Wean oxygen as tolerated.  Encourage pulmonary hygiene.   - 04/08/2022-patient having husband bring it from    -04/12/22 on 2 L O2 nasal cannula currently    -04/15/2022-home setting and social work notes-we will let our team know    -04/16/22-let patient know as well    -04/17/2022 patient reports  doing CPAP overnight however having some issues with claustrophobia and being able to take the mask on and off due to limited range of motion in left arm.  -4/30 Pt refused CPAP overnight.  5/3- wore CPAP almost all night- doing better 5/6 patient adjusting to CPAP. 5/7 Reports mouth and face dryness with CPAP which presents a challenge to use. 14.  Morbid obesity: BMI 60.77.  Educate on appropriate diet to promote mobility and health.    -04/08/2022-BMI down slightly to 56.15 but could be due to today change    -04/10/2022 BMI 53-due to the change in bed.     -04/13/2022-BMI 54.21     -04/15/2022-BMI in computer is 26 asking nursing to fax     -04/17/2022-Wt 148.1 kg, continue to monitor daily weights. 15.  Peripheral edema: Greatly improved due to elevation and limited mobility. - Educated on importance  of low-salt diet and elevation when seated. Continue protein supplement for likely low protein stores. Continue compression stockings or Ace wrap to prevent worsening of edema. 04/08/2022 edema looks better this morning 2+ edema continue teds -04/17/2022 still has 1+1 to +2 edema continue measures as outlined above.  Patient also encouraged to have high-protein diet. -4/30 +1-2 pedal edema today 5/1 stable LE edema. 5/6 +1 mostly LE edema patient even notices that she feels like she has less swelling.  Edema is nonpitting. 5/7 0-+1 edema pedal and ankle, greatly improved. 16.  Wounds 04/08/2022-on low air loss mattress-patient said not as big as 1 upstairs-explained to have a different hospital and already on bariatric low-air-loss mattress that we have. -04/17/2022 K reticulocyte bed also seeing wound care consult for care bilateral lower extremity wounds 52M Tegaderm -4/30 continue above treatment 17.  Bilateral lower extremity wounds-venous stasis ulcers-not pressure       -04/09/22-continue 52M Tegaderm-can be left on for 21 to 28 days per wound ostomy care nurse       -04/12/2022 continue air  mattress see discussion above       -04/17/2022 continue both air mattress and 52M Tegaderm       -4/30 Continue Tegaderm and air mattress 18.  Urinary and bowel incontinence -04/11/2022 better with female urine normal continue current regimen -4/49/2023 patient states female urinal is working well for her 4/30 Continue to use female urinal 19. PICC line unable to flush or infuse -4/29 Received call from bedside nurse Rella Larve that PICC line was not working. -IV team was consulted and they were also unable to achieve use of line. This line was not placed here at [facility] but at [facility]. -04/18/22 IV team and Radiology recommended to have 1D-CXR to confirm placement.  Since there was still concern about possible "kink" of line in arm, IV-team recommended to have also 2D CXR.   -2D CXR showed placement in distal SVC vs mid SVC in 1D view.  Due to difference, PICC line RN was consulted to confirm that the placement was correct and line could be used. - Per PICC line RN line may be used for duration of antibiotic time course through 04/25/22.  Since the line is abutting the vein, there may continue to be intermittent episodes of the line being difficult to infuse. -Per PICC RN, since line was not placed at [facility], the line may only be removed but not exchanged. -Pt also has a PIV which may be a good idea to keep to ensure antibiotics can still be administered even if there are PICC line issues persist. 19. Azotemia Cr 1.14 5/4 -Will decrease lasix to   -5/5 scr down to 1.08, continue current treatment -5/6 continue to monitor and trend with q. Monday labs.   LOS: 18 days A FACE TO FACE EVALUATION WAS PERFORMED  Tressia Miners, FNP 04/25/2022, 2:38 PM

## 2022-04-25 NOTE — Progress Notes (Signed)
Pt placed self on home CPAP. 

## 2022-04-26 LAB — BASIC METABOLIC PANEL
Anion gap: 7 (ref 5–15)
BUN: 20 mg/dL (ref 8–23)
CO2: 33 mmol/L — ABNORMAL HIGH (ref 22–32)
Calcium: 9.2 mg/dL (ref 8.9–10.3)
Chloride: 100 mmol/L (ref 98–111)
Creatinine, Ser: 0.99 mg/dL (ref 0.44–1.00)
GFR, Estimated: >60 mL/min (ref >60)
Glucose, Bld: 107 mg/dL — ABNORMAL HIGH (ref 70–99)
Potassium: 3.9 mmol/L (ref 3.5–5.1)
Sodium: 140 mmol/L (ref 135–145)

## 2022-04-26 LAB — GLUCOSE, CAPILLARY: Glucose-Capillary: 80 mg/dL (ref 70–99)

## 2022-04-26 MED ORDER — SACCHAROMYCES BOULARDII 250 MG PO CAPS: 250.0000 mg | ORAL_CAPSULE | Freq: Two times a day (BID) | ORAL | Status: AC

## 2022-04-26 NOTE — Progress Notes (Signed)
Physical Therapy Session Note ? ?Patient Details  ?Name: Stacy Hanson ?MRN: [id number] ?Date of Birth: May 01, 1951 ? ?Today's Date: 04/26/2022 ?PT Individual Time: 1100-1200 ?PT Individual Time Calculation (min): 60 min  ? ?Short Term Goals: ?Week 3:  PT Short Term Goal 1 (Week 3): Pt will perform bed mobility with assist x 1 consistently ?PT Short Term Goal 2 (Week 3): Pt will ambulate x 50 ft with LRAD and min A ?PT Short Term Goal 3 (Week 3): Pt will tolerate standing x 5 min while performing functional task ?PT Short Term Goal 4 (Week 3): Pt will perform sit to stand with min A x 1 consistently ? ?Skilled Therapeutic Interventions/Progress Updates:  ?  Pt received seated in bed, agreeable to PT session. Pt reports her usual aches and pains, utilized repositioning and rest breaks as needed during session for pain management. Pt reports urgent need to toilet, requesting to use BSC. Seated in bed to sitting EOB with CGA, increased time needed for LLE management. Sit to stand with min A to RW. Stand pivot transfer to [facility] with RW and CGA. Pt able to continently void while seated on BSC. Pt has some trouble standing from [facility] due to it being standard size and not bariatric size, mod A to stand with assist needed to stabilize BSC. Pt is dependent for pericare in standing and dependent to don new brief. Pt is min A to doff gown and don clean gown while seated in w/c. Dependent transport via w/c to/from therapy gym for time and energy conservation. Sit to stand with mod A to RW from low w/c seat. Ambulation x 30 ft, x 14 ft with RW and CGA for balance with close w/c follow from a 2nd person for safety. Pt exhibits improved LLE clearance during gait and decreased reliance on RUE forearm support on RW. Pt on 1L O2 via [place] throughout session, SpO2 remains at 91% and higher with activity. Pt agreeable to remain seated in w/c in room at end of session, needs in reach. ? ?Therapy Documentation ?Precautions:   ?Precautions ?Precautions: Fall ?Restrictions ?Weight Bearing Restrictions: No ? ? ? ? ? ? ?Therapy/Group: Individual Therapy ? ? ?Peter Congo, PT, DPT, CSRS ?04/26/2022, 12:24 PM  ?

## 2022-04-26 NOTE — Progress Notes (Signed)
Pt self administers home CPAP. 

## 2022-04-26 NOTE — Progress Notes (Signed)
Occupational Therapy Session Note ? ?Patient Details  ?Name: Stacy Hanson ?MRN: [id number] ?Date of Birth: 02-01-1951 ? ?Today's Date: 04/26/2022 ?OT Individual Time: [id number] ?OT Individual Time Calculation (min): 70 min  ? ? ?Short Term Goals: ?Week 3:  OT Short Term Goal 1 (Week 3): Pt will perform BSC tranfser with min A stand pivot with RW ?OT Short Term Goal 2 (Week 3): Pt will maintain standing balance at sink with min A for grooming tasks ?OT Short Term Goal 3 (Week 3): Pt will perform UB dressing tasks with min A ? ?Skilled Therapeutic Interventions/Progress Updates:  ?  Pt resting in bed upon arrival. Pain as noted below but agreeable to continued therapy for UE strengthening and funciton. BUE PROM/AAROM/AROM for following exercises: ?Shoulder flexion, shoulder extension, shoulder abduction, shoulder external rotation, elbow flexion/extension ? ?Pt with improved PROM/AAROM in all aspects.  ? ?Pt pleased with progress.  Pt remained in bed with all needs within reach.  ? ?Therapy Documentation ?Precautions:  ?Precautions ?Precautions: Fall ?Restrictions ?Weight Bearing Restrictions: No ? ?Pain: ?Pt reports BUE (shoulder and upper arm) soreness from previous days activities; repositioned and K-pad ordered by PA ? ?Therapy/Group: Individual Therapy ? ?Leroy Libman ?04/26/2022, 10:28 AM ?

## 2022-04-26 NOTE — Progress Notes (Signed)
Occupational Therapy Session Note ? ?Patient Details  ?Name: Stacy Hanson ?MRN: [id number] ?Date of Birth: 1951-08-23 ? ?Today's Date: 04/26/2022 ?OT Individual Time: [id number] ?OT Individual Time Calculation (min): 55 min  ? ? ?Short Term Goals: ?Week 3:  OT Short Term Goal 1 (Week 3): Pt will perform BSC tranfser with min A stand pivot with RW ?OT Short Term Goal 2 (Week 3): Pt will maintain standing balance at sink with min A for grooming tasks ?OT Short Term Goal 3 (Week 3): Pt will perform UB dressing tasks with min A ? ?Skilled Therapeutic Interventions/Progress Updates:  ?  Pt sitting in w/c upon arrival with husband present. Pt requested to return to bed (pain as noted below). Sit>stand from w/c with min A.. Stand pivot transfer with RW to EOB with min A. 4" step provided for pt to place BLE after seated EOB. Pt used step to provide leverage for reciprocal scooting back onto bed. Sit>supine with max A. Pt able to reposition in bed using bed rails to assist with pulling up in bed.  ? ?BUE PROM/AAROM/AROM as follows: ?Shoulder flexion/extension, elbow flexion/extension, and shoulder abduction. ?Emphasis on correct form. ? ?Pt remained in bed with all needs within reach. Husband present. ? ?Therapy Documentation ?Precautions:  ?Precautions ?Precautions: Fall ?Restrictions ?Weight Bearing Restrictions: No ? ?Pain: ? Pt reports RLE discomfort/numbness seated in w/c; returned to bed and repositioned ? ? ?Therapy/Group: Individual Therapy ? ?Rich Brave ?04/26/2022, 2:00 PM ?

## 2022-04-27 DIAGNOSIS — R5381 Other malaise: Secondary | ICD-10-CM | POA: Diagnosis not present

## 2022-04-27 MED ORDER — DULOXETINE HCL 60 MG PO CPEP
60.0000 mg | ORAL_CAPSULE | Freq: Every day | ORAL | Status: DC
  Administered 2022-04-27 – 2022-05-05 (×9): 60 mg via ORAL
  Filled 2022-04-27 (×10): qty 1

## 2022-04-27 NOTE — Progress Notes (Signed)
Occupational Therapy Session Note ? ?Patient Details  ?Name: Stacy Hanson ?MRN: [id number] ?Date of Birth: [birth year] ? ?Today's Date: 04/27/2022 ?OT Individual Time: [id number] ?OT Individual Time Calculation (min): 55 min  ? ? ?Short Term Goals: ?Week 3:  OT Short Term Goal 1 (Week 3): Pt will perform BSC tranfser with min A stand pivot with RW ?OT Short Term Goal 2 (Week 3): Pt will maintain standing balance at sink with min A for grooming tasks ?OT Short Term Goal 3 (Week 3): Pt will perform UB dressing tasks with min A ? ?Skilled Therapeutic Interventions/Progress Updates:  ?  Pt resting in bed upon arrival. See pain below. Initial focus on myofascial release and PROM for pain mgmt to actively participate in therapy. Pt reported relief and agreeable to sitting EOB and transitioning to transfer to w/c. Supine>sit EOB with min A. Pt able to scoot hips to EOB with supervsion in preparation for stand pivot transfer to w/c. Sit>stand from EOB with mod A. Stand pivot transfer with CGA and min verbal cues for technique. Heat packs applied to Lt lower back and Lt hip with relief noted. Pt remained in w/c with all needs within reach.  ? ?Therapy Documentation ?Precautions:  ?Precautions ?Precautions: Fall ?Restrictions ?Weight Bearing Restrictions: No ?Pain: ?Pt c/o LLE (hamstring and quad) "charley horse" and pain; repositioning and heat applied ? ? ? ?Therapy/Group: Individual Therapy ? ?Rich Brave ?04/27/2022, 2:39 PM ?

## 2022-04-27 NOTE — Progress Notes (Signed)
?                                                       PROGRESS NOTE ? ? ?Subjective/Complaints: ? ?Pt reports falls down in the crack of bed overnight so "breakfast is a struggle".  ?Is really sore from therapy- took Oxy o/n due to soreness.  ? ?Also wondering about Kpad- is ordered- let her know. She walked 34 ft total yesterday per pt.  ? ?No more dizziness. No side effects from Duloxetine.  ? ?ROS: ?Pt denies SOB, abd pain, CP, N/V/C/D, and vision changes ? ? ?Objective: ?  ?No results found. ?No results for input(s): WBC, HGB, HCT, PLT in the last 72 hours. ? ? ?Recent Labs  ?  04/26/22 ?0423  ?NA 140  ?K 3.9  ?CL 100  ?CO2 33*  ?GLUCOSE 107*  ?BUN 20  ?CREATININE 0.99  ?CALCIUM 9.2  ? ? ?Intake/Output Summary (Last 24 hours) at 04/27/2022 0844 ?Last data filed at 04/27/2022 0809 ?Gross per 24 hour  ?Intake 240 ml  ?Output 1175 ml  ?Net -935 ml  ?  ? ?  ? ?Physical Exam: ? ? ?General: awake, alert, appropriate, needing 2 NT's to pull up in bed; wearing O2 by [place]; 2L; NAD ?HENT: conjugate gaze; oropharynx moist ?CV: regular rate; no JVD ?Pulmonary: CTA B/L; no W/R/R- good air movement ?GI: soft, NT, ND, (+)BS- protuberant-  ?Psychiatric: appropriate ?Neurological: Ox3 ?Musculoskeletal: In wheelchair.  Patient has diffuse weakness ?Comments: Limited ROM bilateral shoulder due to stiffness with crepitus observed left elbow with attempts at ROM.  However no pain with PROM of left shoulder.   ?Skin:Wounds are covered with Tegaderm which was changed yesterday bilateral ankles appear clean.  +1 lower extremity edema bilaterally.  Her skin is warm and dry and intact ?Psychiatric: Appropriate ? ?Vital Signs ?Blood pressure (!) 157/72, pulse 79, temperature 98 ?F (36.7 ?C), temperature source Oral, resp. rate 18, height 5\' 3"  (1.6 m), weight (!) 146 kg, SpO2 96 %. ? ? ? ?Assessment/Plan: ?1. Functional deficits which require 3+ hours per day of interdisciplinary therapy in a comprehensive inpatient rehab  setting. ?Physiatrist is providing close team supervision and 24 hour management of active medical problems listed below. ?Physiatrist and rehab team continue to assess barriers to discharge/monitor patient progress toward functional and medical goals ? ?Care Tool: ? ?Bathing ?   ?Body parts bathed by patient: Face, Chest  ? Body parts bathed by helper: Right arm, Left arm, Buttocks, Front perineal area, Abdomen ?  ?  ?Bathing assist Assist Level: 2 Helpers (second helper for rolling, lifting legs for pericare) ?  ?  ?Upper Body Dressing/Undressing ?Upper body dressing   ?What is the patient wearing?: Pull over shirt ?   ?Upper body assist Assist Level: Moderate Assistance - Patient 50 - 74% ?   ?Lower Body Dressing/Undressing ?Lower body dressing ? ? ?   ?What is the patient wearing?: Incontinence brief ? ?  ? ?Lower body assist Assist for lower body dressing: 2 Helpers ?   ? ?Toileting ?Toileting Toileting Activity did not occur Landscape architect and hygiene only):  (incontient)  ?Toileting assist Assist for toileting: 2 Helpers ?  ?  ?Transfers ?Chair/bed transfer ? ?Transfers assist ? Chair/bed transfer activity did not occur: Refused ? ?Chair/bed transfer assist level: Contact Guard/Touching assist ?  ?  ?  Locomotion ?Ambulation ? ? ?Ambulation assist ? ? Ambulation activity did not occur: Safety/medical concerns ? ?Assist level: 2 helpers ?Assistive device: Walker-rolling ?Max distance: 1'  ? ?Walk 10 feet activity ? ? ?Assist ? Walk 10 feet activity did not occur: Safety/medical concerns ? ?Assist level: 2 helpers ?Assistive device: Walker-rolling  ? ?Walk 50 feet activity ? ? ?Assist Walk 50 feet with 2 turns activity did not occur: Safety/medical concerns ? ?  ?   ? ? ?Walk 150 feet activity ? ? ?Assist Walk 150 feet activity did not occur: Safety/medical concerns ? ?  ?  ?  ? ?Walk 10 feet on uneven surface  ?activity ? ? ?Assist Walk 10 feet on uneven surfaces activity did not occur: Safety/medical  concerns ? ? ?  ?   ? ?Wheelchair ? ? ? ? ?Assist Is the patient using a wheelchair?: Yes ?Type of Wheelchair: Manual ?Wheelchair activity did not occur: Refused ? ?Wheelchair assist level: Minimal Assistance - Patient > 75% ?Max wheelchair distance: 10'  ? ? ?Wheelchair 50 feet with 2 turns activity ? ? ? ?Assist ? ?  ?Wheelchair 50 feet with 2 turns activity did not occur: Refused ? ? ?   ? ?Wheelchair 150 feet activity  ? ? ? ?Assist ? Wheelchair 150 feet activity did not occur: Refused ? ? ?   ? ?Blood pressure (!) 157/72, pulse 79, temperature 98 ?F (36.7 ?C), temperature source Oral, resp. rate 18, height 5\' 3"  (1.6 m), weight (!) 146 kg, SpO2 96 %. ? ?Medical Problem List and Plan: ?1. Functional deficits secondary to debility-for complicated medical stay-CHF exacerbation and bacteremia ?           - Patient may shower ?            -ELOS/Goals: 14 to 18 days MinA PT and OT ? Walked 34 ft total yesterday ?Continue CIR- PT, OT  ?Team conference today to f/u on progress ?  ?2.  Antithrombotics: ?-DVT/anticoagulation: Mechanical: Sequential compression devices,   below-knee bilateral lower extremities ?           -04/09/2022 Dopplers negative for DVT's ?4/22-patient refusing to use SCDs however educated on need to use them at risk for DVT's ? 5/9- says is using some ?            -antiplatelet therapy: N/A due to GIB ?3.  Left hip and shoulder pain with myalgias, pain management: Oxycodone as needed for diffuse pain. ?04/08/2022-we will schedule tramadol 100 mg every 8 hours not every 6 hours due to renal issues-creatinine 1.31-continue oxycodone for pain as needed-was taking NSAIDs at home which caused a GI bleed. ?04/12/2022-continue tramadol scheduled and oxycodone as needed-04/13/2022-will not go home on oxycodone just tramadol ?-04/17/2022 patient is well controlled under current pain regimen ?-4/30 No issues of pain overnight. ? 5/1- pt pain controlled- will be able to go home on Tramadol- not Oxy- she's having  more pain due to exercise it appears,  ?  5/2- adding Duloxetine 30 mg nightly for nerve pain- explained will take a few days to kick in - will increase next week, or can be increased as of 04/23/22 at the earliest.  ?5/3- no side effects- but slept better- change to 60 mg on 5/5 ? 5/5 Duloxetine dose increased to 60mg  daily ?5/6 patient reports pain is well managed today continue current regimen. ?5/7 Dr. Naaman Plummer decreased Duloxetine dose to 30 mg since patient had episode of dizziness yesterday, tolerating new dose well so far. ?  5/9- will try again to increase Duloxetine- if gets dizzy again, will reduce dose-  ?4. Mood: LCSW to follow for evaluation and support. ?- Antipsychotic agents: Not applicable ?5. Neuropsych: This patient is capable of making decisions on her own behalf. ?6. Skin/Wound Care: Pressure relief measures. ?- Monitor for recurrent peripheral edema with increase in activity. ?Modify RF as indicated. ?   -04/08/2022 on low air loss mattress: Continue that along with 31M Tegaderm for ankles bilaterally per WOC ?   -04/12/2022 understands that she will have to remain on direct mattress ?    -Request that pressure is High ?   -04/15/22 switch to Kreg bed. ?   -04/16/2022-much improved pain with ROHO in chair and Kreg bed for coccyx pain ?   -04/17/2022 has adjusted to Kreg bed she continues to have 31M Tegaderm for ankles per WOC ?-5/6 wound is care and skin appear to be doing well.  Continue the 31M Tegaderm on ankles. ?5/7 Ankle/leg swelling continues to improve. ?7. Fluids/Electrolytes/Nutrition: Routine in and outs with follow-up chemistries ?  -4/29 Continue to monitor chemistries. ?5/1- CO2 up to 35- will need to likely wean O2 as tolerated ?            5/3- will check labs in AM to see where CO2 is.  ?            5/4 Co2 31, continue current treatment ?5/6 last CO2 is at 33 slightly slightly up from yesterday's value of 3.1 we will continue to monitor while looking at weaning oxygen as tolerated.  BUN  and creatinine are slightly elevated.  We will continue to trend labs. ? ?   ?Component Value Date/Time  ? NA 140 04/26/2022 0423  ? K 3.9 04/26/2022 0423  ? CL 100 04/26/2022 0423  ? CO2 33 (H) 04/26/2022 0423  ? GLUCOSE 1

## 2022-04-27 NOTE — Patient Care Conference (Signed)
Inpatient RehabilitationTeam Conference and Plan of Care Update ?Date: 04/27/2022   Time: 11:12 AM  ? ? ?Patient Name: Stacy Hanson      ?Medical Record Number: [id number]  ?Date of Birth: [birth year] ?Sex: Female         ?Room/Bed: 2V03J/0K93G-18 ?Payor Info: Payor: MEDICARE / Plan: MEDICARE PART A AND B / Product Type: *No Product type* /   ? ?Admit Date/Time:  04/07/2022  2:08 PM ? ?Primary Diagnosis:  Debility ? ?Hospital Problems: Principal Problem: ?  Debility ? ? ? ?Expected Discharge Date: Expected Discharge Date: 05/06/22 ? ?Team Members Present: ?Physician leading conference: Dr. Genice Rouge ?Social Worker Present: Cecile Sheerer, LCSWA ?Nurse Present: Kennyth Arnold, RN ?PT Present: Peter Congo, PT ?OT Present: Ardis Rowan, Jaynee Eagles, OT ?PPS Coordinator present : Fae Pippin, SLP ? ?   Current Status/Progress Goal Weekly Team Focus  ?Bowel/Bladder ? ? continent/incontinent b/b  regain continence  offer toileting assist as needed.   ?Swallow/Nutrition/ Hydration ? ?           ?ADL's ? ? bathing-mod A; dressing-max A; tranfsers with RW-CGA; toileting-dependent  transfers-supervison; UB baitng/dressing-min A; LB dressing-mod A; toileting-max A  bed mobility, sitting balance, standing balance, education   ?Mobility ? ? min A rolling, CGA supine to sit with HOB elevated, +2 sit to supine, min to mod A to stand to RW, gait x 30 ft with RW CGA + w/c follow  upgraded goals to mod A bed mobility, Supervision transfers, Supervision gait x 100 ft  bed mobility, sit to stand, increasing gait distance, endurance   ?Communication ? ?           ?Safety/Cognition/ Behavioral Observations ?           ?Pain ? ? some pain reported, Oxy IR, Tylenol available  < 3  assess pain q 4 hr and prn   ?Skin ? ? blisters to BLE, Clear dressing CDI  no new breakdown  assess skin q shift and prn   ? ? ?Discharge Planning:  ?D/c to home with pt husband. Pt husband woud like for pt to be as independent as possible. Pt  home BiPAP machine has been set up by [employer]. HHA- [facility] for HHPT/OT/SN (wound care)/aide. DME- RW ordered. Pt declined w/c as not able to fit thorugh hallways, and BSC (already has one).   ?Team Discussion: ?Really sore from therapy, requesting Oxy IR. Fascia very tight. Adjusting Duloxetine. No need for PO abx. CBC scheduled for tomorrow. Incontinent B/B, able to transfer to [facility]. Wound care to BLE to stay in place with no nursing intervention. Discharging home with spouse. Dizziness reported. Husband very attentive. Will attempt to wean oxygen. ? ?Patient on target to meet rehab goals: ?yes, min/mod assist goals. Currently mod/max assist bed mobility lifting legs into bed. CGA transfers, ambulated 30 ft. Working with left arm to increase mobility.  ? ?*See Care Plan and progress notes for long and short-term goals.  ? ?Revisions to Treatment Plan:  ?Monitoring labs. ?  ?Teaching Needs: ?Family education, medication/pain management, skin/wound care, transfer/gait training, etc. ?  ?Current Barriers to Discharge: ?Incontinence and Wound care ? ?Possible Resolutions to Barriers: ?Family education ?Time voiding schedule ?  ? ? Medical Summary ?Current Status: incontinent B/B- can use BSC/female urinal-O12 sats 89-92% whether in 0-1L O2- ? Barriers to Discharge: Decreased family/caregiver support;Home enviroment access/layout;Behavior;Medical stability;Wound care;Weight;Weight bearing restrictions;New oxygen;Incontinence ? Barriers to Discharge Comments: going home with husband- transfers/gait better- working on increasing function in  L>R arms- struggles with ADLs esp ?Possible Resolutions to [employer]: will remove PICC after CBC ok; try without O2; standing hardest- but making slow/progress-self limiting still - usually due to pain- d/c 5/18 ? ? ?Continued Need for Acute Rehabilitation Level of Care: The patient requires daily medical management by a physician with specialized training in physical  medicine and rehabilitation for the following reasons: ?Direction of a multidisciplinary physical rehabilitation program to maximize functional independence : Yes ?Medical management of patient stability for increased activity during participation in an intensive rehabilitation regime.: Yes ?Analysis of laboratory values and/or radiology reports with any subsequent need for medication adjustment and/or medical intervention. : Yes ? ? ?I attest that I was present, lead the team conference, and concur with the assessment and plan of the team. ? ? ?Kennyth Arnold G ?04/27/2022, 3:43 PM  ? ? ? ? ? ? ?

## 2022-04-27 NOTE — Progress Notes (Signed)
Occupational Therapy Session Note ? ?Patient Details  ?Name: Stacy Hanson ?MRN: [id number] ?Date of Birth: 1951-12-16 ? ?Today's Date: 04/27/2022 ?OT Individual Time: 7989-2119 ?OT Individual Time Calculation (min): 55 min  ? ? ?Short Term Goals: ?Week 3:  OT Short Term Goal 1 (Week 3): Pt will perform BSC tranfser with min A stand pivot with RW ?OT Short Term Goal 2 (Week 3): Pt will maintain standing balance at sink with min A for grooming tasks ?OT Short Term Goal 3 (Week 3): Pt will perform UB dressing tasks with min A ? ?Skilled Therapeutic Interventions/Progress Updates:  ?  Pt resting in bed upon arrival. Pain as noted but agreeable to BUE PROM/AAROM. Soft tissue mobilizations to Lt upper traps and rhomboids. Bed mobility including rolling R/L with max A+2 for placement of blanket.  ? ?BUE PROM/AAROM/AROM for shoulder flexion, elbow flexion/extension, shoulder abduction, shoulder elevation, and scapular retraction. ? ?Pt reported increased mobility after exercises/stretching. ? ?Pt remained in bed with all needs within reach.  ? ?Therapy Documentation ?Precautions:  ?Precautions ?Precautions: Fall ?Restrictions ?Weight Bearing Restrictions: No ?Pain: ?Pt reports her back, arms, and legs are sore from previous day's therapy; repositioned ? ? ? ?Therapy/Group: Individual Therapy ? ?Rich Brave ?04/27/2022, 11:32 AM ?

## 2022-04-27 NOTE — Progress Notes (Signed)
Physical Therapy Session Note ? ?Patient Details  ?Name: Stacy Hanson ?MRN: [id number] ?Date of Birth: [birth year] ? ?Today's Date: 04/27/2022 ?PT Individual Time: 8850-2774 ?PT Individual Time Calculation (min): 40 min  ? ?Short Term Goals: ?Week 3:  PT Short Term Goal 1 (Week 3): Pt will perform bed mobility with assist x 1 consistently ?PT Short Term Goal 2 (Week 3): Pt will ambulate x 50 ft with LRAD and min A ?PT Short Term Goal 3 (Week 3): Pt will tolerate standing x 5 min while performing functional task ?PT Short Term Goal 4 (Week 3): Pt will perform sit to stand with min A x 1 consistently ? ?Skilled Therapeutic Interventions/Progress Updates:  ?  Pt received seated in w/c in room, appears to be in distress and reports being in a significant amount of pain in her LLE. Pt describes pain as "cramping" from the back of her knee up into buttocks. Attempted to perform seated LLE HS stretch with no relief. Pt agreeable to return to bed to attempt stretching and repositioning at bed level. Nursing also notified of pt's pain level and that pt requesting pain medication. Sit to stand with max A to RW due to pain. Stand pivot transfer to bed with RW and CGA. Sit to supine assist x 2 for LE management and repositioning. In sidelying performed trigger point release to L HS muscles. Rolling L/R with mod A needed this date. In supine performed piriformis stretch and HS stretch as well as sciatic nerve glides. Pt reports reproduction of pain with nerve glides. Pt also reports onset of low back pain following bed mobility. Supine LTR x 10 reps each direction with some relief noted. Pt also reports some relief of pain with use of hot packs. Placed short-acting hot packs on L-side low back and posterior hip/thigh region as well as R posterior hip/thigh region for pain management. Pt left seated in bed with needs in reach. Pt missed 35 min of scheduled therapy session due to pain this afternoon. Pt on 1L O2 via [place] initially  at start of therapy session, removed supplemental O2 for duration of session. SpO2 94% with activity during session, left O2 off at end of session. ? ?Therapy Documentation ?Precautions:  ?Precautions ?Precautions: Fall ?Restrictions ?Weight Bearing Restrictions: No ?General: ?PT Amount of Missed Time (min): 35 Minutes ?PT Missed Treatment Reason: Pain ? ? ? ? ? ?Therapy/Group: Individual Therapy ? ? ?Peter Congo, PT, DPT, CSRS ?04/27/2022, 3:20 PM  ?

## 2022-04-28 DIAGNOSIS — R5381 Other malaise: Secondary | ICD-10-CM | POA: Diagnosis not present

## 2022-04-28 LAB — CBC WITH DIFFERENTIAL/PLATELET
Abs Immature Granulocytes: 0.02 10*3/uL (ref 0.00–0.07)
Basophils Absolute: 0.1 10*3/uL (ref 0.0–0.1)
Basophils Relative: 1 %
Eosinophils Absolute: 0.2 10*3/uL (ref 0.0–0.5)
Eosinophils Relative: 3 %
HCT: 34.4 % — ABNORMAL LOW (ref 36.0–46.0)
Hemoglobin: 10.6 g/dL — ABNORMAL LOW (ref 12.0–15.0)
Immature Granulocytes: 0 %
Lymphocytes Relative: 14 %
Lymphs Abs: 1 10*3/uL (ref 0.7–4.0)
MCH: 26.8 pg (ref 26.0–34.0)
MCHC: 30.8 g/dL (ref 30.0–36.0)
MCV: 86.9 fL (ref 80.0–100.0)
Monocytes Absolute: 0.4 10*3/uL (ref 0.1–1.0)
Monocytes Relative: 6 %
Neutro Abs: 5.8 10*3/uL (ref 1.7–7.7)
Neutrophils Relative %: 76 %
Platelets: 297 10*3/uL (ref 150–400)
RBC: 3.96 MIL/uL (ref 3.87–5.11)
RDW: 17.2 % — ABNORMAL HIGH (ref 11.5–15.5)
WBC: 7.6 10*3/uL (ref 4.0–10.5)
nRBC: 0 % (ref 0.0–0.2)

## 2022-04-28 MED ORDER — PREDNISONE 20 MG PO TABS
20.0000 mg | ORAL_TABLET | Freq: Every day | ORAL | Status: AC
  Administered 2022-04-28 – 2022-04-30 (×3): 20 mg via ORAL
  Filled 2022-04-28 (×3): qty 1

## 2022-04-28 NOTE — Progress Notes (Addendum)
Patient ID: Stacy Hanson, female   DOB: [birth year], 71 y.o.   MRN: [id number] ? ?Per therapy team, pt family has now asked for hospital bed, and would like to schedule more family education.; also canceled RW as requested by patient. SW will f/u with family and order DME. ? ?*SW met with pt and pt husband who report they would like hospital bed in the event the bed they ordered does not arrive on time. They also report that they do not  have a commode as they sent all items back. They are considering ambulance transport and will confirm. Family edu scheduled for Tuesday 1pm until completed to practice car transfers.  ? ?[name], MSW, LCSWA ?Office: [phone] ?Cell: [phone] ?Fax: [phone]  ?

## 2022-04-28 NOTE — Progress Notes (Signed)
?                                                       PROGRESS NOTE ? ? ?Subjective/Complaints: ? ? ?Had an episode of "horrific" exposed nerve pain- thought was " sciatic nerve attack".  ? ?Worse than ever done before.  ?Started LLE/hip and then went to RLE/low back.  ?Thinks because "overdid in therapy" and pulling self up in bed yesterday.  ? ? ? ?ROS: ? ?Pt denies SOB, abd pain, CP, N/V/C/D, and vision changes ? ? ?Objective: ?  ?No results found. ?Recent Labs  ?  04/28/22 ?0434  ?WBC 7.6  ?HGB 10.6*  ?HCT 34.4*  ?PLT 297  ? ? ? ?Recent Labs  ?  04/26/22 ?0423  ?NA 140  ?K 3.9  ?CL 100  ?CO2 33*  ?GLUCOSE 107*  ?BUN 20  ?CREATININE 0.99  ?CALCIUM 9.2  ? ? ?Intake/Output Summary (Last 24 hours) at 04/28/2022 0801 ?Last data filed at 04/28/2022 0543 ?Gross per 24 hour  ?Intake 720 ml  ?Output 1575 ml  ?Net -855 ml  ?  ? ?  ? ?Physical Exam: ? ? ? ?General: awake, alert, appropriate, laying in Kreg bed; NAD ?HENT: conjugate gaze; oropharynx moist- wearing O2 by [place] ?CV: regular rate; no JVD ?Pulmonary: CTA B/L; no W/R/R- good air movement ?GI: soft, NT, ND, (+)BS- BMI 56- down slightly ?Psychiatric: appropriate ?Neurological: Ox3 ?Musculoskeletal: In wheelchair.  Patient has diffuse weakness ?Comments: Limited ROM bilateral shoulder due to stiffness with crepitus observed left elbow with attempts at ROM.  However no pain with PROM of left shoulder.   ?Skin:Wounds are covered with Tegaderm which was changed yesterday bilateral ankles appear clean.  +1 lower extremity edema bilaterally.  Her skin is warm and dry and intact ? ? ?Vital Signs ?Blood pressure (!) 169/85, pulse 79, temperature 97.8 ?F (36.6 ?C), resp. rate 15, height 5\' 3"  (1.6 m), weight (!) 144.6 kg, SpO2 96 %. ? ? ? ?Assessment/Plan: ?1. Functional deficits which require 3+ hours per day of interdisciplinary therapy in a comprehensive inpatient rehab setting. ?Physiatrist is providing close team supervision and 24 hour management of active medical  problems listed below. ?Physiatrist and rehab team continue to assess barriers to discharge/monitor patient progress toward functional and medical goals ? ?Care Tool: ? ?Bathing ?   ?Body parts bathed by patient: Face, Chest  ? Body parts bathed by helper: Right arm, Left arm, Buttocks, Front perineal area, Abdomen ?  ?  ?Bathing assist Assist Level: 2 Helpers (second helper for rolling, lifting legs for pericare) ?  ?  ?Upper Body Dressing/Undressing ?Upper body dressing   ?What is the patient wearing?: Pull over shirt ?   ?Upper body assist Assist Level: Moderate Assistance - Patient 50 - 74% ?   ?Lower Body Dressing/Undressing ?Lower body dressing ? ? ?   ?What is the patient wearing?: Incontinence brief ? ?  ? ?Lower body assist Assist for lower body dressing: 2 Helpers ?   ? ?Toileting ?Toileting Toileting Activity did not occur Landscape architect and hygiene only):  (incontient)  ?Toileting assist Assist for toileting: 2 Helpers ?  ?  ?Transfers ?Chair/bed transfer ? ?Transfers assist ? Chair/bed transfer activity did not occur: Refused ? ?Chair/bed transfer assist level: Contact Guard/Touching assist ?  ?  ?Locomotion ?Ambulation ? ? ?  Ambulation assist ? ? Ambulation activity did not occur: Safety/medical concerns ? ?Assist level: 2 helpers ?Assistive device: Walker-rolling ?Max distance: 90'  ? ?Walk 10 feet activity ? ? ?Assist ? Walk 10 feet activity did not occur: Safety/medical concerns ? ?Assist level: 2 helpers ?Assistive device: Walker-rolling  ? ?Walk 50 feet activity ? ? ?Assist Walk 50 feet with 2 turns activity did not occur: Safety/medical concerns ? ?  ?   ? ? ?Walk 150 feet activity ? ? ?Assist Walk 150 feet activity did not occur: Safety/medical concerns ? ?  ?  ?  ? ?Walk 10 feet on uneven surface  ?activity ? ? ?Assist Walk 10 feet on uneven surfaces activity did not occur: Safety/medical concerns ? ? ?  ?   ? ?Wheelchair ? ? ? ? ?Assist Is the patient using a wheelchair?: Yes ?Type of  Wheelchair: Manual ?Wheelchair activity did not occur: Refused ? ?Wheelchair assist level: Minimal Assistance - Patient > 75% ?Max wheelchair distance: 10'  ? ? ?Wheelchair 50 feet with 2 turns activity ? ? ? ?Assist ? ?  ?Wheelchair 50 feet with 2 turns activity did not occur: Refused ? ? ?   ? ?Wheelchair 150 feet activity  ? ? ? ?Assist ? Wheelchair 150 feet activity did not occur: Refused ? ? ?   ? ?Blood pressure (!) 169/85, pulse 79, temperature 97.8 ?F (36.6 ?C), resp. rate 15, height 5\' 3"  (1.6 m), weight (!) 144.6 kg, SpO2 96 %. ? ?Medical Problem List and Plan: ?1. Functional deficits secondary to debility-for complicated medical stay-CHF exacerbation and bacteremia ?           - Patient may shower ?            -ELOS/Goals: 14 to 18 days MinA PT and OT ? D/c date 5/18 ? Continue CIR- PT, OT  ?2.  Antithrombotics: ?-DVT/anticoagulation: Mechanical: Sequential compression devices,   below-knee bilateral lower extremities ?           -04/09/2022 Dopplers negative for DVT's ?4/22-patient refusing to use SCDs however educated on need to use them at risk for DVT's ? 5/9- says is using some ?            -antiplatelet therapy: N/A due to GIB ?3.  Left hip and shoulder pain with myalgias, pain management: Oxycodone as needed for diffuse pain. ?04/08/2022-we will schedule tramadol 100 mg every 8 hours not every 6 hours due to renal issues-creatinine 1.31-continue oxycodone for pain as needed-was taking NSAIDs at home which caused a GI bleed. ?04/12/2022-continue tramadol scheduled and oxycodone as needed-04/13/2022-will not go home on oxycodone just tramadol ?-04/17/2022 patient is well controlled under current pain regimen ?-4/30 No issues of pain overnight. ? 5/1- pt pain controlled- will be able to go home on Tramadol- not Oxy- she's having more pain due to exercise it appears,  ?  5/2- adding Duloxetine 30 mg nightly for nerve pain- explained will take a few days to kick in - will increase next week, or can be  increased as of 04/23/22 at the earliest.  ?5/3- no side effects- but slept better- change to 60 mg on 5/5 ? 5/5 Duloxetine dose increased to 60mg  daily ?5/6 patient reports pain is well managed today continue current regimen. ?5/7 Dr. Naaman Plummer decreased Duloxetine dose to 30 mg since patient had episode of dizziness yesterday, tolerating new dose well so far. ? 5/9- will try again to increase Duloxetine- if gets dizzy again, will reduce dose-  ?  5/10- sciatic nerve pain- will give Prednisone 20 mg qday x 3 days.  ?4. Mood: LCSW to follow for evaluation and support. ?- Antipsychotic agents: Not applicable ?5. Neuropsych: This patient is capable of making decisions on her own behalf. ?6. Skin/Wound Care: Pressure relief measures. ?- Monitor for recurrent peripheral edema with increase in activity. ?Modify RF as indicated. ?   -04/08/2022 on low air loss mattress: Continue that along with 19M Tegaderm for ankles bilaterally per WOC ?   -04/12/2022 understands that she will have to remain on direct mattress ?    -Request that pressure is High ?   -04/15/22 switch to Kreg bed. ?   -04/16/2022-much improved pain with ROHO in chair and Kreg bed for coccyx pain ?   -04/17/2022 has adjusted to Kreg bed she continues to have 19M Tegaderm for ankles per WOC ?-5/6 wound is care and skin appear to be doing well.  Continue the 19M Tegaderm on ankles. ?5/7 Ankle/leg swelling continues to improve. ?7. Fluids/Electrolytes/Nutrition: Routine in and outs with follow-up chemistries ?  -4/29 Continue to monitor chemistries. ?5/1- CO2 up to 35- will need to likely wean O2 as tolerated ?            5/3- will check labs in AM to see where CO2 is.  ?            5/4 Co2 31, continue current treatment ?5/6 last CO2 is at 33 slightly slightly up from yesterday's value of 3.1 we will continue to monitor while looking at weaning oxygen as tolerated.  BUN and creatinine are slightly elevated.  We will continue to trend labs. ? ?   ?Component Value Date/Time   ? NA 140 04/26/2022 0423  ? K 3.9 04/26/2022 0423  ? CL 100 04/26/2022 0423  ? CO2 33 (H) 04/26/2022 0423  ? GLUCOSE 107 (H) 04/26/2022 0423  ? BUN 20 04/26/2022 0423  ? CREATININE 0.99 04/26/2022 0423  ? CALCIU

## 2022-04-28 NOTE — Progress Notes (Signed)
Occupational Therapy Session Note ? ?Patient Details  ?Name: Stacy Hanson ?MRN: [id number] ?Date of Birth: 14-Jul-1951 ? ?Today's Date: 04/28/2022 ?OT Individual Time: 1300-1350 ?OT Individual Time Calculation (min): 50 min  ? ? ?Short Term Goals: ?Week 3:  OT Short Term Goal 1 (Week 3): Pt will perform BSC tranfser with min A stand pivot with RW ?OT Short Term Goal 2 (Week 3): Pt will maintain standing balance at sink with min A for grooming tasks ?OT Short Term Goal 3 (Week 3): Pt will perform UB dressing tasks with min A ? ?Skilled Therapeutic Interventions/Progress Updates:  ?  Pt resting in bed upon arrival. Pt reported pain in back and BLE is better then earlier in day but still bothersome. Pt's husband present for education. Unable to complete OOB activities 2/2 pain. OT invervention with focus on bed mobility, discharge planning, DME requirements, and home safety. Pt's husband shared pictures of home setup. Pt already has hospital bed and hoyer lift but pt reports she never used them. Pt also has std BSC which witll be too narrow. Pt's husband assisted with bed mobility. Pt rolls in bed using bed rails with min A +1. Recommended use of bed rails for home bed if not using hospital bed. Will reschedule family ed when pt more mobile. CSW notified. Pt remained in bed with all needs within reach.  ? ?Therapy Documentation ?Precautions:  ?Precautions ?Precautions: Fall ?Restrictions ?Weight Bearing Restrictions: No ?Pain: ?Pain Assessment ?Pain Scale: 0-10 ?Pain Score: 8  ?Pain Type: Acute pain ?Pain Location: Hip ?Pain Orientation: Left ?Pain Radiating Towards: Legs ?Pain Descriptors / Indicators: Discomfort;Sharp;Shooting ?Pain Frequency: Intermittent ?Pain Intervention(s): repositioned ? ?Therapy/Group: Individual Therapy ? ?Rich Brave ?04/28/2022, 2:06 PM ?

## 2022-04-28 NOTE — Progress Notes (Signed)
Occupational Therapy Session Note ? ?Patient Details  ?Name: Stacy Hanson ?MRN: [id number] ?Date of Birth: July 26, 1951 ? ?Today's Date: 04/28/2022 ?OT Individual Time: [id number] ?OT Individual Time Calculation (min): 41 min  and Today's Date: 04/28/2022 ?OT Missed Time: 31 Minutes ?Missed Time Reason: Pain ? ? ?Short Term Goals: ?Week 3:  OT Short Term Goal 1 (Week 3): Pt will perform BSC tranfser with min A stand pivot with RW ?OT Short Term Goal 2 (Week 3): Pt will maintain standing balance at sink with min A for grooming tasks ?OT Short Term Goal 3 (Week 3): Pt will perform UB dressing tasks with min A ? ?Skilled Therapeutic Interventions/Progress Updates:  ?  Pt resting in bed upon arrival. See pain note below. BUE PROM/AAROM/AROM. AROM with 1# bar for biceps curls and modified chest presses. RUE abduction. LUE shoulder flexion. Pt resting in bed. All needs within reach.  ? ?Therapy Documentation ?Precautions:  ?Precautions ?Precautions: Fall ?Restrictions ?Weight Bearing Restrictions: No ?General: ?General ?OT Amount of Missed Time: 31 Minutes ? ?Pain: ? Had an episode of "horrific" exposed nerve pain- thought was " sciatic nerve attack". Repositioned and rest ? ? ?Therapy/Group: Individual Therapy ? ?Leroy Libman ?04/28/2022, 10:34 AM ?

## 2022-04-28 NOTE — Progress Notes (Signed)
Physical Therapy Session Note ? ?Patient Details  ?Name: Stacy Hanson ?MRN: [id number] ?Date of Birth: Oct 05, 1951 ? ?Today's Date: 04/28/2022 ?PT Individual Time: 4098-1191 ?PT Individual Time Calculation (min): 72 min  ? ?Short Term Goals: ?Week 3:  PT Short Term Goal 1 (Week 3): Pt will perform bed mobility with assist x 1 consistently ?PT Short Term Goal 2 (Week 3): Pt will ambulate x 50 ft with LRAD and min A ?PT Short Term Goal 3 (Week 3): Pt will tolerate standing x 5 min while performing functional task ?PT Short Term Goal 4 (Week 3): Pt will perform sit to stand with min A x 1 consistently ? ?Skilled Therapeutic Interventions/Progress Updates: Pt presented in bed agreeable to therapy. Pt states increased "sciatic" pain this am. Session focused on lumbar stabilization and core activation as well as stretching for pain management. PTA performed manual hamstring stretching 1 min x 3 and long axis distraction with pt stating some relief. Pt then instructed in TA activation with biofeedback with pt attempting to maintain contraction through breaths. Participated in [facility] SLR with neural glide 5 pumps x 5. Pt able to perform SAQ with TA contraction, heel slides with small ball under foot with TA contraction, and pelvic tilt x 10. Required increased time with each activities for education and rationale for activity. Provided education on upcoming family ed and discussed incorporating husband [name] into more therapy sessions. Also discussed use of hot pack at end of session with pt agreeable. After PTA obtained hot pack pt was able to roll to R with modA however without significant increase in pain. Pt checked at 10 min mark with no c/o discomfort. Pt did require x 2 assist for boosting to [facility]. Pt left in bed at end of session with call bell within reach and needs met.  ?   ? ?Therapy Documentation ?Precautions:  ?Precautions ?Precautions: Fall ?Restrictions ?Weight Bearing Restrictions: No ?General: ?  ?Vital  Signs: ?Therapy Vitals ?Temp: 98.3 ?F (36.8 ?C) ?Temp Source: Oral ?Pulse Rate: 85 ?Resp: 18 ?BP: 130/61 ?Patient Position (if appropriate): Lying ?Oxygen Therapy ?SpO2: 95 % ?O2 Device: Room Air ?Pain: ?  ?Mobility: ?  ?Locomotion : ?   ?Trunk/Postural Assessment : ?   ?Balance: ?  ?Exercises: ?  ?Other Treatments:   ? ? ? ?Therapy/Group: Individual Therapy ? ?Leslieanne Cobarrubias ?04/28/2022, 3:54 PM  ?

## 2022-04-28 NOTE — Progress Notes (Signed)
Physical Therapy Session Note ? ?Patient Details  ?Name: Stacy Hanson ?MRN: [id number] ?Date of Birth: 02-15-1951 ? ?Today's Date: 04/28/2022 ?PT Individual Time: 0867-6195 ?PT Individual Time Calculation (min): 60 min  ? ?Short Term Goals: ?Week 3:  PT Short Term Goal 1 (Week 3): Pt will perform bed mobility with assist x 1 consistently ?PT Short Term Goal 2 (Week 3): Pt will ambulate x 50 ft with LRAD and min A ?PT Short Term Goal 3 (Week 3): Pt will tolerate standing x 5 min while performing functional task ?PT Short Term Goal 4 (Week 3): Pt will perform sit to stand with min A x 1 consistently ? ?Skilled Therapeutic Interventions/Progress Updates:  ?  Pt received seated in bed, agreeable to PT session. Pt reports ongoing sciatic pain in BLE, L>R. Pt reports she was able to receive a dose of predisone around lunch time. Pt declines any OOB mobility due to pain, agreeable to bed level stretching and discussion with husband Kathlene November for family education. Performed B hip distraction mobilization, sciatic nerve glides, HS and piriformis stretches to tolerance bilaterally. Provided moist hot pack to L lower back region at end of session for pain management. Discussed equipment pt will require upon d/c home, car transfer vs ambulance transport home (will trial real car transfer next week), setting up chairs around the home so pt can take breaks as needed during ambulation, etc. Pt will also need to practice ascending/descending a ramp prior to d/c home next week. Pt left seated in bed with needs in reach, husband present. ? ?Therapy Documentation ?Precautions:  ?Precautions ?Precautions: Fall ?Restrictions ?Weight Bearing Restrictions: No ? ? ? ? ? ? ?Therapy/Group: Individual Therapy ? ? ?Peter Congo, PT, DPT, CSRS ?04/28/2022, 3:51 PM  ?

## 2022-04-29 DIAGNOSIS — R5381 Other malaise: Secondary | ICD-10-CM | POA: Diagnosis not present

## 2022-04-29 NOTE — Progress Notes (Signed)
Occupational Therapy Weekly Progress Note ? ?Patient Details  ?Name: Stacy Hanson ?MRN: [id number] ?Date of Birth: [birth year] ? ?Beginning of progress report period: Apr 23, 2022 ?End of progress report period: Apr 29, 2022 ? ?Patient has met 3 of 3 short term goals.  Pt is making slow but steady progress with BADLs and functional transfers. Pt completes bathing/dressing tasks at bed level and sitting in w/c at sink. Sit<>stand with CGA from elevated surface and min A/mod A from lover surface. Standing balance for tasks at sink with CGA. Pt requires mod verbal cues for upright standing posture to avoid leaning on RW with BUE. Stand pivot transfers using RW with min A/CGA. Pt's husband has been present for therapy and is scheduled for further education before discharge home. Pt will require a bariatric BSC secondary to body habitus. Pt becomes "wedged" when using std BSC, requiring external force to "pry" BSC from pt. There is considerable concern for skin abrasions with continued use of std BSC.  ? ?Patient continues to demonstrate the following deficits: muscle weakness, decreased cardiorespiratoy endurance, and decreased standing balance and decreased balance strategies and therefore will continue to benefit from skilled OT intervention to enhance overall performance with BADL and Reduce care partner burden. ? ?Patient progressing toward long term goals..  Continue plan of care. ? ?OT Short Term Goals ?Week 3:  OT Short Term Goal 1 (Week 3): Pt will perform BSC tranfser with min A stand pivot with RW ?OT Short Term Goal 1 - Progress (Week 3): Met ?OT Short Term Goal 2 (Week 3): Pt will maintain standing balance at sink with min A for grooming tasks ?OT Short Term Goal 2 - Progress (Week 3): Met ?OT Short Term Goal 3 (Week 3): Pt will perform UB dressing tasks with min A ?OT Short Term Goal 3 - Progress (Week 3): Met ?Week 4:  OT Short Term Goal 1 (Week 4): STG=LTG 2/2 ELOS ? ? ? ?Leroy Libman ?04/29/2022, 6:41 AM  ?

## 2022-04-29 NOTE — Progress Notes (Signed)
Physical Therapy Session Note ? ?Patient Details  ?Name: Stacy Hanson ?MRN: [id number] ?Date of Birth: [birth year] ? ?Today's Date: 04/29/2022 ?PT Individual Time: 1007-1219 ?PT Individual Time Calculation (min): 77 min  ? ?Short Term Goals: ?Week 3:  PT Short Term Goal 1 (Week 3): Pt will perform bed mobility with assist x 1 consistently ?PT Short Term Goal 1 - Progress (Week 3): Met ?PT Short Term Goal 2 (Week 3): Pt will ambulate x 50 ft with LRAD and min A ?PT Short Term Goal 2 - Progress (Week 3): Progressing toward goal ?PT Short Term Goal 3 (Week 3): Pt will tolerate standing x 5 min while performing functional task ?PT Short Term Goal 3 - Progress (Week 3): Met ?PT Short Term Goal 4 (Week 3): Pt will perform sit to stand with min A x 1 consistently ?PT Short Term Goal 4 - Progress (Week 3): Progressing toward goal ? ?Skilled Therapeutic Interventions/Progress Updates: Pt presented in bed agreeable to therapy with brother and husband present. Pt states pain significant reduced due to introduction of steroid meds. Discussed increased participation with husband as pt anticipate d/c next week. Pt performed supine to sit with close supervision increased time and use of bed features. PTA donned shoes total A. Performed stand pivot to w/c with minA for Sit to stand and CGA for ambulatory transfer. Pt maintained standing to allow PTA to adjust brief. Pt then transported to day room and participated in [employer] Kinetron 2 bouts of 2 min at 60cm/sec for ROM and to allow pt to "loosen up" per her request. Pt then performed Sit to stand x 3 with husband [name]. Pt educated to rock to build momentum as well as using RUE to push from w/c vs holding onto RW. Husband was able to provide appropriate assistance and cues. With pt pushing from w/c pt was able to perform Sit to stand with CGA! Participated in ambulation with RW and w/c follow 24f and 232fwith CGA overall. Pt noted to have improved postural control and improved B  foot clearance as compared to previous time with this therapist. SpO2 maintained >90% on RA with each bout. Pt transported remaining distance back to room and performed ambulatory transfer to bed. Pt was modA x 1 sit to supine with PTA providing assist for BLE management. Pt required max[facility] x [facility] to "[facility]immy" a little towards HOB. Pt instructed in using ipsilateral LE to facilitate rolling to L/R allowing pt to roll with minA and x 1 roll to L without physical assistance. Pt left in bed at end of session with husband present and current needs met.  ?Therapy Documentation ?Precautions:  ?Precautions ?Precautions: Fall ?Restrictions ?Weight Bearing Restrictions: No ?General: ?  ?Vital Signs: ?Therapy Vitals ?Temp: 98.6 ?F (37 ?C) ?Temp Source: Oral ?Pulse Rate: 76 ?Resp: 15 ?BP: (!) 142/60 ?Patient Position (if appropriate): Lying ?Oxygen Therapy ?SpO2: 92 % ?O2 Device: Nasal Cannula ? ? ?Therapy/Group: Individual Therapy ? ?Margie Brink ?04/29/2022, 3:41 PM  ?

## 2022-04-29 NOTE — Progress Notes (Signed)
Patient resting on home CPAP with 1L O2 bleed in.  ?

## 2022-04-29 NOTE — Progress Notes (Signed)
?                                                       PROGRESS NOTE ? ? ?Subjective/Complaints: ? ? ?Pt reports SO MUCH BETTER! ?Did stretching and exercises- didn't walk yesterday and took prednisone.  ? ?Didn't wear CPAP due to sinus HA.  ?Pain of sciatic nerve almost gone.  ?Says bed broken, but they are going to replace tomorrow.  ? ?ROS: ? ?Pt denies SOB, abd pain, CP, N/V/C/D, and vision changes ? ? ?Objective: ?  ?No results found. ?Recent Labs  ?  04/28/22 ?0434  ?WBC 7.6  ?HGB 10.6*  ?HCT 34.4*  ?PLT 297  ? ? ? ?No results for input(s): NA, K, CL, CO2, GLUCOSE, BUN, CREATININE, CALCIUM in the last 72 hours. ? ? ?Intake/Output Summary (Last 24 hours) at 04/29/2022 0905 ?Last data filed at 04/29/2022 0847 ?Gross per 24 hour  ?Intake 476 ml  ?Output 700 ml  ?Net -224 ml  ?  ? ?  ? ?Physical Exam: ? ? ? ? ?General: awake, alert, appropriate, BMI 56; sitting up in kreg bed; NAD ?HENT: conjugate gaze; oropharynx moist- O2 not on right now; appears comfortable.  ?CV: regular rate; no JVD ?Pulmonary: CTA B/L; no W/R/R- good air movement ?GI: soft, NT, ND, (+)BS ?Psychiatric: appropriate- talkative ?Neurological: Ox3 ?Musculoskeletal: In wheelchair.  Patient has diffuse weakness ?Comments: Limited ROM bilateral shoulder due to stiffness with crepitus observed left elbow with attempts at ROM.  However no pain with PROM of left shoulder.   ?Skin:Wounds are covered with Tegaderm which was changed yesterday bilateral ankles appear clean.  +1 lower extremity edema bilaterally.  Her skin is warm and dry and intact ? ? ?Vital Signs ?Blood pressure (!) 159/74, pulse 77, temperature 97.8 ?F (36.6 ?C), resp. rate 15, height 5\' 3"  (1.6 m), weight (!) 145 kg, SpO2 95 %. ? ? ? ?Assessment/Plan: ?1. Functional deficits which require 3+ hours per day of interdisciplinary therapy in a comprehensive inpatient rehab setting. ?Physiatrist is providing close team supervision and 24 hour management of active medical problems listed  below. ?Physiatrist and rehab team continue to assess barriers to discharge/monitor patient progress toward functional and medical goals ? ?Care Tool: ? ?Bathing ?   ?Body parts bathed by patient: Face, Chest  ? Body parts bathed by helper: Right arm, Left arm, Buttocks, Front perineal area, Abdomen ?  ?  ?Bathing assist Assist Level: 2 Helpers (second helper for rolling, lifting legs for pericare) ?  ?  ?Upper Body Dressing/Undressing ?Upper body dressing   ?What is the patient wearing?: Pull over shirt ?   ?Upper body assist Assist Level: Moderate Assistance - Patient 50 - 74% ?   ?Lower Body Dressing/Undressing ?Lower body dressing ? ? ?   ?What is the patient wearing?: Incontinence brief ? ?  ? ?Lower body assist Assist for lower body dressing: 2 Helpers ?   ? ?Toileting ?Toileting Toileting Activity did not occur Landscape architect and hygiene only):  (incontient)  ?Toileting assist Assist for toileting: 2 Helpers ?  ?  ?Transfers ?Chair/bed transfer ? ?Transfers assist ? Chair/bed transfer activity did not occur: Refused ? ?Chair/bed transfer assist level: Contact Guard/Touching assist ?  ?  ?Locomotion ?Ambulation ? ? ?Ambulation assist ? ? Ambulation activity did not occur: Safety/medical concerns ? ?  Assist level: 2 helpers ?Assistive device: Walker-rolling ?Max distance: 29'  ? ?Walk 10 feet activity ? ? ?Assist ? Walk 10 feet activity did not occur: Safety/medical concerns ? ?Assist level: 2 helpers ?Assistive device: Walker-rolling  ? ?Walk 50 feet activity ? ? ?Assist Walk 50 feet with 2 turns activity did not occur: Safety/medical concerns ? ?  ?   ? ? ?Walk 150 feet activity ? ? ?Assist Walk 150 feet activity did not occur: Safety/medical concerns ? ?  ?  ?  ? ?Walk 10 feet on uneven surface  ?activity ? ? ?Assist Walk 10 feet on uneven surfaces activity did not occur: Safety/medical concerns ? ? ?  ?   ? ?Wheelchair ? ? ? ? ?Assist Is the patient using a wheelchair?: Yes ?Type of Wheelchair:  Manual ?Wheelchair activity did not occur: Refused ? ?Wheelchair assist level: Minimal Assistance - Patient > 75% ?Max wheelchair distance: 10'  ? ? ?Wheelchair 50 feet with 2 turns activity ? ? ? ?Assist ? ?  ?Wheelchair 50 feet with 2 turns activity did not occur: Refused ? ? ?   ? ?Wheelchair 150 feet activity  ? ? ? ?Assist ? Wheelchair 150 feet activity did not occur: Refused ? ? ?   ? ?Blood pressure (!) 159/74, pulse 77, temperature 97.8 ?F (36.6 ?C), resp. rate 15, height 5\' 3"  (1.6 m), weight (!) 145 kg, SpO2 95 %. ? ?Medical Problem List and Plan: ?1. Functional deficits secondary to debility-for complicated medical stay-CHF exacerbation and bacteremia ?           - Patient may shower ?            -ELOS/Goals: 14 to 18 days MinA PT and OT ? D/c date 5/18 ? Continue CIR- PT, OT -pain doing much better with prednisone ?2.  Antithrombotics: ?-DVT/anticoagulation: Mechanical: Sequential compression devices,   below-knee bilateral lower extremities ?           -04/09/2022 Dopplers negative for DVT's ?4/22-patient refusing to use SCDs however educated on need to use them at risk for DVT's ? 5/9- says is using some ?            -antiplatelet therapy: N/A due to GIB ?3.  Left hip and shoulder pain with myalgias, pain management: Oxycodone as needed for diffuse pain. ?04/08/2022-we will schedule tramadol 100 mg every 8 hours not every 6 hours due to renal issues-creatinine 1.31-continue oxycodone for pain as needed-was taking NSAIDs at home which caused a GI bleed. ?04/12/2022-continue tramadol scheduled and oxycodone as needed-04/13/2022-will not go home on oxycodone just tramadol ?-04/17/2022 patient is well controlled under current pain regimen ?-4/30 No issues of pain overnight. ? 5/1- pt pain controlled- will be able to go home on Tramadol- not Oxy- she's having more pain due to exercise it appears,  ?  5/2- adding Duloxetine 30 mg nightly for nerve pain- explained will take a few days to kick in - will increase  next week, or can be increased as of 04/23/22 at the earliest.  ?5/3- no side effects- but slept better- change to 60 mg on 5/5 ? 5/5 Duloxetine dose increased to 60mg  daily ?5/6 patient reports pain is well managed today continue current regimen. ?5/7 Dr. Naaman Plummer decreased Duloxetine dose to 30 mg since patient had episode of dizziness yesterday, tolerating new dose well so far. ? 5/9- will try again to increase Duloxetine- if gets dizzy again, will reduce dose-  ? 5/10- sciatic nerve pain- will give Prednisone  20 mg qday x 3 days.  ? 5/11- pain doing much better after prednisone 20 mg- will do for 2 more days.  ?4. Mood: LCSW to follow for evaluation and support. ?- Antipsychotic agents: Not applicable ?5. Neuropsych: This patient is capable of making decisions on her own behalf. ?6. Skin/Wound Care: Pressure relief measures. ?- Monitor for recurrent peripheral edema with increase in activity. ?Modify RF as indicated. ?   -04/08/2022 on low air loss mattress: Continue that along with 21M Tegaderm for ankles bilaterally per WOC ?   -04/12/2022 understands that she will have to remain on direct mattress ?    -Request that pressure is High ?   -04/15/22 switch to Kreg bed. ?   -04/16/2022-much improved pain with ROHO in chair and Kreg bed for coccyx pain ?   -04/17/2022 has adjusted to Kreg bed she continues to have 21M Tegaderm for ankles per WOC ?-5/6 wound is care and skin appear to be doing well.  Continue the 21M Tegaderm on ankles. ?5/7 Ankle/leg swelling continues to improve. ? 5/11- Kreg bed to be replaced tomorrow due to being broken- will send home with Gel overlay bed- ?7. Fluids/Electrolytes/Nutrition: Routine in and outs with follow-up chemistries ?  -4/29 Continue to monitor chemistries. ?5/1- CO2 up to 35- will need to likely wean O2 as tolerated ?            5/3- will check labs in AM to see where CO2 is.  ?            5/4 Co2 31, continue current treatment ?5/6 last CO2 is at 33 slightly slightly up from  yesterday's value of 3.1 we will continue to monitor while looking at weaning oxygen as tolerated.  BUN and creatinine are slightly elevated.  We will continue to trend labs. ? ?   ?Component Value Date/Time  ? NA 140

## 2022-04-29 NOTE — Progress Notes (Signed)
Patient ID: Stacy Hanson, female   DOB: 03/13/1951, 71 y.o.   MRN: [id number] ? ?[facility] ambulance transport schedule for 5/18 at 11am d/c to home if transport is still needed.  ? ?SW waiting on updates from [facility] if the bariatric BSC was returned. Waiting on documentation to support bariatric hospital bed needed.  ? ?Cecile Sheerer, MSW, LCSWA ?Office: [phone] ?Cell: [phone] ?Fax: [phone]  ?

## 2022-04-29 NOTE — Progress Notes (Signed)
Occupational Therapy Session Note ? ?Patient Details  ?Name: Stacy Hanson ?MRN: [id number] ?Date of Birth: Apr 10, 1951 ? ?Today's Date: 04/29/2022 ?OT Individual Time: 0865-7846 ?OT Individual Time Calculation (min): 84 min  ? ? ?Short Term Goals: ?Week 4:  OT Short Term Goal 1 (Week 4): STG=LTG 2/2 ELOS ? ?Skilled Therapeutic Interventions/Progress Updates:  ?  Pt resting in bed upon arrival. Pt reports that pain from yesterday is not present today. Pt reports she is ready for shower. Min A for supine>sit EOB. Sit>stand from EOB with min A. Stand pivot tranfser to shower chair with CGA. Bathing at shower level using LH sponge with mod A. Pt required min A to wash hair and initiated using BUE to wash hair. Pt controlled hand-held shower during bathing. Pt initiated use of LH sponge to BLE but required assistance to complete. Pt used shower head to clean periarea and buttocks. Pt states her shower/tub at home has a built-in bidet. Pt able to thread brief over BLE using reacher but required assistance pulling over hips. Pt donned gown with min A. Pt remained seated in w/c. Therapeutic activity-tapping beach ball with 1# bar 4x15 with focus on increased functional use of BUE with shoulder flexion and elbow extension. All needs within reach.  ? ?Therapy Documentation ?Precautions:  ?Precautions ?Precautions: Fall ?Restrictions ?Weight Bearing Restrictions: No ? ?Pain: ?Pt denies pain this morning. ? ? ? ?Therapy/Group: Individual Therapy ? ?Rich Brave ?04/29/2022, 10:56 AM ?

## 2022-04-29 NOTE — Progress Notes (Signed)
Physical Therapy Weekly Progress Note ? ?Patient Details  ?Name: Stacy Hanson ?MRN: [id number] ?Date of Birth: December 02, 1951 ? ?Beginning of progress report period: Apr 22, 2022 ?End of progress report period: Apr 29, 2022 ? ?Today's Date: 04/29/2022 ?PT Individual Time: 1100-1200 ?PT Individual Time Calculation (min): 60 min  ? ?Patient has met 2 of 4 short term goals. Pt had a setback over the past week with onset of sciatic nerve pain in BLE that limited participation in therapy sessions for several days. However, pt showed great improvement this date with good progress noted. Pt is overall mod A for bed mobility, min to mod A for sit to stand with RW, CGA for transfers with RW, and has been able to increase gait distance up to 45 ft with RW and min A for balance. Pt also exhibits improved ability to maneuver LLE during gait and functional tasks as well as decreased reliance on RUE forearm support on RW during transfers and gait. ? ?Patient continues to demonstrate the following deficits muscle weakness and muscle joint tightness, decreased cardiorespiratoy endurance, and decreased sitting balance, decreased standing balance, decreased postural control, and decreased balance strategies and therefore will continue to benefit from skilled PT intervention to increase functional independence with mobility. ? ?Patient progressing toward long term goals..  Continue plan of care. ? ?PT Short Term Goals ?Week 3:  PT Short Term Goal 1 (Week 3): Pt will perform bed mobility with assist x 1 consistently ?PT Short Term Goal 1 - Progress (Week 3): Met ?PT Short Term Goal 2 (Week 3): Pt will ambulate x 50 ft with LRAD and min A ?PT Short Term Goal 2 - Progress (Week 3): Progressing toward goal ?PT Short Term Goal 3 (Week 3): Pt will tolerate standing x 5 min while performing functional task ?PT Short Term Goal 3 - Progress (Week 3): Met ?PT Short Term Goal 4 (Week 3): Pt will perform sit to stand with min A x 1 consistently ?PT  Short Term Goal 4 - Progress (Week 3): Progressing toward goal ?Week 4:  PT Short Term Goal 1 (Week 4): =LTG due to ELOS ? ?Skilled Therapeutic Interventions/Progress Updates:  ?  Pt received seated in w/c in room, agreeable to PT session. Pt reports sciatic nerve pain gone today! Sit to stand initially with mod A to RW progressing to min A during session. Ambulation x 45 ft, x 35 ft with RW and min A for balance. Pt exhibits good, upright posture during gait and good LE clearance. Pt exhibits improved endurance for gait training this date. Standing alt L/R 1" step-taps x 8, x 7, x 9 reps with RW and min A for balance with much improved LLE clearance although pt does exhibit some circumduction L>R. Pt requests to return to bed at end of session. Sit to supine mod A needed for BLE management. Pt requires assist x 2 to reposition in bed. Pt left seated in bed with needs in reach, bed alarm in place. ? ?Therapy Documentation ?Precautions:  ?Precautions ?Precautions: Fall ?Restrictions ?Weight Bearing Restrictions: No ? ? ? ? ? ?Therapy/Group: Individual Therapy ? ? ?[name], PT, DPT, CSRS ?04/29/2022, 12:16 PM  ?

## 2022-04-30 DIAGNOSIS — R5381 Other malaise: Secondary | ICD-10-CM | POA: Diagnosis not present

## 2022-04-30 NOTE — Progress Notes (Signed)
?                                                       PROGRESS NOTE ? ? ?Subjective/Complaints: ? ? ?Pt reports she walked a total of >100 ft yesterday  ?154ft actually.  ?Also husband has done family ed.  ?O2 off of her this AM ?Wore CPAP some last night.  ? ? ?ROS: ? ?Pt denies SOB, abd pain, CP, N/V/C/D, and vision changes ? ? ?Objective: ?  ?No results found. ?Recent Labs  ?  04/28/22 ?0434  ?WBC 7.6  ?HGB 10.6*  ?HCT 34.4*  ?PLT 297  ? ? ? ?No results for input(s): NA, K, CL, CO2, GLUCOSE, BUN, CREATININE, CALCIUM in the last 72 hours. ? ? ?Intake/Output Summary (Last 24 hours) at 04/30/2022 1633 ?Last data filed at 04/30/2022 1029 ?Gross per 24 hour  ?Intake 358 ml  ?Output 1000 ml  ?Net -642 ml  ?  ? ?  ? ?Physical Exam: ? ? ? ? ? ?General: awake, alert, appropriate, laying in kreg bed; off O2 by [place]: NAD ?HENT: conjugate gaze; oropharynx moist ?CV: regular rate; no JVD ?Pulmonary: CTA B/L; no W/R/R- good air movement ?GI: soft, NT, ND, (+)BS ?Psychiatric: appropriate ?Neurological: Ox3 ?Musculoskeletal: In wheelchair.  Patient has diffuse weakness ?Comments: Limited ROM bilateral shoulder due to stiffness with crepitus observed left elbow with attempts at ROM.  However no pain with PROM of left shoulder.   ?Skin:Wounds are covered with Tegaderm which was changed yesterday bilateral ankles appear clean.  +1 lower extremity edema bilaterally.  Her skin is warm and dry and intact ? ? ?Vital Signs ?Blood pressure 139/71, pulse 74, temperature 98.1 ?F (36.7 ?C), temperature source Oral, resp. rate 18, height 5\' 3"  (1.6 m), weight (!) 145 kg, SpO2 97 %. ? ? ? ?Assessment/Plan: ?1. Functional deficits which require 3+ hours per day of interdisciplinary therapy in a comprehensive inpatient rehab setting. ?Physiatrist is providing close team supervision and 24 hour management of active medical problems listed below. ?Physiatrist and rehab team continue to assess barriers to discharge/monitor patient progress toward  functional and medical goals ? ?Care Tool: ? ?Bathing ?   ?Body parts bathed by patient: Right arm, Left arm, Chest, Abdomen, Right upper leg, Left upper leg, Face  ? Body parts bathed by helper: Right arm, Left arm, Buttocks, Front perineal area, Abdomen ?  ?  ?Bathing assist Assist Level: Moderate Assistance - Patient 50 - 74% ?  ?  ?Upper Body Dressing/Undressing ?Upper body dressing   ?What is the patient wearing?: Pull over shirt ?   ?Upper body assist Assist Level: Minimal Assistance - Patient > 75% ?   ?Lower Body Dressing/Undressing ?Lower body dressing ? ? ?   ?What is the patient wearing?: Incontinence brief ? ?  ? ?Lower body assist Assist for lower body dressing: Maximal Assistance - Patient 25 - 49% ?   ? ?Toileting ?Toileting Toileting Activity did not occur Landscape architect and hygiene only):  (incontient)  ?Toileting assist Assist for toileting: 2 Helpers ?  ?  ?Transfers ?Chair/bed transfer ? ?Transfers assist ? Chair/bed transfer activity did not occur: Refused ? ?Chair/bed transfer assist level: Contact Guard/Touching assist ?  ?  ?Locomotion ?Ambulation ? ? ?Ambulation assist ? ? Ambulation activity did not occur: Safety/medical concerns ? ?Assist level:  Minimal Assistance - Patient > 75% ?Assistive device: Walker-rolling ?Max distance: 57'  ? ?Walk 10 feet activity ? ? ?Assist ? Walk 10 feet activity did not occur: Safety/medical concerns ? ?Assist level: Minimal Assistance - Patient > 75% ?Assistive device: Walker-rolling  ? ?Walk 50 feet activity ? ? ?Assist Walk 50 feet with 2 turns activity did not occur: Safety/medical concerns ? ?  ?   ? ? ?Walk 150 feet activity ? ? ?Assist Walk 150 feet activity did not occur: Safety/medical concerns ? ?  ?  ?  ? ?Walk 10 feet on uneven surface  ?activity ? ? ?Assist Walk 10 feet on uneven surfaces activity did not occur: Safety/medical concerns ? ? ?  ?   ? ?Wheelchair ? ? ? ? ?Assist Is the patient using a wheelchair?: Yes ?Type of Wheelchair:  Manual ?Wheelchair activity did not occur: Refused ? ?Wheelchair assist level: Minimal Assistance - Patient > 75% ?Max wheelchair distance: 10'  ? ? ?Wheelchair 50 feet with 2 turns activity ? ? ? ?Assist ? ?  ?Wheelchair 50 feet with 2 turns activity did not occur: Refused ? ? ?   ? ?Wheelchair 150 feet activity  ? ? ? ?Assist ? Wheelchair 150 feet activity did not occur: Refused ? ? ?   ? ?Blood pressure 139/71, pulse 74, temperature 98.1 ?F (36.7 ?C), temperature source Oral, resp. rate 18, height 5\' 3"  (1.6 m), weight (!) 145 kg, SpO2 97 %. ? ?Medical Problem List and Plan: ?1. Functional deficits secondary to debility-for complicated medical stay-CHF exacerbation and bacteremia ?           - Patient may shower ?            -ELOS/Goals: 14 to 18 days MinA PT and OT ? D/c date 5/18 ? Continue CIR- PT, OT and SLP ? Pt will need a bariatric bed with gel overlay mattress due to venous stasis ulcers and BMI- of 56- even though she's ~ 319 lbs, not 350, her BMI due to her height, make her too large to fit in a regular hospital- we've tried and she doesn't fit.  ?2.  Antithrombotics: ?-DVT/anticoagulation: Mechanical: Sequential compression devices,   below-knee bilateral lower extremities ?           -04/09/2022 Dopplers negative for DVT's ?4/22-patient refusing to use SCDs however educated on need to use them at risk for DVT's ? 5/9- says is using some ?            -antiplatelet therapy: N/A due to GIB ?3.  Left hip and shoulder pain with myalgias, pain management: Oxycodone as needed for diffuse pain. ?04/08/2022-we will schedule tramadol 100 mg every 8 hours not every 6 hours due to renal issues-creatinine 1.31-continue oxycodone for pain as needed-was taking NSAIDs at home which caused a GI bleed. ?04/12/2022-continue tramadol scheduled and oxycodone as needed-04/13/2022-will not go home on oxycodone just tramadol ?-04/17/2022 patient is well controlled under current pain regimen ?-4/30 No issues of pain overnight. ?  5/1- pt pain controlled- will be able to go home on Tramadol- not Oxy- she's having more pain due to exercise it appears,  ?  5/2- adding Duloxetine 30 mg nightly for nerve pain- explained will take a few days to kick in - will increase next week, or can be increased as of 04/23/22 at the earliest.  ?5/3- no side effects- but slept better- change to 60 mg on 5/5 ? 5/5 Duloxetine dose increased to 60mg  daily ?5/6 patient  reports pain is well managed today continue current regimen. ?5/7 Dr. Naaman Plummer decreased Duloxetine dose to 30 mg since patient had episode of dizziness yesterday, tolerating new dose well so far. ? 5/9- will try again to increase Duloxetine- if gets dizzy again, will reduce dose-  ? 5/10- sciatic nerve pain- will give Prednisone 20 mg qday x 3 days.  ? 5/11- pain doing much better after prednisone 20 mg- will do for 2 more days.  ? 5/12- verified with pt can go home with 7 days of tramadol, but no oxycodone at d/c.  ?4. Mood: LCSW to follow for evaluation and support. ?- Antipsychotic agents: Not applicable ?5. Neuropsych: This patient is capable of making decisions on her own behalf. ?6. Skin/Wound Care: Pressure relief measures. ?- Monitor for recurrent peripheral edema with increase in activity. ?Modify RF as indicated. ?   -04/08/2022 on low air loss mattress: Continue that along with 32M Tegaderm for ankles bilaterally per WOC ?   -04/12/2022 understands that she will have to remain on direct mattress ?    -Request that pressure is High ?   -04/15/22 switch to Kreg bed. ?   -04/16/2022-much improved pain with ROHO in chair and Kreg bed for coccyx pain ?   -04/17/2022 has adjusted to Kreg bed she continues to have 32M Tegaderm for ankles per WOC ?-5/6 wound is care and skin appear to be doing well.  Continue the 32M Tegaderm on ankles. ?5/7 Ankle/leg swelling continues to improve. ? 5/11- Kreg bed to be replaced tomorrow due to being broken- will send home with Gel overlay bed- ? 5/12- will need bariatric  gel overlay- since her BMI is so high.  ?7. Fluids/Electrolytes/Nutrition: Routine in and outs with follow-up chemistries ?  -4/29 Continue to monitor chemistries. ?5/1- CO2 up to 35- will need to likely wea

## 2022-04-30 NOTE — Progress Notes (Signed)
Occupational Therapy Session Note ? ?Patient Details  ?Name: Stacy Hanson ?MRN: [id number] ?Date of Birth: 12-18-1951 ? ?Today's Date: 04/30/2022 ?OT Individual Time: 1104-1200 ?OT Individual Time Calculation (min): 56 min  ? ? ?Short Term Goals: ?Week 4:  OT Short Term Goal 1 (Week 4): STG=LTG 2/2 ELOS ? ?Skilled Therapeutic Interventions/Progress Updates:  ?  Pt received supine with no c/o pain, agreeable to OT session. Pt agreeable to OT session. She completed bed mobility with (S) to the EOB. Stand pivot transfer with min A to the w/c from the EOB using the RW. She was able to statically stand for 1 min as OT fastened brief. She completed oral care at the sink with set up assist and then reported urgent urinary need. She transferred to the bariatric BSC with min A, stand pivot with the RW. She voided urine and BM. She stood for total A peri hygiene. Discussed adaptive equipment and she reported she was comfortable with her husband assisting. She took a seated rest break and then brief was donned with max A. She transferred to the w/c with min A. She was taken via w/c to the therapy gym. She worked the remainder of the session on gravity eliminated forward shoulder flexion slides- on flat and then elevated surface. Pt reporting this gave her a great stretch and was a good challenge to deltoids. She returned to her room and was left sitting up with all needs met.  ? ? ?Therapy Documentation ?Precautions:  ?Precautions ?Precautions: Fall ?Restrictions ?Weight Bearing Restrictions: No ? ?Therapy/Group: Individual Therapy ? ?Curtis Sites ?04/30/2022, 6:33 AM ?

## 2022-04-30 NOTE — Progress Notes (Signed)
Pt educated on cpap, all questions at this time answered. Pt tolerating well on her home cpap. ?

## 2022-04-30 NOTE — Progress Notes (Signed)
Physical Therapy Session Note ? ?Patient Details  ?Name: Stacy Hanson ?MRN: [id number] ?Date of Birth: [birth year] ? ?Today's Date: 04/30/2022 ?PT Individual Time: [id number] and [phone] ?PT Individual Time Calculation (min): 60 min and 75 min ? ?Short Term Goals: ?Week 4:  PT Short Term Goal 1 (Week 4): =LTG due to ELOS ? ?Skilled Therapeutic Interventions/Progress Updates: Pt presented in bed agreeable to therapy. Pt states some mild ms soreness in BLE but no pain interventions needed. Pt performed supine to sit with CGA and increased time with use of bed features. PTA assisting in donning shoes and pt performed stand pivot transfer with minA for Sit to stand and CGA for ambulation. Pt transported to day room participated in Cybex Kinetron 50cm/sec x 4 min for general conditioning and per pt to "warm up" LE. Pt then participated in ambulation 63f x 1 with pt demonstrating good B foot clearance and intermittent cues for breathing. Pt maintained SpO2 >94% after ambulation on RA. Pt also performed additional ambulation x~142fincluding stepping over x 2 "thresholds" (shuffleboard sticks) with pt able to maintain heavy duty RW over thresholds. Pt transported back to room and performed ambulatory transfer to bed CGA overall. Performed sit to supine requiring modA x 1 (+2 present for safety). Pt required +2 dependent to [facility] in bed at end of session with call bell within reach and needs met.  ? ?[facility]. Pt states pain in BLE but did not rate. Session focused on standing tolerance, endurance, and ROM of LUE per pt request. [facility] supervision to allow PTA to complete peri-care. Pt then transferred to w/c with CGA. Pt transported to day room and participated in standing tolerance activities performing ball taps. Pt was CGA to stand and SBA while performing activities. Pt was able to tolerate 2  bouts of 2 min before fatiguing. Pt then ambulated [id number] RW and w/c follow. Pt transported to rehab gym and participated in finger ladder x 3. Pt required assist to bring hand to ladder but was able to tolerate x 3 bouts reaching 9th, 10th, and 11th rung respectively. Pt transported back to room and performed ambulatory transfer to bed. Pt was able to perform sit to supine with modA x 1 with PTA only managing BLE. Pt was able to perfom lateral scoot to middle of bed and with bed in trendelenburg pt was able to assist with [facility] comfort and left with call bell within reach, husband present, and current needs met.  ?  ? ?Therapy Documentation ?Precautions:  ?Precautions ?Precautions: Fall ?Restrictions ?Weight Bearing Restrictions: No ?General: ?  ?Vital Signs: ?  ?Pain: ?Pain Assessment ?Pain Scale: 0-10 ?Pain Score: 4  ?Mobility: ?  ?Locomotion : ?   ?Trunk/Postural Assessment : ?   ?Balance: ?  ?Exercises: ?  ?Other Treatments:   ? ? ? ?Therapy/Group: Individual Therapy ? ?Numair Masden ?04/30/2022, 12:53 PM  ?

## 2022-04-30 NOTE — Plan of Care (Signed)
?  Problem: Consults ?Goal: RH GENERAL PATIENT EDUCATION ?Description: See Patient Education module for education specifics. ?Outcome: Progressing ?Goal: Skin Care Protocol Initiated - if Braden Score 18 or less ?Description: If consults are not indicated, leave blank or document N/A ?Outcome: Progressing ?  ?Problem: RH BOWEL ELIMINATION ?Goal: RH STG MANAGE BOWEL WITH ASSISTANCE ?Description: STG Manage Bowel with Mod Assistance. ?Outcome: Progressing ?Goal: RH STG MANAGE BOWEL W/MEDICATION W/ASSISTANCE ?Description: STG Manage Bowel with Medication with Mod Assistance. ?Outcome: Progressing ?  ?Problem: RH BLADDER ELIMINATION ?Goal: RH STG MANAGE BLADDER WITH ASSISTANCE ?Description: STG Manage Bladder With Mod Assistance ?Outcome: Progressing ?Goal: RH STG MANAGE BLADDER WITH MEDICATION WITH ASSISTANCE ?Description: STG Manage Bladder With Medication With Mod Assistance. ?Outcome: Progressing ?  ?Problem: RH SKIN INTEGRITY ?Goal: RH STG MAINTAIN SKIN INTEGRITY WITH ASSISTANCE ?Description: STG Maintain Skin Integrity With Mod Assistance. ?Outcome: Progressing ?Goal: RH STG ABLE TO PERFORM INCISION/WOUND CARE W/ASSISTANCE ?Description: STG Able To Perform Incision/Wound Care With Mod Assistance. ?Outcome: Progressing ?  ?Problem: RH SAFETY ?Goal: RH STG ADHERE TO SAFETY PRECAUTIONS W/ASSISTANCE/DEVICE ?Description: STG Adhere to Safety Precautions With Cues and Reminders. ?Outcome: Progressing ?Goal: RH STG DECREASED RISK OF FALL WITH ASSISTANCE ?Description: STG Decreased Risk of Fall With [employer]. ?Outcome: Progressing ?  ?Problem: RH PAIN MANAGEMENT ?Goal: RH STG PAIN MANAGED AT OR BELOW PT'S PAIN GOAL ?Description: < 3 on a 0-10 pain scale. ?Outcome: Progressing ?  ?Problem: RH KNOWLEDGE DEFICIT GENERAL ?Goal: RH STG INCREASE KNOWLEDGE OF SELF CARE AFTER HOSPITALIZATION ?Description: Patient will demonstrate knowledge of medication/pain management, skin/wound care, and self-care management with  educational materials and handouts provided by staff independently at discharge. ?Outcome: Progressing ?  ?

## 2022-05-01 DIAGNOSIS — D62 Acute posthemorrhagic anemia: Secondary | ICD-10-CM | POA: Diagnosis not present

## 2022-05-01 DIAGNOSIS — M79669 Pain in unspecified lower leg: Secondary | ICD-10-CM

## 2022-05-01 DIAGNOSIS — I509 Heart failure, unspecified: Secondary | ICD-10-CM | POA: Diagnosis not present

## 2022-05-01 DIAGNOSIS — R5381 Other malaise: Secondary | ICD-10-CM | POA: Diagnosis not present

## 2022-05-01 NOTE — Progress Notes (Signed)
?                                                       PROGRESS NOTE ? ? ?Subjective/Complaints: ? ? ?Reports sciatica improving. Gradually tolerating CPAP better. ? ? ?ROS: ? ?Pt denies SOB, abd pain, CP, N/V/C/D. NO fever or chills ? ? ?Objective: ?  ?No results found. ?No results for input(s): WBC, HGB, HCT, PLT in the last 72 hours. ? ? ? ?No results for input(s): NA, K, CL, CO2, GLUCOSE, BUN, CREATININE, CALCIUM in the last 72 hours. ? ? ?Intake/Output Summary (Last 24 hours) at 05/01/2022 2110 ?Last data filed at 05/01/2022 2038 ?Gross per 24 hour  ?Intake 480 ml  ?Output 1675 ml  ?Net -1195 ml  ? ?  ? ?  ? ?Physical Exam: ? ? ? ? ? ?General: in bed, NAD ?HENT: conjugate gaze; oropharynx moist ?CV: regular rate; no JVD ?Pulmonary: CTA B/L; no W/R/R- good air movement ?GI: soft, NT, ND, (+)BS ?Psychiatric: appropriate ?Neurological: Ox3 ?Musculoskeletal: I  Patient has diffuse weakness ?Skin:Wounds are covered with Tegaderm  +1 lower extremity edema bilaterally.  Her skin is warm and dry and intact ? ? ?Vital Signs ?Blood pressure (!) 130/46, pulse 84, temperature 98.1 ?F (36.7 ?C), temperature source Oral, resp. rate 17, height 5\' 3"  (1.6 m), weight (!) 143.5 kg, SpO2 99 %. ? ? ? ?Assessment/Plan: ?1. Functional deficits which require 3+ hours per day of interdisciplinary therapy in a comprehensive inpatient rehab setting. ?Physiatrist is providing close team supervision and 24 hour management of active medical problems listed below. ?Physiatrist and rehab team continue to assess barriers to discharge/monitor patient progress toward functional and medical goals ? ?Care Tool: ? ?Bathing ?   ?Body parts bathed by patient: Right arm, Left arm, Chest, Abdomen, Right upper leg, Left upper leg, Face  ? Body parts bathed by helper: Right arm, Left arm, Buttocks, Front perineal area, Abdomen ?  ?  ?Bathing assist Assist Level: Moderate Assistance - Patient 50 - 74% ?  ?  ?Upper Body Dressing/Undressing ?Upper body  dressing   ?What is the patient wearing?: Pull over shirt ?   ?Upper body assist Assist Level: Minimal Assistance - Patient > 75% ?   ?Lower Body Dressing/Undressing ?Lower body dressing ? ? ?   ?What is the patient wearing?: Incontinence brief ? ?  ? ?Lower body assist Assist for lower body dressing: Maximal Assistance - Patient 25 - 49% ?   ? ?Toileting ?Toileting Toileting Activity did not occur Landscape architect and hygiene only):  (incontient)  ?Toileting assist Assist for toileting: 2 Helpers ?  ?  ?Transfers ?Chair/bed transfer ? ?Transfers assist ? Chair/bed transfer activity did not occur: Refused ? ?Chair/bed transfer assist level: Contact Guard/Touching assist ?  ?  ?Locomotion ?Ambulation ? ? ?Ambulation assist ? ? Ambulation activity did not occur: Safety/medical concerns ? ?Assist level: Minimal Assistance - Patient > 75% ?Assistive device: Walker-rolling ?Max distance: 32'  ? ?Walk 10 feet activity ? ? ?Assist ? Walk 10 feet activity did not occur: Safety/medical concerns ? ?Assist level: Minimal Assistance - Patient > 75% ?Assistive device: Walker-rolling  ? ?Walk 50 feet activity ? ? ?Assist Walk 50 feet with 2 turns activity did not occur: Safety/medical concerns ? ?  ?   ? ? ?Walk 150 feet activity ? ? ?  Assist Walk 150 feet activity did not occur: Safety/medical concerns ? ?  ?  ?  ? ?Walk 10 feet on uneven surface  ?activity ? ? ?Assist Walk 10 feet on uneven surfaces activity did not occur: Safety/medical concerns ? ? ?  ?   ? ?Wheelchair ? ? ? ? ?Assist Is the patient using a wheelchair?: Yes ?Type of Wheelchair: Manual ?Wheelchair activity did not occur: Refused ? ?Wheelchair assist level: Minimal Assistance - Patient > 75% ?Max wheelchair distance: 10'  ? ? ?Wheelchair 50 feet with 2 turns activity ? ? ? ?Assist ? ?  ?Wheelchair 50 feet with 2 turns activity did not occur: Refused ? ? ?   ? ?Wheelchair 150 feet activity  ? ? ? ?Assist ? Wheelchair 150 feet activity did not occur:  Refused ? ? ?   ? ?Blood pressure (!) 130/46, pulse 84, temperature 98.1 ?F (36.7 ?C), temperature source Oral, resp. rate 17, height 5\' 3"  (1.6 m), weight (!) 143.5 kg, SpO2 99 %. ? ?Medical Problem List and Plan: ?1. Functional deficits secondary to debility-for complicated medical stay-CHF exacerbation and bacteremia ?           - Patient may shower ?            -ELOS/Goals: 14 to 18 days MinA PT and OT ? D/c date 5/18 ? Continue CIR- PT, OT and SLP ? Pt will need a bariatric bed with gel overlay mattress due to venous stasis ulcers and BMI- of 56- even though she's ~ 319 lbs, not 350, her BMI due to her height, make her too large to fit in a regular hospital- we've tried and she doesn't fit.  ?2.  Antithrombotics: ?-DVT/anticoagulation: Mechanical: Sequential compression devices,   below-knee bilateral lower extremities ?           -04/09/2022 Dopplers negative for DVT's ?4/22-patient refusing to use SCDs however educated on need to use them at risk for DVT's ? 5/9- says is using some ?            -antiplatelet therapy: N/A due to GIB ?3.  Left hip and shoulder pain with myalgias, pain management: Oxycodone as needed for diffuse pain. ?04/08/2022-we will schedule tramadol 100 mg every 8 hours not every 6 hours due to renal issues-creatinine 1.31-continue oxycodone for pain as needed-was taking NSAIDs at home which caused a GI bleed. ?04/12/2022-continue tramadol scheduled and oxycodone as needed-04/13/2022-will not go home on oxycodone just tramadol ?-04/17/2022 patient is well controlled under current pain regimen ?-4/30 No issues of pain overnight. ? 5/1- pt pain controlled- will be able to go home on Tramadol- not Oxy- she's having more pain due to exercise it appears,  ?  5/2- adding Duloxetine 30 mg nightly for nerve pain- explained will take a few days to kick in - will increase next week, or can be increased as of 04/23/22 at the earliest.  ?5/3- no side effects- but slept better- change to 60 mg on 5/5 ? 5/5  Duloxetine dose increased to 60mg  daily ?5/6 patient reports pain is well managed today continue current regimen. ?5/7 Dr. Naaman Plummer decreased Duloxetine dose to 30 mg since patient had episode of dizziness yesterday, tolerating new dose well so far. ? 5/9- will try again to increase Duloxetine- if gets dizzy again, will reduce dose-  ? 5/10- sciatic nerve pain- will give Prednisone 20 mg qday x 3 days.  ? 5/11- pain doing much better after prednisone 20 mg- will do for 2 more  days.  ? 5/12- verified with pt can go home with 7 days of tramadol, but no oxycodone at d/c.  ? 5/13 Reports sciatic pain is improved ?4. Mood: LCSW to follow for evaluation and support. ?- Antipsychotic agents: Not applicable ?5. Neuropsych: This patient is capable of making decisions on her own behalf. ?6. Skin/Wound Care: Pressure relief measures. ?- Monitor for recurrent peripheral edema with increase in activity. ?Modify RF as indicated. ?   -04/08/2022 on low air loss mattress: Continue that along with 11M Tegaderm for ankles bilaterally per WOC ?   -04/12/2022 understands that she will have to remain on direct mattress ?    -Request that pressure is High ?   -04/15/22 switch to Kreg bed. ?   -04/16/2022-much improved pain with ROHO in chair and Kreg bed for coccyx pain ?   -04/17/2022 has adjusted to Kreg bed she continues to have 11M Tegaderm for ankles per WOC ?-5/6 wound is care and skin appear to be doing well.  Continue the 11M Tegaderm on ankles. ?5/7 Ankle/leg swelling continues to improve. ? 5/11- Kreg bed to be replaced tomorrow due to being broken- will send home with Gel overlay bed- ? 5/12- will need bariatric gel overlay- since her BMI is so high.  ?7. Fluids/Electrolytes/Nutrition: Routine in and outs with follow-up chemistries ?  -4/29 Continue to monitor chemistries. ?5/1- CO2 up to 35- will need to likely wean O2 as tolerated ?            5/3- will check labs in AM to see where CO2 is.  ?            5/4 Co2 31, continue current  treatment ?5/6 last CO2 is at 33 slightly slightly up from yesterday's value of 3.1 we will continue to monitor while looking at weaning oxygen as tolerated.  BUN and creatinine are slightly elevated.  We will conti

## 2022-05-01 NOTE — Plan of Care (Signed)
?  Problem: Consults ?Goal: RH GENERAL PATIENT EDUCATION ?Description: See Patient Education module for education specifics. ?Outcome: Progressing ?Goal: Skin Care Protocol Initiated - if Braden Score 18 or less ?Description: If consults are not indicated, leave blank or document N/A ?Outcome: Progressing ?  ?Problem: RH BOWEL ELIMINATION ?Goal: RH STG MANAGE BOWEL WITH ASSISTANCE ?Description: STG Manage Bowel with Mod Assistance. ?Outcome: Progressing ?Goal: RH STG MANAGE BOWEL W/MEDICATION W/ASSISTANCE ?Description: STG Manage Bowel with Medication with Mod Assistance. ?Outcome: Progressing ?  ?Problem: RH BLADDER ELIMINATION ?Goal: RH STG MANAGE BLADDER WITH ASSISTANCE ?Description: STG Manage Bladder With Mod Assistance ?Outcome: Progressing ?Goal: RH STG MANAGE BLADDER WITH MEDICATION WITH ASSISTANCE ?Description: STG Manage Bladder With Medication With Mod Assistance. ?Outcome: Progressing ?  ?Problem: RH SKIN INTEGRITY ?Goal: RH STG MAINTAIN SKIN INTEGRITY WITH ASSISTANCE ?Description: STG Maintain Skin Integrity With Mod Assistance. ?Outcome: Progressing ?Goal: RH STG ABLE TO PERFORM INCISION/WOUND CARE W/ASSISTANCE ?Description: STG Able To Perform Incision/Wound Care With Mod Assistance. ?Outcome: Progressing ?  ?Problem: RH SAFETY ?Goal: RH STG ADHERE TO SAFETY PRECAUTIONS W/ASSISTANCE/DEVICE ?Description: STG Adhere to Safety Precautions With Cues and Reminders. ?Outcome: Progressing ?Goal: RH STG DECREASED RISK OF FALL WITH ASSISTANCE ?Description: STG Decreased Risk of Fall With Mod Assistance. ?Outcome: Progressing ?  ?Problem: RH PAIN MANAGEMENT ?Goal: RH STG PAIN MANAGED AT OR BELOW PT'S PAIN GOAL ?Description: < 3 on a 0-10 pain scale. ?Outcome: Progressing ?  ?Problem: RH KNOWLEDGE DEFICIT GENERAL ?Goal: RH STG INCREASE KNOWLEDGE OF SELF CARE AFTER HOSPITALIZATION ?Description: Patient will demonstrate knowledge of medication/pain management, skin/wound care, and self-care management with  educational materials and handouts provided by staff independently at discharge. ?Outcome: Progressing ?  ?

## 2022-05-02 DIAGNOSIS — D62 Acute posthemorrhagic anemia: Secondary | ICD-10-CM | POA: Diagnosis not present

## 2022-05-02 DIAGNOSIS — M79606 Pain in leg, unspecified: Secondary | ICD-10-CM | POA: Diagnosis not present

## 2022-05-02 DIAGNOSIS — R5381 Other malaise: Secondary | ICD-10-CM | POA: Diagnosis not present

## 2022-05-02 DIAGNOSIS — I509 Heart failure, unspecified: Secondary | ICD-10-CM | POA: Diagnosis not present

## 2022-05-02 NOTE — Progress Notes (Signed)
?                                                       PROGRESS NOTE ? ? ?Subjective/Complaints: ? ? ?Reports pain is under control. Forgot to use CPAP yesterday. Husband in room. ? ? ?ROS: ? ?Pt denies SOB, abd pain, CP, N/V/C/D. No HA ? ? ?Objective: ?  ?No results found. ?No results for input(s): WBC, HGB, HCT, PLT in the last 72 hours. ? ? ? ?No results for input(s): NA, K, CL, CO2, GLUCOSE, BUN, CREATININE, CALCIUM in the last 72 hours. ? ? ?Intake/Output Summary (Last 24 hours) at 05/02/2022 1621 ?Last data filed at 05/02/2022 1400 ?Gross per 24 hour  ?Intake 680 ml  ?Output 1325 ml  ?Net -645 ml  ? ?  ? ?  ? ?Physical Exam: ? ? ? ? ? ?General: in bed, NAD ?HENT: conjugate gaze; oropharynx moist ?CV: regular rate; no JVD ?Pulmonary: CTA B/L; no W/R/R- good air movement, normal effort ?GI: soft, NT, ND, (+)BS ?Psychiatric: appropriate, pleasant ?Neurological: Ox3 ?Musculoskeletal: I  Patient has diffuse weakness ?Skin:Wounds are covered with Tegaderm  +1 lower extremity edema bilaterally.  Her skin is warm and dry and intact ? ? ?Vital Signs ?Blood pressure (!) 125/55, pulse 77, temperature 98.2 ?F (36.8 ?C), temperature source Oral, resp. rate 16, height 5\' 3"  (1.6 m), weight (!) 140.1 kg, SpO2 93 %. ? ? ? ?Assessment/Plan: ?1. Functional deficits which require 3+ hours per day of interdisciplinary therapy in a comprehensive inpatient rehab setting. ?Physiatrist is providing close team supervision and 24 hour management of active medical problems listed below. ?Physiatrist and rehab team continue to assess barriers to discharge/monitor patient progress toward functional and medical goals ? ?Care Tool: ? ?Bathing ?   ?Body parts bathed by patient: Right arm, Left arm, Chest, Abdomen, Right upper leg, Left upper leg, Face  ? Body parts bathed by helper: Right arm, Left arm, Buttocks, Front perineal area, Abdomen ?  ?  ?Bathing assist Assist Level: Moderate Assistance - Patient 50 - 74% ?  ?  ?Upper Body  Dressing/Undressing ?Upper body dressing   ?What is the patient wearing?: Pull over shirt ?   ?Upper body assist Assist Level: Minimal Assistance - Patient > 75% ?   ?Lower Body Dressing/Undressing ?Lower body dressing ? ? ?   ?What is the patient wearing?: Incontinence brief ? ?  ? ?Lower body assist Assist for lower body dressing: Maximal Assistance - Patient 25 - 49% ?   ? ?Toileting ?Toileting Toileting Activity did not occur Landscape architect and hygiene only):  (incontient)  ?Toileting assist Assist for toileting: 2 Helpers ?  ?  ?Transfers ?Chair/bed transfer ? ?Transfers assist ? Chair/bed transfer activity did not occur: Refused ? ?Chair/bed transfer assist level: Contact Guard/Touching assist ?  ?  ?Locomotion ?Ambulation ? ? ?Ambulation assist ? ? Ambulation activity did not occur: Safety/medical concerns ? ?Assist level: Minimal Assistance - Patient > 75% ?Assistive device: Walker-rolling ?Max distance: 62'  ? ?Walk 10 feet activity ? ? ?Assist ? Walk 10 feet activity did not occur: Safety/medical concerns ? ?Assist level: Minimal Assistance - Patient > 75% ?Assistive device: Walker-rolling  ? ?Walk 50 feet activity ? ? ?Assist Walk 50 feet with 2 turns activity did not occur: Safety/medical concerns ? ?  ?   ? ? ?  Walk 150 feet activity ? ? ?Assist Walk 150 feet activity did not occur: Safety/medical concerns ? ?  ?  ?  ? ?Walk 10 feet on uneven surface  ?activity ? ? ?Assist Walk 10 feet on uneven surfaces activity did not occur: Safety/medical concerns ? ? ?  ?   ? ?Wheelchair ? ? ? ? ?Assist Is the patient using a wheelchair?: Yes ?Type of Wheelchair: Manual ?Wheelchair activity did not occur: Refused ? ?Wheelchair assist level: Minimal Assistance - Patient > 75% ?Max wheelchair distance: 10'  ? ? ?Wheelchair 50 feet with 2 turns activity ? ? ? ?Assist ? ?  ?Wheelchair 50 feet with 2 turns activity did not occur: Refused ? ? ?   ? ?Wheelchair 150 feet activity  ? ? ? ?Assist ? Wheelchair 150 feet  activity did not occur: Refused ? ? ?   ? ?Blood pressure (!) 125/55, pulse 77, temperature 98.2 ?F (36.8 ?C), temperature source Oral, resp. rate 16, height 5\' 3"  (1.6 m), weight (!) 140.1 kg, SpO2 93 %. ? ?Medical Problem List and Plan: ?1. Functional deficits secondary to debility-for complicated medical stay-CHF exacerbation and bacteremia ?           - Patient may shower ?            -ELOS/Goals: 14 to 18 days MinA PT and OT ? D/c date 5/18 ? Continue CIR- PT, OT and SLP ? Pt will need a bariatric bed with gel overlay mattress due to venous stasis ulcers and BMI- of 56- even though she's ~ 319 lbs, not 350, her BMI due to her height, make her too large to fit in a regular hospital- we've tried and she doesn't fit.  ? -Pt reports should ROM improved during OT today ?2.  Antithrombotics: ?-DVT/anticoagulation: Mechanical: Sequential compression devices,   below-knee bilateral lower extremities ?           -04/09/2022 Dopplers negative for DVT's ?4/22-patient refusing to use SCDs however educated on need to use them at risk for DVT's ? 5/9- says is using some ?            -antiplatelet therapy: N/A due to GIB ?3.  Left hip and shoulder pain with myalgias, pain management: Oxycodone as needed for diffuse pain. ?04/08/2022-we will schedule tramadol 100 mg every 8 hours not every 6 hours due to renal issues-creatinine 1.31-continue oxycodone for pain as needed-was taking NSAIDs at home which caused a GI bleed. ?04/12/2022-continue tramadol scheduled and oxycodone as needed-04/13/2022-will not go home on oxycodone just tramadol ?-04/17/2022 patient is well controlled under current pain regimen ?-4/30 No issues of pain overnight. ? 5/1- pt pain controlled- will be able to go home on Tramadol- not Oxy- she's having more pain due to exercise it appears,  ?  5/2- adding Duloxetine 30 mg nightly for nerve pain- explained will take a few days to kick in - will increase next week, or can be increased as of 04/23/22 at the earliest.   ?5/3- no side effects- but slept better- change to 60 mg on 5/5 ? 5/5 Duloxetine dose increased to 60mg  daily ?5/6 patient reports pain is well managed today continue current regimen. ?5/7 Dr. Naaman Plummer decreased Duloxetine dose to 30 mg since patient had episode of dizziness yesterday, tolerating new dose well so far. ? 5/9- will try again to increase Duloxetine- if gets dizzy again, will reduce dose-  ? 5/10- sciatic nerve pain- will give Prednisone 20 mg qday x 3 days.  ?  5/11- pain doing much better after prednisone 20 mg- will do for 2 more days.  ? 5/12- verified with pt can go home with 7 days of tramadol, but no oxycodone at d/c.  ? 5/14  pain continues to be improved, continue current ?4. Mood: LCSW to follow for evaluation and support. ?- Antipsychotic agents: Not applicable ?5. Neuropsych: This patient is capable of making decisions on her own behalf. ?6. Skin/Wound Care: Pressure relief measures. ?- Monitor for recurrent peripheral edema with increase in activity. ?Modify RF as indicated. ?   -04/08/2022 on low air loss mattress: Continue that along with 37M Tegaderm for ankles bilaterally per WOC ?   -04/12/2022 understands that she will have to remain on direct mattress ?    -Request that pressure is High ?   -04/15/22 switch to Kreg bed. ?   -04/16/2022-much improved pain with ROHO in chair and Kreg bed for coccyx pain ?   -04/17/2022 has adjusted to Kreg bed she continues to have 37M Tegaderm for ankles per WOC ?-5/6 wound is care and skin appear to be doing well.  Continue the 37M Tegaderm on ankles. ?5/7 Ankle/leg swelling continues to improve. ? 5/11- Kreg bed to be replaced tomorrow due to being broken- will send home with Gel overlay bed- ? 5/12- will need bariatric gel overlay- since her BMI is so high.  ?7. Fluids/Electrolytes/Nutrition: Routine in and outs with follow-up chemistries ?  -4/29 Continue to monitor chemistries. ?5/1- CO2 up to 35- will need to likely wean O2 as tolerated ?            5/3-  will check labs in AM to see where CO2 is.  ?            5/4 Co2 31, continue current treatment ?5/6 last CO2 is at 33 slightly slightly up from yesterday's value of 3.1 we will continue to monitor while look

## 2022-05-02 NOTE — Plan of Care (Signed)
?  Problem: Consults ?Goal: RH GENERAL PATIENT EDUCATION ?Description: See Patient Education module for education specifics. ?Outcome: Progressing ?Goal: Skin Care Protocol Initiated - if Braden Score 18 or less ?Description: If consults are not indicated, leave blank or document N/A ?Outcome: Progressing ?  ?Problem: RH BOWEL ELIMINATION ?Goal: RH STG MANAGE BOWEL WITH ASSISTANCE ?Description: STG Manage Bowel with Mod Assistance. ?Outcome: Progressing ?Goal: RH STG MANAGE BOWEL W/MEDICATION W/ASSISTANCE ?Description: STG Manage Bowel with Medication with Mod Assistance. ?Outcome: Progressing ?  ?Problem: RH BLADDER ELIMINATION ?Goal: RH STG MANAGE BLADDER WITH ASSISTANCE ?Description: STG Manage Bladder With Mod Assistance ?Outcome: Progressing ?Goal: RH STG MANAGE BLADDER WITH MEDICATION WITH ASSISTANCE ?Description: STG Manage Bladder With Medication With Mod Assistance. ?Outcome: Progressing ?  ?Problem: RH SKIN INTEGRITY ?Goal: RH STG MAINTAIN SKIN INTEGRITY WITH ASSISTANCE ?Description: STG Maintain Skin Integrity With Mod Assistance. ?Outcome: Progressing ?Goal: RH STG ABLE TO PERFORM INCISION/WOUND CARE W/ASSISTANCE ?Description: STG Able To Perform Incision/Wound Care With Mod Assistance. ?Outcome: Progressing ?  ?Problem: RH SAFETY ?Goal: RH STG ADHERE TO SAFETY PRECAUTIONS W/ASSISTANCE/DEVICE ?Description: STG Adhere to Safety Precautions With Cues and Reminders. ?Outcome: Progressing ?Goal: RH STG DECREASED RISK OF FALL WITH ASSISTANCE ?Description: STG Decreased Risk of Fall With Mod Assistance. ?Outcome: Progressing ?  ?Problem: RH PAIN MANAGEMENT ?Goal: RH STG PAIN MANAGED AT OR BELOW PT'S PAIN GOAL ?Description: < 3 on a 0-10 pain scale. ?Outcome: Progressing ?  ?Problem: RH KNOWLEDGE DEFICIT GENERAL ?Goal: RH STG INCREASE KNOWLEDGE OF SELF CARE AFTER HOSPITALIZATION ?Description: Patient will demonstrate knowledge of medication/pain management, skin/wound care, and self-care management with  educational materials and handouts provided by staff independently at discharge. ?Outcome: Progressing ?  ?

## 2022-05-02 NOTE — Progress Notes (Signed)
Occupational Therapy Session Note ? ?Patient Details  ?Name: Stacy Hanson ?MRN: [id number] ?Date of Birth: 1951-03-31 ? ?Today's Date: 05/02/2022 ?OT Individual Time: 1105-1200 ?OT Individual Time Calculation (min): 55 min  ? ? ?Short Term Goals: ?Week 4:  OT Short Term Goal 1 (Week 4): STG=LTG 2/2 ELOS ? ?Skilled Therapeutic Interventions/Progress Updates:  ?  Pt resting in bed upon arrival. Pt reports soreness and "tightness" from activities on 5/12. Initial focus on AROM with 1# bar tapping small beach ball - 3x10 and 1x15 - with rest breaks. BUE PROM/AAROM/AROM. Shoulder flexion, abduction, and adduction with pt report of increased AROM and decreased tightness. Pt remained in bed with all needs within reach. Pt's husband present.  ? ?Therapy Documentation ?Precautions:  ?Precautions ?Precautions: Fall ?Restrictions ?Weight Bearing Restrictions: No ? ?Pain: ?Pt reports BUE soreness and "tighthness"; stretching and repositioning ? ? ?Therapy/Group: Individual Therapy ? ?Leroy Libman ?05/02/2022, 12:03 PM ?

## 2022-05-03 DIAGNOSIS — R5381 Other malaise: Secondary | ICD-10-CM | POA: Diagnosis not present

## 2022-05-03 LAB — BASIC METABOLIC PANEL
Anion gap: 11 (ref 5–15)
BUN: 45 mg/dL — ABNORMAL HIGH (ref 8–23)
CO2: 28 mmol/L (ref 22–32)
Calcium: 9.5 mg/dL (ref 8.9–10.3)
Chloride: 100 mmol/L (ref 98–111)
Creatinine, Ser: 1.62 mg/dL — ABNORMAL HIGH (ref 0.44–1.00)
GFR, Estimated: 33 mL/min — ABNORMAL LOW (ref >60)
Glucose, Bld: 127 mg/dL — ABNORMAL HIGH (ref 70–99)
Potassium: 3.9 mmol/L (ref 3.5–5.1)
Sodium: 139 mmol/L (ref 135–145)

## 2022-05-03 LAB — CBC
HCT: 39 % (ref 36.0–46.0)
Hemoglobin: 12.2 g/dL (ref 12.0–15.0)
MCH: 27 pg (ref 26.0–34.0)
MCHC: 31.3 g/dL (ref 30.0–36.0)
MCV: 86.3 fL (ref 80.0–100.0)
Platelets: 361 10*3/uL (ref 150–400)
RBC: 4.52 MIL/uL (ref 3.87–5.11)
RDW: 16.7 % — ABNORMAL HIGH (ref 11.5–15.5)
WBC: 10.5 10*3/uL (ref 4.0–10.5)
nRBC: 0 % (ref 0.0–0.2)

## 2022-05-03 MED ORDER — FUROSEMIDE 40 MG PO TABS
40.0000 mg | ORAL_TABLET | Freq: Every day | ORAL | Status: DC
Start: 2022-05-04 — End: 2022-05-04
  Administered 2022-05-04: 40 mg via ORAL
  Filled 2022-05-03: qty 1

## 2022-05-03 NOTE — Progress Notes (Addendum)
Patient ID: Stacy Hanson, female   DOB: 04-Oct-1951, 70 y.o.   MRN: [id number] ? ?SW ordered bariatric hospital bed with gel overlay with [place] via parachute.  ? ?RW and 3in1 BSC delivered to room.  ? ?SW met with pt and pt husband in room to discuss DME delivery. Reports they received phone call about delivery already. Hospital bed to be delivered on Wednesday morning. SW asked pt and husband to confirm if ambulance transport will be needed on Thursday.  ? ?[name], MSW, LCSWA ?Office: [phone] ?Cell: [phone] ?Fax: [phone]  ?

## 2022-05-03 NOTE — Progress Notes (Signed)
Occupational Therapy Session Note ? ?Patient Details  ?Name: Stacy Hanson ?MRN: [id number] ?Date of Birth: 04-13-1951 ? ?Today's Date: 05/03/2022 ?OT Individual Time: [id number] ?OT Individual Time Calculation (min): 56 min  ? ? ?Short Term Goals: ?Week 4:  OT Short Term Goal 1 (Week 4): STG=LTG 2/2 ELOS ? ?Skilled Therapeutic Interventions/Progress Updates:  ?  Pt resting in bed. Pt agreeable to getting OOB and transferring to w/c. Supine>sit EOB with HOB elevated and using bed rails-supervision. Sit<>stand and stand pivot transfer to w/c with CGA. Pt engaged in Outlook activities with bounceback trampoline with soccer ball and 1kg ball-3x10 each. Pt used 2# weight bar to tap soccer ball 3x10. PROM/stretching of BUE/shoulders to facilitate increased AROM. Pt remained in w/c with all needs within reach.  ? ?Therapy Documentation ?Precautions:  ?Precautions ?Precautions: Fall ?Restrictions ?Weight Bearing Restrictions: No ? ?Pain: ?Pt reports BUE/shoulders "still sore"; stretching and PROM/activity ? ?Therapy/Group: Individual Therapy ? ?Leroy Libman ?05/03/2022, 10:30 AM ?

## 2022-05-03 NOTE — Progress Notes (Signed)
?                                                       PROGRESS NOTE ? ? ?Subjective/Complaints: ?No new complaints this morning ? ? ?ROS: ? ?Pt denies SOB, abd pain, CP, N/V/C/D. No HA ? ? ?Objective: ?  ?No results found. ?Recent Labs  ?  05/03/22 ?1221  ?WBC 10.5  ?HGB 12.2  ?HCT 39.0  ?PLT 361  ? ? ? ?Recent Labs  ?  05/03/22 ?1221  ?NA 139  ?K 3.9  ?CL 100  ?CO2 28  ?GLUCOSE 127*  ?BUN 45*  ?CREATININE 1.62*  ?CALCIUM 9.5  ? ? ? ?Intake/Output Summary (Last 24 hours) at 05/03/2022 1510 ?Last data filed at 05/03/2022 1257 ?Gross per 24 hour  ?Intake 420 ml  ?Output 600 ml  ?Net -180 ml  ?  ? ?  ? ?Physical Exam: ?Gen: no distress, normal appearing ?HEENT: oral mucosa pink and moist, NCAT ?Cardio: Reg rate ?Chest: normal effort, normal rate of breathing ?Abd: soft, non-distended ?Ext: no edema ?Psych: pleasant, normal affect ?Skin: intact ?Musculoskeletal: I  Patient has diffuse weakness ?Skin:Wounds are covered with Tegaderm  +1 lower extremity edema bilaterally.  Her skin is warm and dry and intact ? ? ?Vital Signs ?Blood pressure (!) 115/47, pulse 83, temperature 98.1 ?F (36.7 ?C), temperature source Oral, resp. rate 16, height 5\' 3"  (1.6 m), weight (!) 139.8 kg, SpO2 93 %. ? ? ? ?Assessment/Plan: ?1. Functional deficits which require 3+ hours per day of interdisciplinary therapy in a comprehensive inpatient rehab setting. ?Physiatrist is providing close team supervision and 24 hour management of active medical problems listed below. ?Physiatrist and rehab team continue to assess barriers to discharge/monitor patient progress toward functional and medical goals ? ?Care Tool: ? ?Bathing ?   ?Body parts bathed by patient: Right arm, Left arm, Chest, Abdomen, Right upper leg, Left upper leg, Face  ? Body parts bathed by helper: Right arm, Left arm, Buttocks, Front perineal area, Abdomen ?  ?  ?Bathing assist Assist Level: Moderate Assistance - Patient 50 - 74% ?  ?  ?Upper Body Dressing/Undressing ?Upper body  dressing   ?What is the patient wearing?: Pull over shirt ?   ?Upper body assist Assist Level: Minimal Assistance - Patient > 75% ?   ?Lower Body Dressing/Undressing ?Lower body dressing ? ? ?   ?What is the patient wearing?: Incontinence brief ? ?  ? ?Lower body assist Assist for lower body dressing: Maximal Assistance - Patient 25 - 49% ?   ? ?Toileting ?Toileting Toileting Activity did not occur Landscape architect and hygiene only):  (incontient)  ?Toileting assist Assist for toileting: 2 Helpers ?  ?  ?Transfers ?Chair/bed transfer ? ?Transfers assist ? Chair/bed transfer activity did not occur: Refused ? ?Chair/bed transfer assist level: Contact Guard/Touching assist ?  ?  ?Locomotion ?Ambulation ? ? ?Ambulation assist ? ? Ambulation activity did not occur: Safety/medical concerns ? ?Assist level: Minimal Assistance - Patient > 75% ?Assistive device: Walker-rolling ?Max distance: 32'  ? ?Walk 10 feet activity ? ? ?Assist ? Walk 10 feet activity did not occur: Safety/medical concerns ? ?Assist level: Minimal Assistance - Patient > 75% ?Assistive device: Walker-rolling  ? ?Walk 50 feet activity ? ? ?Assist Walk 50 feet with 2 turns activity did not occur:  Safety/medical concerns ? ?  ?   ? ? ?Walk 150 feet activity ? ? ?Assist Walk 150 feet activity did not occur: Safety/medical concerns ? ?  ?  ?  ? ?Walk 10 feet on uneven surface  ?activity ? ? ?Assist Walk 10 feet on uneven surfaces activity did not occur: Safety/medical concerns ? ? ?  ?   ? ?Wheelchair ? ? ? ? ?Assist Is the patient using a wheelchair?: Yes ?Type of Wheelchair: Manual ?Wheelchair activity did not occur: Refused ? ?Wheelchair assist level: Minimal Assistance - Patient > 75% ?Max wheelchair distance: 10'  ? ? ?Wheelchair 50 feet with 2 turns activity ? ? ? ?Assist ? ?  ?Wheelchair 50 feet with 2 turns activity did not occur: Refused ? ? ?   ? ?Wheelchair 150 feet activity  ? ? ? ?Assist ? Wheelchair 150 feet activity did not occur:  Refused ? ? ?   ? ?Blood pressure (!) 115/47, pulse 83, temperature 98.1 ?F (36.7 ?C), temperature source Oral, resp. rate 16, height 5\' 3"  (1.6 m), weight (!) 139.8 kg, SpO2 93 %. ? ?Medical Problem List and Plan: ?1. Functional deficits secondary to debility-for complicated medical stay-CHF exacerbation and bacteremia ?           - Patient may shower ?            -ELOS/Goals: 14 to 18 days MinA PT and OT ? D/c date 5/18 ? Continue CIR- PT, OT and SLP ? Pt will need a bariatric bed with gel overlay mattress due to venous stasis ulcers and BMI- of 56- even though she's ~ 319 lbs, not 350, her BMI due to her height, make her too large to fit in a regular hospital- we've tried and she doesn't fit.  ? -Pt reports should ROM improved during OT today ?2.  Antithrombotics: ?-DVT/anticoagulation: Mechanical: Sequential compression devices,   below-knee bilateral lower extremities ?           -04/09/2022 Dopplers negative for DVT's ?4/22-patient refusing to use SCDs however educated on need to use them at risk for DVT's ? 5/9- says is using some ?            -antiplatelet therapy: N/A due to GIB ?3.  Left hip and shoulder pain with myalgias, pain management: Oxycodone as needed for diffuse pain. ?04/08/2022-we will schedule tramadol 100 mg every 8 hours not every 6 hours due to renal issues-creatinine 1.31-continue oxycodone for pain as needed-was taking NSAIDs at home which caused a GI bleed. ?04/12/2022-continue tramadol scheduled and oxycodone as needed-04/13/2022-will not go home on oxycodone just tramadol ?-04/17/2022 patient is well controlled under current pain regimen ?-4/30 No issues of pain overnight. ? 5/1- pt pain controlled- will be able to go home on Tramadol- not Oxy- she's having more pain due to exercise it appears,  ?  5/2- adding Duloxetine 30 mg nightly for nerve pain- explained will take a few days to kick in - will increase next week, or can be increased as of 04/23/22 at the earliest.  ?5/3- no side effects-  but slept better- change to 60 mg on 5/5 ? 5/5 Duloxetine dose increased to 60mg  daily ?5/6 patient reports pain is well managed today continue current regimen. ?5/7 Dr. Naaman Plummer decreased Duloxetine dose to 30 mg since patient had episode of dizziness yesterday, tolerating new dose well so far. ? 5/9- will try again to increase Duloxetine- if gets dizzy again, will reduce dose-  ? 5/10- sciatic nerve pain-  will give Prednisone 20 mg qday x 3 days.  ? 5/11- pain doing much better after prednisone 20 mg- will do for 2 more days.  ? 5/12- verified with pt can go home with 7 days of tramadol, but no oxycodone at d/c.  ? 5/14  pain continues to be improved, continue current ?4. Mood: LCSW to follow for evaluation and support. ?- Antipsychotic agents: Not applicable ?5. Neuropsych: This patient is capable of making decisions on her own behalf. ?6. Skin/Wound Care: Pressure relief measures. ?- Monitor for recurrent peripheral edema with increase in activity. ?Modify RF as indicated. ?   -04/08/2022 on low air loss mattress: Continue that along with 30M Tegaderm for ankles bilaterally per WOC ?   -04/12/2022 understands that she will have to remain on direct mattress ?    -Request that pressure is High ?   -04/15/22 switch to Kreg bed. ?   -04/16/2022-much improved pain with ROHO in chair and Kreg bed for coccyx pain ?   -04/17/2022 has adjusted to Kreg bed she continues to have 30M Tegaderm for ankles per WOC ?-5/6 wound is care and skin appear to be doing well.  Continue the 30M Tegaderm on ankles. ?5/7 Ankle/leg swelling continues to improve. ? 5/11- Kreg bed to be replaced tomorrow due to being broken- will send home with Gel overlay bed- ? 5/12- will need bariatric gel overlay- since her BMI is so high.  ?7. Fluids/Electrolytes/Nutrition: Routine in and outs with follow-up chemistries ?  -4/29 Continue to monitor chemistries. ?5/1- CO2 up to 35- will need to likely wean O2 as tolerated ?            5/3- will check labs in AM to  see where CO2 is.  ?            5/4 Co2 31, continue current treatment ?5/6 last CO2 is at 33 slightly slightly up from yesterday's value of 3.1 we will continue to monitor while looking at weaning oxygen as to

## 2022-05-03 NOTE — Progress Notes (Signed)
Physical Therapy Session Note ? ?Patient Details  ?Name: Stacy Hanson ?MRN: [id number] ?Date of Birth: 1951/03/23 ? ?Today's Date: 05/03/2022 ?PT Individual Time: 1100-1200; 1415-1530 ?PT Individual Time Calculation (min): 60 min and 75 min ? ?Short Term Goals: ?Week 4:  PT Short Term Goal 1 (Week 4): =LTG due to ELOS ? ?Skilled Therapeutic Interventions/Progress Updates:  ?  Session 1: ?Pt received seated in w/c in room, agreeable to PT session. Pt reports some soreness in BUE from OT sessions as well as numbness in BLE from neuropathy and sitting up in w/c between therapy sessions. Sit to stand with min A to RW during session with improved hand placement during transfer. Trialed bariatric RW that pt will d/c home with, adjusted height of RW and switched out feet of RW for improved mobility. Ambulation 4 x 25 ft with RW and Supervision. Demonstrated how to safely ascend/descend ramp with RW. Pt able to perform return demonstration with use of RW and CGA, some assist needed to stabilize RW when descending ramp. Pt requests to return to bed at end of session. Sit to supine mod A needed for BLE management. Pt left seated in bed with needs in reach, husband present. ? ?Session 2: ?Pt received seated in bed, agreeable to PT session. Pt reports ongoing numbness in her feet, no other complaints of pain. Seated in bed to sitting EOB with Supervision, use of bedrail and HOB elevated. Sit to stand to rollator with min A. Stand pivot transfer bed to w/c with rollator and CGA. Trial gait with use of rollator x 30 ft with CGA for balance. Pt exhibits good safety and balance during gait with use of rollator. Pt reports urgent need to have a BM following gait. Toilet transfer to [facility] with rollator and CGA. Pt able to continently void while seated on BSC, see Flowsheet for details. Pt is dependent for pericare and for donning of new brief. Standing alt L/R 1" step-taps x 2 x 8 reps, 1 x 6 reps with rollator and CGA for balance.  Pt continues to exhibit some difficulty with L knee and hip flexion with some circumduction noted. Sidesteps L/R 2 x 10 ft each direction with RW and CGA for balance. Pt returned to bed at end of session with mod A needed for BLE management. Pt left seated in bed with needs in reach, husband present. ? ?Therapy Documentation ?Precautions:  ?Precautions ?Precautions: Fall ?Restrictions ?Weight Bearing Restrictions: No ? ? ? ? ? ? ?Therapy/Group: Individual Therapy ? ? ?[name], PT, DPT, CSRS ?05/03/2022, 2:13 PM  ?

## 2022-05-04 DIAGNOSIS — D62 Acute posthemorrhagic anemia: Secondary | ICD-10-CM | POA: Diagnosis not present

## 2022-05-04 DIAGNOSIS — R5381 Other malaise: Secondary | ICD-10-CM | POA: Diagnosis not present

## 2022-05-04 DIAGNOSIS — M79606 Pain in leg, unspecified: Secondary | ICD-10-CM | POA: Diagnosis not present

## 2022-05-04 DIAGNOSIS — I509 Heart failure, unspecified: Secondary | ICD-10-CM | POA: Diagnosis not present

## 2022-05-04 DIAGNOSIS — N179 Acute kidney failure, unspecified: Secondary | ICD-10-CM

## 2022-05-04 LAB — BASIC METABOLIC PANEL
Anion gap: 8 (ref 5–15)
BUN: 48 mg/dL — ABNORMAL HIGH (ref 8–23)
CO2: 30 mmol/L (ref 22–32)
Calcium: 9.3 mg/dL (ref 8.9–10.3)
Chloride: 101 mmol/L (ref 98–111)
Creatinine, Ser: 1.45 mg/dL — ABNORMAL HIGH (ref 0.44–1.00)
GFR, Estimated: 38 mL/min — ABNORMAL LOW (ref >60)
Glucose, Bld: 111 mg/dL — ABNORMAL HIGH (ref 70–99)
Potassium: 4.2 mmol/L (ref 3.5–5.1)
Sodium: 139 mmol/L (ref 135–145)

## 2022-05-04 MED ORDER — FUROSEMIDE 40 MG PO TABS: 40.0000 mg | ORAL_TABLET | Freq: Every day | ORAL | Status: DC

## 2022-05-04 NOTE — Progress Notes (Signed)
Pt on home cpap. No resp distress noted.  ?

## 2022-05-04 NOTE — Progress Notes (Addendum)
Physical Therapy Session Note ? ?Patient Details  ?Name: Stacy Hanson ?MRN: [id number] ?Date of Birth: 01/30/1951 ? ?Today's Date: 05/04/2022 ?PT Individual Time: 0900-1000; 1300-1400 ?PT Individual Time Calculation (min): 60 min and 60 min ? ?Short Term Goals: ?Week 4:  PT Short Term Goal 1 (Week 4): =LTG due to ELOS ? ?Skilled Therapeutic Interventions/Progress Updates:  ?  Session 1: ?Pt received seated in bed, agreeable to PT session. Pt reports pain in RLE this AM, not rated. Utilized repositioning and rest breaks as needed during session for pain management. Seated in bed to sitting EOB with Supervision, HOB elevated and use of bedrail. Sit to stand and transfer with RW and CGA. Seated BLE strengthening on Kinetron level 20 cm/sec with progression to 10 cm/sec, 6 x 30 sec bouts of hip/knee extension for LE warm-up. Standing RLE hip flexion, extension, and abduction x 10-15 reps while standing with RW, LLE level toe-taps but pt reports poor tolerance for stance on RLE due to pain this AM. Ambulation 2 x 20 ft with RW at Supervision level. Pt exhibits fair tolerance for ambulation this date due to RLE pain. Pt left seated in w/c in room with needs in reach at end of session for OT session next. ? ?Session 2: ?Pt received standing EOB with RW and assist from husband in order to don clothes for transport outside for car transfer. Stand pivot transfer to w/c with RW and Supervision. Pt also requesting pain medication from nursing due to ongoing RLE pain, able to receive at beginning of session. Dependent transport via w/c outdoors for time and energy conservation. Sit to stand with CGA to min A from w/c seat to RW. Pt able to perform car transfer with min A and RW with some assist needed to lift RUE to handle inside car and to lift RLE into car. Pt able to stand from car seat to RW with Supervision and transport back to w/c. Pt and her husband encouraged by her ability to safely perform car transfer this date!  Ascend/descend one 1" curb step with RW and min A for balance, x 2 reps, in order to simulate getting in/out of tub at home. Discussed how to safely navigate tub at home. Pt and her husband with no further questions, left seated in w/c in room with needs in reach for OT session next. ? ?Therapy Documentation ?Precautions:  ?Precautions ?Precautions: Fall ?Restrictions ?Weight Bearing Restrictions: No ? ? ? ? ? ? ?Therapy/Group: Individual Therapy ? ? ?Peter Congo, PT, DPT, CSRS ?05/04/2022, 3:41 PM  ?

## 2022-05-04 NOTE — Progress Notes (Signed)
Pt and spouse nursing education complete. Discussed follow-up visit with PCP, recording BP at home, BP medications. MyChart link was sent to pt via phone. No further questions at this time.  ?

## 2022-05-04 NOTE — Patient Care Conference (Signed)
Inpatient RehabilitationTeam Conference and Plan of Care Update Date: 05/04/2022   Time: 11:13 AM    Patient Name: Stacy Hanson      Medical Record Number: [id number]  Date of Birth: 04/19/1951 Sex: Female         Room/Bed: 4W14C/4W14C-01 Payor Info: Payor: MEDICARE / Plan: MEDICARE PART A AND B / Product Type: *No Product type* /    Admit Date/Time:  04/07/2022  2:08 PM  Primary Diagnosis:  [facility] [facility] Problems: Principal Problem:   Debility    Expected Discharge Date: Expected Discharge Date: 05/06/22  Team Members Present: Physician leading conference: Dr. Jennye Boroughs Social Worker Present: [name], [place] Nurse Present: Dorthula Nettles, RN PT Present: [name], PT OT Present: Roanna Epley, COTA;Jennifer Tamala Julian, OT PPS Coordinator present : Gunnar Fusi, SLP     Current Status/Progress Goal Weekly Team Focus  Bowel/Bladder   continent b/b  remain continent  toilet as needed   Swallow/Nutrition/ Hydration             ADL's   functional transfers-CGA; dressing-tot A for brief, min A/supervision for gown; toileting-tot A  transfers-supervison; UB baitng/dressing-min A; LB dressing-mod A; toileting-max A  bed mobility, sitting balance, tranfsers, education   Mobility   min A rolling, Supervision supine to sit with HOB elevated/mod A sit to supine for LE management, Supervision to min A to stand to RW, gait up to 83 ft with RW and/or rollator Supervision  upgraded goals to mod A bed mobility, Supervision transfers, Supervision gait x 100 ft  d/c planning, family edu, endurance   Communication             Safety/Cognition/ Behavioral Observations            Pain   hip and foot pain  < 3  assess pain q 4hr and prn   Skin   bilateral lower extremity dressing remain in place  no new breakdown  assess skin q shift and prn     Discharge Planning:  D/c to home with pt husband. Pt husband woud like for pt to be as independent as possible. Pt  home BiPAP machine has been set up by [employer]. HHA- [facility] for HHPT/OT/SN (wound care)/aide. DME- bariatric RW and hospital bed ordered. Pt declined w/c as not able to fit thorugh hallways, and BSC (already has one).   Team Discussion: Anxious. Pain improving. Edema stable. Labs stable. Weight stable. To complete car transfer today. Family education complete. Continent B/B, reports hip and foot pain. Leg wounds stable. Has HH, RW, BSC, hospital bed in place. On target for discharge.  Patient on target to meet rehab goals: yes, on target for discharge 05/06/2022.  *See Care Plan and progress notes for long and short-term goals.   Revisions to Treatment Plan:  Finalizing discharge plans and medicatons.   Teaching Needs: Family education completed.   Current Barriers to Discharge: No current barriers  Possible Resolutions to Barriers: All barriers addressed     Medical Summary Current Status: debility, CHF, leg pain, bacteremia, anemia, CAF,  edema, CHF  Barriers to Discharge: Medical stability;Other (comments);Medication compliance  Barriers to Discharge Comments: debility, CHF, leg ain, bacteremia, anemia, CAF,  edema, CHF Possible Resolutions to [employer] Focus: continue pain medications, follow weights and labs, CPAP   Continued Need for Acute Rehabilitation Level of Care: The patient requires daily medical management by a physician with specialized training in physical medicine and rehabilitation for the following reasons: Direction of a multidisciplinary physical  rehabilitation program to maximize functional independence : Yes Medical management of patient stability for increased activity during participation in an intensive rehabilitation regime.: Yes Analysis of laboratory values and/or radiology reports with any subsequent need for medication adjustment and/or medical intervention. : Yes   I attest that I was present, lead the team conference, and concur with the  assessment and plan of the team.   Cristi Loron 05/04/2022, 3:17 PM

## 2022-05-04 NOTE — Progress Notes (Signed)
Patient ID: Stacy Hanson, female   DOB: Jan 03, 1951, 71 y.o.   MRN: [id number] ? ?Per PT, pt did well with car transfer so ambulance transport not needed. SW cancelled ambulance transport home with [place] ambulance.  ? ?[name], MSW, LCSWA ?Office: [phone] ?Cell: [phone] ?Fax: [phone]  ?

## 2022-05-04 NOTE — Progress Notes (Signed)
Occupational Therapy Session Note ? ?Patient Details  ?Name: Stacy Hanson ?MRN: [id number] ?Date of Birth: Jun 09, 1951 ? ?Today's Date: 05/04/2022 ?OT Individual Time: 1400-1430 ?OT Individual Time Calculation (min): 30 min  ? ? ?Short Term Goals: ?Week 4:  OT Short Term Goal 1 (Week 4): STG=LTG 2/2 ELOS ? ?Skilled Therapeutic Interventions/Progress Updates:  ?  Pt resting in w/c upon arrival with husband present. OT intervention with focus on education and BUE HEP/stretching activities. Pt's husband return demonstrated BUE/shoulder PROM and AROM with table and incline to facilitate active and passive shoulder ROM. Pt returned to room and remained in w/c with husband present.  ? ?Therapy Documentation ?Precautions:  ?Precautions ?Precautions: Fall ?Restrictions ?Weight Bearing Restrictions: No ?Pain: ?Pt denies pain at rest; increased discomfort reported with BUE activities; rest ? ? ?Therapy/Group: Individual Therapy ? ?Rich Brave ?05/04/2022, 2:49 PM ?

## 2022-05-04 NOTE — Progress Notes (Signed)
?                                                       PROGRESS NOTE ? ? ?Subjective/Complaints: ?Reports she is excited to work on car transfers today. Reports some mild R ankle pain after therapy. Overall pain is well controlled.  ? ? ?ROS: ? ?Pt denies SOB, abd pain, CP, N/V/C/D. No fever or chills.  ? ? ?Objective: ?  ?No results found. ?Recent Labs  ?  05/03/22 ?1221  ?WBC 10.5  ?HGB 12.2  ?HCT 39.0  ?PLT 361  ? ? ? ? ?Recent Labs  ?  05/03/22 ?1221 05/04/22 ?0532  ?NA 139 139  ?K 3.9 4.2  ?CL 100 101  ?CO2 28 30  ?GLUCOSE 127* 111*  ?BUN 45* 48*  ?CREATININE 1.62* 1.45*  ?CALCIUM 9.5 9.3  ? ? ? ? ?Intake/Output Summary (Last 24 hours) at 05/04/2022 0743 ?Last data filed at 05/04/2022 [id number] ?Gross per 24 hour  ?Intake 660 ml  ?Output 750 ml  ?Net -90 ml  ? ?  ? ?  ? ?Physical Exam: ?Gen: no distress, In bed ?HEENT: oral mucosa pink and moist, NCAT ?Cardio: Reg rate ?Chest: normal effort, normal rate of breathing ?Abd: soft, non-distended, Not tender ?Ext: no edema ?Psych: pleasant, normal affect ?Skin: intact ?Musculoskeletal: I  Patient has diffuse weakness ?Skin:Wounds are covered with Tegaderm  +1 lower extremity edema bilaterally.  Her skin is warm and dry and intact ?Mild RLE ankle tenderness ? ? ?Vital Signs ?Blood pressure (!) 113/36, pulse 81, temperature 98 ?F (36.7 ?C), resp. rate 20, height 5\' 3"  (1.6 m), weight (!) 139.8 kg, SpO2 100 %. ? ? ? ?Assessment/Plan: ?1. Functional deficits which require 3+ hours per day of interdisciplinary therapy in a comprehensive inpatient rehab setting. ?Physiatrist is providing close team supervision and 24 hour management of active medical problems listed below. ?Physiatrist and rehab team continue to assess barriers to discharge/monitor patient progress toward functional and medical goals ? ?Care Tool: ? ?Bathing ?   ?Body parts bathed by patient: Right arm, Left arm, Chest, Abdomen, Right upper leg, Left upper leg, Face  ? Body parts bathed by helper: Right arm,  Left arm, Buttocks, Front perineal area, Abdomen ?  ?  ?Bathing assist Assist Level: Moderate Assistance - Patient 50 - 74% ?  ?  ?Upper Body Dressing/Undressing ?Upper body dressing   ?What is the patient wearing?: Pull over shirt ?   ?Upper body assist Assist Level: Minimal Assistance - Patient > 75% ?   ?Lower Body Dressing/Undressing ?Lower body dressing ? ? ?   ?What is the patient wearing?: Incontinence brief ? ?  ? ?Lower body assist Assist for lower body dressing: Maximal Assistance - Patient 25 - 49% ?   ? ?Toileting ?Toileting Toileting Activity did not occur Landscape architect and hygiene only):  (incontient)  ?Toileting assist Assist for toileting: 2 Helpers ?  ?  ?Transfers ?Chair/bed transfer ? ?Transfers assist ? Chair/bed transfer activity did not occur: Refused ? ?Chair/bed transfer assist level: Supervision/Verbal cueing ?  ?  ?Locomotion ?Ambulation ? ? ?Ambulation assist ? ? Ambulation activity did not occur: Safety/medical concerns ? ?Assist level: Supervision/Verbal cueing ?Assistive device: Walker-rolling ?Max distance: 45'  ? ?Walk 10 feet activity ? ? ?Assist ? Walk 10 feet activity did not occur:  Safety/medical concerns ? ?Assist level: Supervision/Verbal cueing ?Assistive device: Walker-rolling  ? ?Walk 50 feet activity ? ? ?Assist Walk 50 feet with 2 turns activity did not occur: Safety/medical concerns ? ?  ?   ? ? ?Walk 150 feet activity ? ? ?Assist Walk 150 feet activity did not occur: Safety/medical concerns ? ?  ?  ?  ? ?Walk 10 feet on uneven surface  ?activity ? ? ?Assist Walk 10 feet on uneven surfaces activity did not occur: Safety/medical concerns ? ? ?  ?   ? ?Wheelchair ? ? ? ? ?Assist Is the patient using a wheelchair?: Yes ?Type of Wheelchair: Manual ?Wheelchair activity did not occur: Refused ? ?Wheelchair assist level: Minimal Assistance - Patient > 75% ?Max wheelchair distance: 10'  ? ? ?Wheelchair 50 feet with 2 turns activity ? ? ? ?Assist ? ?  ?Wheelchair 50 feet  with 2 turns activity did not occur: Refused ? ? ?   ? ?Wheelchair 150 feet activity  ? ? ? ?Assist ? Wheelchair 150 feet activity did not occur: Refused ? ? ?   ? ?Blood pressure (!) 113/36, pulse 81, temperature 98 ?F (36.7 ?C), resp. rate 20, height 5\' 3"  (1.6 m), weight (!) 139.8 kg, SpO2 100 %. ? ?Medical Problem List and Plan: ?1. Functional deficits secondary to debility-for complicated medical stay-CHF exacerbation and bacteremia ?           - Patient may shower ?            -ELOS/Goals: 14 to 18 days MinA PT and OT ? D/c date 5/18 ? Continue CIR- PT, OT and SLP ? Pt will need a bariatric bed with gel overlay mattress due to venous stasis ulcers and BMI- of 56- even though she's ~ 319 lbs, not 350, her BMI due to her height, make her too large to fit in a regular hospital- we've tried and she doesn't fit.  ? Engineer, maintenance (IT) transfer training today ?2.  Antithrombotics: ?-DVT/anticoagulation: Mechanical: Sequential compression devices,   below-knee bilateral lower extremities ?           -04/09/2022 Dopplers negative for DVT's ?4/22-patient refusing to use SCDs however educated on need to use them at risk for DVT's ? 5/9- says is using some ?            -antiplatelet therapy: N/A due to GIB ?3.  Left hip and shoulder pain with myalgias, pain management: Oxycodone as needed for diffuse pain. ?04/08/2022-we will schedule tramadol 100 mg every 8 hours not every 6 hours due to renal issues-creatinine 1.31-continue oxycodone for pain as needed-was taking NSAIDs at home which caused a GI bleed. ?04/12/2022-continue tramadol scheduled and oxycodone as needed-04/13/2022-will not go home on oxycodone just tramadol ?-04/17/2022 patient is well controlled under current pain regimen ?-4/30 No issues of pain overnight. ? 5/1- pt pain controlled- will be able to go home on Tramadol- not Oxy- she's having more pain due to exercise it appears,  ?  5/2- adding Duloxetine 30 mg nightly for nerve pain- explained will take a few days to kick in  - will increase next week, or can be increased as of 04/23/22 at the earliest.  ?5/3- no side effects- but slept better- change to 60 mg on 5/5 ? 5/5 Duloxetine dose increased to 60mg  daily ?5/6 patient reports pain is well managed today continue current regimen. ?5/7 Dr. Naaman Plummer decreased Duloxetine dose to 30 mg since patient had episode of dizziness yesterday, tolerating new dose well so  far. ? 5/9- will try again to increase Duloxetine- if gets dizzy again, will reduce dose-  ? 5/10- sciatic nerve pain- will give Prednisone 20 mg qday x 3 days.  ? 5/11- pain doing much better after prednisone 20 mg- will do for 2 more days.  ? 5/12- verified with pt can go home with 7 days of tramadol, but no oxycodone at d/c.  ? 5/16 R ankle pain-mild today, overall well controlled, continue current meds ?4. Mood: LCSW to follow for evaluation and support. ?- Antipsychotic agents: Not applicable ?5. Neuropsych: This patient is capable of making decisions on her own behalf. ?6. Skin/Wound Care: Pressure relief measures. ?- Monitor for recurrent peripheral edema with increase in activity. ?Modify RF as indicated. ?   -04/08/2022 on low air loss mattress: Continue that along with 28M Tegaderm for ankles bilaterally per WOC ?   -04/12/2022 understands that she will have to remain on direct mattress ?    -Request that pressure is High ?   -04/15/22 switch to Kreg bed. ?   -04/16/2022-much improved pain with ROHO in chair and Kreg bed for coccyx pain ?   -04/17/2022 has adjusted to Kreg bed she continues to have 28M Tegaderm for ankles per WOC ?-5/6 wound is care and skin appear to be doing well.  Continue the 28M Tegaderm on ankles. ?5/7 Ankle/leg swelling continues to improve. ? 5/11- Kreg bed to be replaced tomorrow due to being broken- will send home with Gel overlay bed- ? 5/12- will need bariatric gel overlay- since her BMI is so high.  ?7. Fluids/Electrolytes/Nutrition: Routine in and outs with follow-up chemistries ?  -4/29 Continue to  monitor chemistries. ?5/1- CO2 up to 35- will need to likely wean O2 as tolerated ?            5/3- will check labs in AM to see where CO2 is.  ?            5/4 Co2 31, continue current treatment ?5/6 la

## 2022-05-04 NOTE — Progress Notes (Signed)
RT assisted patient with the placement of her home CPAP.  Patient is tolerating at this time. ?

## 2022-05-04 NOTE — Progress Notes (Signed)
Occupational Therapy Session Note ? ?Patient Details  ?Name: Stacy Hanson ?MRN: [id number] ?Date of Birth: March 27, 1951 ? ?Today's Date: 05/04/2022 ?OT Individual Time: 1000-1055 ?OT Individual Time Calculation (min): 55 min  ? ? ?Short Term Goals: ?Week 4:  OT Short Term Goal 1 (Week 4): STG=LTG 2/2 ELOS ? ?Skilled Therapeutic Interventions/Progress Updates:  ?  Pt resting in w/c upon arrival. OT intervention with focus on BUE PROM/AAROM/AROM, sit<>stand, functional tranfsers, and continued discharge planning. Pt with increased BUE AROM on inclined surface. Sit<>stand from w/c with CGA. Stand pivot transfer to [facility] using RW with CGA. Tot A for toileting tasks. Pt remained on BSC with NT present. ? ?Therapy Documentation ?Precautions:  ?Precautions ?Precautions: Fall ?Restrictions ?Weight Bearing Restrictions: No ? ?Pain: ?Pain Assessment ?Pain Scale: 0-10 ?Pain Score: 7  ?Pain Type: Acute pain ?Pain Location: Foot ?Pain Orientation: Left;Right ? ? ? ?Therapy/Group: Individual Therapy ? ?Rich Brave ?05/04/2022, 11:05 AM ?

## 2022-05-05 ENCOUNTER — Other Ambulatory Visit (HOSPITAL_COMMUNITY): Payer: Self-pay

## 2022-05-05 DIAGNOSIS — I509 Heart failure, unspecified: Secondary | ICD-10-CM | POA: Diagnosis not present

## 2022-05-05 DIAGNOSIS — R5381 Other malaise: Secondary | ICD-10-CM | POA: Diagnosis not present

## 2022-05-05 DIAGNOSIS — N179 Acute kidney failure, unspecified: Secondary | ICD-10-CM | POA: Diagnosis not present

## 2022-05-05 DIAGNOSIS — D62 Acute posthemorrhagic anemia: Secondary | ICD-10-CM | POA: Diagnosis not present

## 2022-05-05 LAB — BASIC METABOLIC PANEL
Anion gap: 11 (ref 5–15)
BUN: 40 mg/dL — ABNORMAL HIGH (ref 8–23)
CO2: 30 mmol/L (ref 22–32)
Calcium: 9.6 mg/dL (ref 8.9–10.3)
Chloride: 99 mmol/L (ref 98–111)
Creatinine, Ser: 1.39 mg/dL — ABNORMAL HIGH (ref 0.44–1.00)
GFR, Estimated: 40 mL/min — ABNORMAL LOW (ref >60)
Glucose, Bld: 143 mg/dL — ABNORMAL HIGH (ref 70–99)
Potassium: 4.1 mmol/L (ref 3.5–5.1)
Sodium: 140 mmol/L (ref 135–145)

## 2022-05-05 MED ORDER — CERTAVITE/ANTIOXIDANTS PO TABS
1.0000 | ORAL_TABLET | Freq: Every day | ORAL | 0 refills | Status: AC
  Filled 2022-05-05: qty 30, 30d supply, fill #0

## 2022-05-05 MED ORDER — MAGNESIUM OXIDE 400 MG PO TABS
400.0000 mg | ORAL_TABLET | Freq: Every day | ORAL | 0 refills | Status: AC
Start: 2022-05-05 — End: ?
  Filled 2022-05-05: qty 30, 30d supply, fill #0

## 2022-05-05 MED ORDER — IRON POLYSACCH CMPLX-B12-FA 150-0.025-1 MG PO CAPS
150.0000 mg | ORAL_CAPSULE | Freq: Every day | ORAL | 0 refills | Status: AC
  Filled 2022-05-05: qty 30, 30d supply, fill #0

## 2022-05-05 MED ORDER — DULOXETINE HCL 60 MG PO CPEP
60.0000 mg | ORAL_CAPSULE | Freq: Every day | ORAL | 0 refills | Status: AC
  Filled 2022-05-05: qty 30, 30d supply, fill #0

## 2022-05-05 MED ORDER — FUROSEMIDE 40 MG PO TABS
40.0000 mg | ORAL_TABLET | Freq: Every day | ORAL | Status: DC
Start: 2022-05-07 — End: 2022-05-06

## 2022-05-05 MED ORDER — FENOFIBRATE 160 MG PO TABS
160.0000 mg | ORAL_TABLET | Freq: Every day | ORAL | 0 refills | Status: AC
  Filled 2022-05-05: qty 30, 30d supply, fill #0

## 2022-05-05 MED ORDER — POLYETHYLENE GLYCOL 3350 17 GM/SCOOP PO POWD
17.0000 g | Freq: Every day | ORAL | 0 refills | Status: AC
  Filled 2022-05-05: qty 238, 14d supply, fill #0

## 2022-05-05 MED ORDER — TRAZODONE HCL 50 MG PO TABS
25.0000 mg | ORAL_TABLET | Freq: Every evening | ORAL | 0 refills | Status: AC | PRN
  Filled 2022-05-05: qty 15, 15d supply, fill #0

## 2022-05-05 MED ORDER — SPIRONOLACTONE 25 MG PO TABS
25.0000 mg | ORAL_TABLET | Freq: Every day | ORAL | 0 refills | Status: AC
Start: 2022-05-05 — End: ?
  Filled 2022-05-05: qty 30, 30d supply, fill #0

## 2022-05-05 MED ORDER — METHOCARBAMOL 500 MG PO TABS
500.0000 mg | ORAL_TABLET | Freq: Three times a day (TID) | ORAL | 0 refills | Status: AC
Start: 2022-05-05 — End: ?
  Filled 2022-05-05: qty 90, 30d supply, fill #0

## 2022-05-05 MED ORDER — PANTOPRAZOLE SODIUM 40 MG PO TBEC
40.0000 mg | DELAYED_RELEASE_TABLET | Freq: Two times a day (BID) | ORAL | 0 refills | Status: AC
Start: 2022-05-05 — End: ?
  Filled 2022-05-05: qty 60, 30d supply, fill #0

## 2022-05-05 MED ORDER — FUROSEMIDE 40 MG PO TABS
40.0000 mg | ORAL_TABLET | Freq: Every day | ORAL | 0 refills | Status: AC
Start: 2022-05-05 — End: ?
  Filled 2022-05-05: qty 30, 30d supply, fill #0

## 2022-05-05 MED ORDER — TRAMADOL HCL 50 MG PO TABS
50.0000 mg | ORAL_TABLET | Freq: Three times a day (TID) | ORAL | 0 refills | Status: AC
  Filled 2022-05-05: qty 35, 6d supply, fill #0

## 2022-05-05 MED ORDER — ROPINIROLE HCL 1 MG PO TABS
2.0000 mg | ORAL_TABLET | ORAL | 0 refills | Status: AC
  Filled 2022-05-05: qty 210, 30d supply, fill #0

## 2022-05-05 MED ORDER — OXYCODONE HCL 5 MG PO TABS
5.0000 mg | ORAL_TABLET | Freq: Four times a day (QID) | ORAL | 0 refills | Status: AC | PRN
  Filled 2022-05-05: qty 21, 6d supply, fill #0

## 2022-05-05 NOTE — Progress Notes (Signed)
Occupational Therapy Discharge Summary ? ?Patient Details  ?Name: Stacy Hanson ?MRN: [id number] ?Date of Birth: [birth year] ? ?Patient has met 9 of 9 long term goals due to improved activity tolerance, improved balance, postural control, ability to compensate for deficits, functional use of  RIGHT upper and LEFT upper extremity, and improved coordination.  Pt made steady progress with BADLs and functional transfers during this admission. Pt with limited BUE AROM but able to complete bathing tasks with mod A using AE PRN. Pt requires mod A for LB dressing tasks and supervision for UB dressing tasks. Sit<>stand and standing balance with supervision. BSC transfers with supervision. Pt's husband has been present and participated in therapy sessions. Patient to discharge at overall Mod Assist level.  Patient's care partner is independent to provide the necessary physical assistance at discharge.   ? ?Reasons goals not met: n/a ? ?Recommendation:  ?Patient will benefit from ongoing skilled OT services in home health setting to continue to advance functional skills in the area of BADL, iADL, and Reduce care partner burden. ? ?Equipment: ?Bariatric BSC ? ?Reasons for discharge: treatment goals met and discharge from hospital ? ?Patient/family agrees with progress made and goals achieved: Yes ? ?OT Discharge ?ADL ?ADL ?Equipment Provided: Reacher, Long-handled sponge ?Eating: Independent ?Where Assessed-Eating: Wheelchair ?Grooming: Supervision/safety ?Where Assessed-Grooming: Sitting at sink ?Upper Body Bathing: Minimal assistance ?Where Assessed-Upper Body Bathing: Wheelchair ?Lower Body Bathing: Maximal assistance ?Where Assessed-Lower Body Bathing: Bed level ?Upper Body Dressing: Supervision/safety ?Where Assessed-Upper Body Dressing: Wheelchair ?Lower Body Dressing: Maximal assistance ?Where Assessed-Lower Body Dressing: Standing at sink ?Toileting: Maximal assistance ?Where Assessed-Toileting: Bedside Commode ?Toilet  Transfer: Close supervision ?Toilet Transfer Method: Stand pivot ?Science writer: Extra wide bedside commode ?Walk-In Shower Transfer: Not assessed ?Vision ?Baseline Vision/History: 1 Wears glasses ?Patient Visual Report: No change from baseline ?Vision Assessment?: No apparent visual deficits ?Perception  ?Perception: Within Functional Limits ?Praxis ?Praxis: Intact ?Cognition ?Cognition ?Overall Cognitive Status: Within Functional Limits for tasks assessed ?Arousal/Alertness: Awake/alert ?Orientation Level: Person;Place;Situation ?Person: Oriented ?Place: Oriented ?Situation: Oriented ?Memory: Appears intact ?Attention: Selective ?Focused Attention: Appears intact ?Sustained Attention: Appears intact ?Selective Attention: Appears intact ?Awareness: Appears intact ?Problem Solving: Appears intact ?Safety/Judgment: Appears intact ?Brief Interview for Mental Status (BIMS) ?Repetition of Three Words (First Attempt): 3 ?Temporal Orientation: Year: Correct ?Temporal Orientation: Month: Accurate within 5 days ?Temporal Orientation: Day: Correct ?Recall: "Sock": Yes, no cue required ?Recall: "Blue": Yes, no cue required ?Recall: "Bed": Yes, no cue required ?BIMS Summary Score: 15 ?Sensation ?Sensation ?Light Touch: Impaired Detail ?Peripheral sensation comments: feet hurt and reports abnormal sensation. but able to detect all stimuli ?Light Touch Impaired Details: Impaired RLE;Impaired LLE ?Hot/Cold: Appears Intact ?Proprioception: Appears Intact ?Stereognosis: Not tested ?Coordination ?Gross Motor Movements are Fluid and Coordinated: No ?Fine Motor Movements are Fluid and Coordinated: No ?Finger Nose Finger Test: unable to assess 2/2 limited BUE ROM ?Motor  ?Motor ?Motor: Within Functional Limits ?Motor - Skilled Clinical Observations: significant generalized weakness ?Trunk/Postural Assessment  ?Cervical Assessment ?Cervical Assessment: Exceptions to [facility] (forward head) ?Thoracic Assessment ?Thoracic  Assessment: Exceptions to [facility] (rounded shoulders) ?Lumbar Assessment ?Lumbar Assessment: Exceptions to [facility] (posterior pelvic tilt) ?Postural Control ?Postural Control: Within Functional Limits  ?Balance ?Static Sitting Balance ?Static Sitting - Balance Support: Feet supported ?Static Sitting - Level of Assistance: 6: Modified independent (Device/Increase time) ?Dynamic Sitting Balance ?Dynamic Sitting - Balance Support: During functional activity ?Dynamic Sitting - Level of Assistance: 5: Stand by assistance ?Extremity/Trunk Assessment ?RUE Assessment ?Active Range of Motion (AROM) Comments: shoulder flexion ~  110* ?General Strength Comments: 3/5 generalized weakness esp at shoulder ?LUE Assessment ?Passive Range of Motion (PROM) Comments: Limited ROM bilateral shoulder with stiffness and crepitus left elbow with attempts at ROM. No pain with PROM left shoulder. ?Active Range of Motion (AROM) Comments: ~45 degrees of shoulder flexion ?General Strength Comments: 2/5 ? ? ?Leroy Libman ?05/05/2022, 6:38 AM ?

## 2022-05-05 NOTE — Progress Notes (Signed)
?                                                       PROGRESS NOTE ? ? ?Subjective/Complaints: ?Pt is looking forward to her discharge tomorrow. No additional concerns.  ? ? ?ROS: ? ?Pt denies SOB, abd pain, CP, N/V/C/D. No fever or chills.  No HA ? ? ?Objective: ?  ?No results found. ?Recent Labs  ?  05/03/22 ?1221  ?WBC 10.5  ?HGB 12.2  ?HCT 39.0  ?PLT 361  ? ? ? ? ?Recent Labs  ?  05/04/22 ?0532 05/05/22 ?[id number]  ?NA 139 140  ?K 4.2 4.1  ?CL 101 99  ?CO2 30 30  ?GLUCOSE 111* 143*  ?BUN 48* 40*  ?CREATININE 1.45* 1.39*  ?CALCIUM 9.3 9.6  ? ? ? ?Intake/Output Summary (Last 24 hours) at 05/05/2022 0744 ?Last data filed at 05/05/2022 0118 ?Gross per 24 hour  ?Intake 360 ml  ?Output 1100 ml  ?Net -740 ml  ? ?  ? ?  ? ?Physical Exam: ?Gen: no distress, In bed ?HEENT: oral mucosa pink and moist, NCAT ?Cardio: Reg rate ?Chest: normal effort, normal rate of breathing ?Abd: soft, non-distended, Not tender, + BS ?Ext: 1+ edema ?Psych: pleasant, normal affect ?Skin: intact ?Musculoskeletal: I  Patient has diffuse weakness, moving all 4 extremities ?Skin:Wounds are covered with Tegaderm   Her skin is warm and dry and intact ? ? ?Vital Signs ?Blood pressure (!) 131/58, pulse 78, temperature 97.7 ?F (36.5 ?C), temperature source Oral, resp. rate 14, height 5\' 3"  (1.6 m), weight (!) 139.8 kg, SpO2 96 %. ? ? ? ?Assessment/Plan: ?1. Functional deficits which require 3+ hours per day of interdisciplinary therapy in a comprehensive inpatient rehab setting. ?Physiatrist is providing close team supervision and 24 hour management of active medical problems listed below. ?Physiatrist and rehab team continue to assess barriers to discharge/monitor patient progress toward functional and medical goals ? ?Care Tool: ? ?Bathing ?   ?Body parts bathed by patient: Right arm, Left arm, Chest, Abdomen, Right upper leg, Left upper leg, Face  ? Body parts bathed by helper: Right arm, Left arm, Buttocks, Front perineal area, Abdomen ?  ?  ?Bathing  assist Assist Level: Moderate Assistance - Patient 50 - 74% ?  ?  ?Upper Body Dressing/Undressing ?Upper body dressing   ?What is the patient wearing?: Pull over shirt ?   ?Upper body assist Assist Level: Minimal Assistance - Patient > 75% ?   ?Lower Body Dressing/Undressing ?Lower body dressing ? ? ?   ?What is the patient wearing?: Incontinence brief ? ?  ? ?Lower body assist Assist for lower body dressing: Maximal Assistance - Patient 25 - 49% ?   ? ?Toileting ?Toileting Toileting Activity did not occur Landscape architect and hygiene only):  (incontient)  ?Toileting assist Assist for toileting: 2 Helpers ?  ?  ?Transfers ?Chair/bed transfer ? ?Transfers assist ? Chair/bed transfer activity did not occur: Refused ? ?Chair/bed transfer assist level: Supervision/Verbal cueing ?  ?  ?Locomotion ?Ambulation ? ? ?Ambulation assist ? ? Ambulation activity did not occur: Safety/medical concerns ? ?Assist level: Supervision/Verbal cueing ?Assistive device: Walker-rolling ?Max distance: 51'  ? ?Walk 10 feet activity ? ? ?Assist ? Walk 10 feet activity did not occur: Safety/medical concerns ? ?Assist level: Supervision/Verbal cueing ?Assistive device: Walker-rolling  ? ?  Walk 50 feet activity ? ? ?Assist Walk 50 feet with 2 turns activity did not occur: Safety/medical concerns ? ?  ?   ? ? ?Walk 150 feet activity ? ? ?Assist Walk 150 feet activity did not occur: Safety/medical concerns ? ?  ?  ?  ? ?Walk 10 feet on uneven surface  ?activity ? ? ?Assist Walk 10 feet on uneven surfaces activity did not occur: Safety/medical concerns ? ? ?  ?   ? ?Wheelchair ? ? ? ? ?Assist Is the patient using a wheelchair?: Yes ?Type of Wheelchair: Manual ?Wheelchair activity did not occur: Refused ? ?Wheelchair assist level: Minimal Assistance - Patient > 75% ?Max wheelchair distance: 10'  ? ? ?Wheelchair 50 feet with 2 turns activity ? ? ? ?Assist ? ?  ?Wheelchair 50 feet with 2 turns activity did not occur: Refused ? ? ?   ? ?Wheelchair  150 feet activity  ? ? ? ?Assist ? Wheelchair 150 feet activity did not occur: Refused ? ? ?   ? ?Blood pressure (!) 131/58, pulse 78, temperature 97.7 ?F (36.5 ?C), temperature source Oral, resp. rate 14, height 5\' 3"  (1.6 m), weight (!) 139.8 kg, SpO2 96 %. ? ?Medical Problem List and Plan: ?1. Functional deficits secondary to debility-for complicated medical stay-CHF exacerbation and bacteremia ?           - Patient may shower ?            -ELOS/Goals: 14 to 18 days MinA PT and OT ? D/c date 5/18, tomorrow ? Continue CIR- PT, OT and SLP ? Pt will need a bariatric bed with gel overlay mattress due to venous stasis ulcers and BMI- of 56- even though she's ~ 319 lbs, not 350, her BMI due to her height, make her too large to fit in a regular hospital- we've tried and she doesn't fit.  ? -Car transfer training completed yesterday, pt very happy with results ?2.  Antithrombotics: ?-DVT/anticoagulation: Mechanical: Sequential compression devices,   below-knee bilateral lower extremities ?           -04/09/2022 Dopplers negative for DVT's ?4/22-patient refusing to use SCDs however educated on need to use them at risk for DVT's ? 5/9- says is using some ?            -antiplatelet therapy: N/A due to GIB ?3.  Left hip and shoulder pain with myalgias, pain management: Oxycodone as needed for diffuse pain. ?04/08/2022-we will schedule tramadol 100 mg every 8 hours not every 6 hours due to renal issues-creatinine 1.31-continue oxycodone for pain as needed-was taking NSAIDs at home which caused a GI bleed. ?04/12/2022-continue tramadol scheduled and oxycodone as needed-04/13/2022-will not go home on oxycodone just tramadol ?-04/17/2022 patient is well controlled under current pain regimen ?-4/30 No issues of pain overnight. ? 5/1- pt pain controlled- will be able to go home on Tramadol- not Oxy- she's having more pain due to exercise it appears,  ?  5/2- adding Duloxetine 30 mg nightly for nerve pain- explained will take a few days  to kick in - will increase next week, or can be increased as of 04/23/22 at the earliest.  ?5/3- no side effects- but slept better- change to 60 mg on 5/5 ? 5/5 Duloxetine dose increased to 60mg  daily ?5/6 patient reports pain is well managed today continue current regimen. ?5/7 Dr. Naaman Plummer decreased Duloxetine dose to 30 mg since patient had episode of dizziness yesterday, tolerating new dose well so far. ?  5/9- will try again to increase Duloxetine- if gets dizzy again, will reduce dose-  ? 5/10- sciatic nerve pain- will give Prednisone 20 mg qday x 3 days.  ? 5/11- pain doing much better after prednisone 20 mg- will do for 2 more days.  ? 5/12- verified with pt can go home with 7 days of tramadol, but no oxycodone at d/c.  ? 5/16 R ankle pain-mild today, overall well controlled, continue current meds ?4. Mood: LCSW to follow for evaluation and support. ?- Antipsychotic agents: Not applicable ?5. Neuropsych: This patient is capable of making decisions on her own behalf. ?6. Skin/Wound Care: Pressure relief measures. ?- Monitor for recurrent peripheral edema with increase in activity. ?Modify RF as indicated. ?   -04/08/2022 on low air loss mattress: Continue that along with 39M Tegaderm for ankles bilaterally per WOC ?   -04/12/2022 understands that she will have to remain on direct mattress ?    -Request that pressure is High ?   -04/15/22 switch to Kreg bed. ?   -04/16/2022-much improved pain with ROHO in chair and Kreg bed for coccyx pain ?   -04/17/2022 has adjusted to Kreg bed she continues to have 39M Tegaderm for ankles per WOC ?-5/6 wound is care and skin appear to be doing well.  Continue the 39M Tegaderm on ankles. ?5/7 Ankle/leg swelling continues to improve. ? 5/11- Kreg bed to be replaced tomorrow due to being broken- will send home with Gel overlay bed- ? 5/12- will need bariatric gel overlay- since her BMI is so high.  ?7. Fluids/Electrolytes/Nutrition: Routine in and outs with follow-up chemistries ?  -4/29  Continue to monitor chemistries. ?5/1- CO2 up to 35- will need to likely wean O2 as tolerated ?            5/3- will check labs in AM to see where CO2 is.  ?            5/4 Co2 31, continue current tr

## 2022-05-05 NOTE — Progress Notes (Signed)
Pt's home cpap was sent home today in preparation for discharge tomorrow. Pt declined wearing ours tonight. No resp distress noted.  ?

## 2022-05-05 NOTE — Progress Notes (Signed)
Occupational Therapy Session Note ? ?Patient Details  ?Name: Stacy Hanson ?MRN: [id number] ?Date of Birth: 25-Dec-1950 ? ?Today's Date: 05/05/2022 ?OT Individual Time: 9892-1194 ?OT Individual Time Calculation (min): 70 min  ? ? ?Short Term Goals: ?Week 4:  OT Short Term Goal 1 (Week 4): STG=LTG 2/2 ELOS ? ?Skilled Therapeutic Interventions/Progress Updates:  ?  Pt resting in bed upon arrival and ready to get OOB. Supine>sit EOB with supervision with HOB elevated and pt using bed rails. Sitting balance EOB with supervision. Sit<>stand from EOB and stand pivot transfers with RW at supervision level. Pt donned tshirt over her night gown before transitioning to gym. OT intervention with focus on BUE PROM/AAROM/AROM and stretching. Pt with trigger points in bil upper traps-myofascial release with reported relief. Table slides on level table and 15* incline-3x10 each UE with rest breaks. Pt returned to room and remained in w/c with all needs within reach.  ? ?Therapy Documentation ?Precautions:  ?Precautions ?Precautions: Fall ?Restrictions ?Weight Bearing Restrictions: No ? ?Pain: ?Pt reports BUE/shoulders sore from previous days therapies; stretching/PROM  ? ?Therapy/Group: Individual Therapy ? ?Rich Brave ?05/05/2022, 10:30 AM ?

## 2022-05-05 NOTE — Progress Notes (Signed)
Patient ID: Stacy Hanson, female   DOB: [birth year], 71 y.o.   MRN: [id number] ? ?SW met with pt and pt husband to confirm hospital delivery. Reports they did not want bariatric hospital bed because it was too heavy, wide, and unable to adjust height. States they opted for standard semi electric hospital bed with gel overlay. Other issues with [employer] have been relayed to vendor. No other questions/concerns reported.  ? ?[name], MSW, LCSWA ?Office: [phone] ?Cell: [phone] ?Fax: [phone]  ?

## 2022-05-05 NOTE — Progress Notes (Signed)
Physical Therapy Session Note ? ?Patient Details  ?Name: Stacy Hanson ?MRN: [id number] ?Date of Birth: 1951-04-26 ? ?Today's Date: 05/05/2022 ?PT Individual Time: 1100-1200 and 1420-1530 ?PT Individual Time Calculation (min): 60 min and 70 min ? ?Short Term Goals: ?Week 4:  PT Short Term Goal 1 (Week 4): =LTG due to ELOS ? ?Skilled Therapeutic Interventions/Progress Updates: Tx1: Pt presented in w/c agreeable to therapy. Pt states some discomfort in B feet but not rated and no pain intervention required. Session focused on ambulation to build endurance prior to d/c. Pt requesting to use bathroom prior to leaving room. Performed stand pivot with minA for standing and CGA for transfer to [facility]. Pt required total A for brief management and peri-care. Pt transported to day room and participated in ambulation 68f and 43fwith RW and CGA. Pt fluctuated between minA and CGA for Sit to stand and was CGA for stand to sit and CGA to supervision for ambulation (PTA was CGA to manage brief as pt did not have pants on). Pt did require increased time between bouts due to fatigue. Pt then transported to ortho gym and participated in visual tracking in sequence for standing tolerance. Pt was able to complete a full bout of the alphabet with RUE with intermittent HOH assist for increased reaching. Pt with fair tolerance to activity. Pt transported back to room and performed stand pivot transfer to bed with RW and CGA overall. Pt required modA for BLE management sit to supine and was able to perform lateral scoots with increased effort to reposition in bed. With bed in trendelenburg and PTA stabilizing B feet, pt was able to scoot to [facility] left in bed at end of session with call bell within reach and needs met.  ? ?Tx2: Pt presented in bed agreeable to therapy. Stacy Beltsusband present throughout duration of session. Pt indicates some fatigue from earlier therapies but no significant pain. Pt requesting to use urinal prior to therapy  with PTA encouraging use of BSC vs urinal as pt to be d/c at ambulatory level tomorrow. Pt performed supine to sit at EOB with supervision and use of bed features. Pt then performed stand pivot [facility] for stand and supervision for transfer (+urinary void/BM). Pt required total A for peri-care once completed and was able to transfer to w/c with supervision. Pt transported to day room and performed Sit to stand with CGA and performed short distance ambulation while picking up bean bags with reacher. Pt then participated in several bouts of horseshoes while in standing (taking seated rest breaks between bouts) incorporating reaching with RUE. Pt transported back to room and performed ambulatory transfer to L side of bed to practice bed mobility from L side as would be per home set up. Pt required modA for sit to supine with increased effort due to LUE weakness. Pt was able to boost to [facility] in trendelenburg and PTA stabilizing BLE. Pt repositioned to comfort and left with call bell within reach and current needs met.   ?   ? ?Therapy Documentation ?Precautions:  ?Precautions ?Precautions: Fall ?Restrictions ?Weight Bearing Restrictions: No ?General: ?  ?Vital Signs: ? ?Pain: ?Pain Assessment ?Pain Score: 3  ?Mobility: ?  ?Locomotion : ?   ?Trunk/Postural Assessment : ?   ?Balance: ?  ?Exercises: ?  ?Other Treatments:   ? ? ? ?Therapy/Group: Individual Therapy ? ?Stacy Hanson ?05/05/2022, 12:08 PM  ?

## 2022-05-05 NOTE — Progress Notes (Signed)
Inpatient Rehabilitation Discharge Medication Review by a Pharmacist  A complete drug regimen review was completed for this patient to identify any potential clinically significant medication issues.  High Risk Drug Classes Is patient taking? Indication by Medication  Antipsychotic No   Anticoagulant No   Antibiotic No   Opioid Yes Tramadol, OxyIR- pain  Antiplatelet No   Hypoglycemics/insulin No   Vasoactive Medication Yes Lasix, aldactone- hypertension  Chemotherapy No   Other Yes Tricor- hypertriglyceridemia Protonix- GIB Requip- RLS Cymbalta- MDD Niferex- anemia     Type of Medication Issue Identified Description of Issue Recommendation(s)  Drug Interaction(s) (clinically significant)     Duplicate Therapy     Allergy     No Medication Administration End Date     Incorrect Dose     Additional Drug Therapy Needed     Significant med changes from prior encounter (inform family/care partners about these prior to discharge).    Other       Clinically significant medication issues were identified that warrant physician communication and completion of prescribed/recommended actions by midnight of the next day:  No  Time spent performing this drug regimen review (minutes):  30   Adonna Horsley BS, PharmD, BCPS Clinical Pharmacist 05/06/2022 8:15 AM  Contact: [phone] after 3 PM  "Be curious, not judgmental..." -Debbora Dus

## 2022-05-05 NOTE — Progress Notes (Signed)
Physical Therapy Discharge Summary ? ?Patient Details  ?Name: Stacy Hanson ?MRN: [id number] ?Date of Birth: 03-15-1951 ? ?Today's Date: 05/05/2022 ? ? ?Patient has met 5 of 7 long term goals due to improved activity tolerance, improved balance, improved postural control, increased strength, increased range of motion, decreased pain, and improved coordination.  Patient to discharge at an ambulatory level Supervision.   Patient's care partner is independent to provide the necessary physical assistance at discharge. ? ?Reasons goals not met: Pt was intermittent CGA to minA for sit to/from stand transfers pending fatigue and pain levels. Pt was able to attain sit to/from stand with supervision intermittently. Due to decreased endurance pt was unable to attain goal of 165f for ambulation however was able to reach max distance of 464fwith RW.  ? ?Recommendation:  ?Patient will benefit from ongoing skilled PT services in home health setting to continue to advance safe functional mobility, address ongoing impairments in endurance, strength, balance, safety, independence with functional mobility, and minimize fall risk. Pt would benefit from ongoing practice navigating platform steps with RW, practicing safe transfers in/out of her tub, and practicing get in/out of bed. ? ?Equipment: ?Bariatric RW ? ?Reasons for discharge: treatment goals met and discharge from hospital ? ?Patient/family agrees with progress made and goals achieved: Yes ? ?PT Discharge ?Precautions/Restrictions ?Precautions ?Precautions: Fall ?Restrictions ?Weight Bearing Restrictions: No ?Pain ?Pain Assessment ?Pain Score: 3  ?Pain Interference ?Pain Interference ?Pain Effect on Sleep: 3. Frequently ?Pain Interference with Therapy Activities: 2. Occasionally ?Pain Interference with Day-to-Day Activities: 3. Frequently ?Vision/Perception  ?Vision - History ?Ability to See in Adequate Light: 0 Adequate ?Perception ?Perception: Within Functional  Limits ?Praxis ?Praxis: Intact  ?Cognition ?Overall Cognitive Status: Within Functional Limits for tasks assessed ?Arousal/Alertness: Awake/alert ?Orientation Level: Oriented X4 ?Sensation ?Sensation ?Light Touch: Impaired Detail ?Motor  ?Motor ?Motor: Within Functional Limits ?Motor - Discharge Observations: improved but demosntrates genralized weakness  ?Mobility ?Bed Mobility ?Bed Mobility: Rolling Right;Rolling Left;Right Sidelying to Sit ?Rolling Right: Supervision/verbal cueing ?Rolling Left: Supervision/Verbal cueing ?Right Sidelying to Sit: Supervision/Verbal cueing ?Transfers ?Transfers: Sit to Stand;Stand to SiLockheed Martinransfers ?Sit to Stand: Contact Guard/Touching assist;Minimal Assistance - Patient > 75% ?Stand to Sit: Contact Guard/Touching assist;Minimal Assistance - Patient > 75% ?Stand Pivot Transfers: Supervision/Verbal cueing ?Transfer (Assistive device): Rolling walker ?Locomotion  ?Gait ?Ambulation: Yes ?Gait Assistance: Supervision/Verbal cueing ?Gait Distance (Feet): 40 Feet ?Assistive device: Rolling walker ?Gait ?Gait: Yes ?Gait Pattern: Impaired ?Gait Pattern: Poor foot clearance - left;Poor foot clearance - right;Wide base of support ?Stairs / Additional Locomotion ?Stairs: No ?Wheelchair Mobility ?Wheelchair Mobility: No  ?Trunk/Postural Assessment  ?Cervical Assessment ?Cervical Assessment: Exceptions to [facility]waard head) ?Thoracic Assessment ?Thoracic Assessment: Exceptions to WFThe [facility] Surgeryrounded shoulders) ?Lumbar Assessment ?Lumbar Assessment: Exceptions to WFMed[facility] Hospitalposterior pelvic tilt) ?Postural Control ?Postural Control: Within Functional Limits  ?Balance ?Balance ?Balance Assessed: Yes ?Static Sitting Balance ?Static Sitting - Balance Support: Feet supported ?Static Sitting - Level of Assistance: 6: Modified independent (Device/Increase time) ?Dynamic Sitting Balance ?Dynamic Sitting - Balance Support: During functional activity ?Dynamic Sitting - Level of Assistance: 5: Stand by  assistance ?Static Standing Balance ?Static Standing - Level of Assistance: 5: Stand by assistance ?Dynamic Standing Balance ?Dynamic Standing - Balance Support: During functional activity ?Dynamic Standing - Level of Assistance: 5: Stand by assistance ?Dynamic Standing - Comments: able to perform BITS in standing with RW ?Extremity Assessment  ?RUE Assessment ?Active Range of Motion (AROM) Comments: shoulder flexion ~110* ?General Strength Comments: 3/5 generalized weakness esp at shoulder ?LUE  Assessment ?Passive Range of Motion (PROM) Comments: Limited ROM bilateral shoulder with stiffness and crepitus left elbow with attempts at ROM. No pain with PROM left shoulder. ?Active Range of Motion (AROM) Comments: ~45 degrees of shoulder flexion ?General Strength Comments: 2/5 ?RLE Assessment ?RLE Assessment: Exceptions to [facility] ?RLE AROM (degrees) ?Overall AROM Right Lower Extremity: Within functional limits for tasks assessed ?RLE Strength ?Right Hip Flexion: 3+/5 ?Right Hip ABduction: 4/5 ?Right Hip ADduction: 4/5 ?Right Knee Flexion: 4-/5 ?Right Knee Extension: 4-/5 ?Right Ankle Dorsiflexion: 4/5 ?LLE Assessment ?LLE Assessment: Exceptions to [facility] ?Active Range of Motion (AROM) Comments: WFL ?LLE Strength ?Left Hip Flexion: 4/5 ?Left Hip ABduction: 4/5 ?Left Hip ADduction: 4/5 ?Left Knee Flexion: 4/5 ?Left Knee Extension: 4/5 ?Left Ankle Dorsiflexion: 4/5 ? ? ? ?[name] ?[name], PT, DPT, CSRS ?05/05/2022, 1:03 PM ?

## 2022-05-06 ENCOUNTER — Other Ambulatory Visit (HOSPITAL_COMMUNITY): Payer: Self-pay

## 2022-05-06 DIAGNOSIS — G4733 Obstructive sleep apnea (adult) (pediatric): Secondary | ICD-10-CM | POA: Diagnosis not present

## 2022-05-06 DIAGNOSIS — I959 Hypotension, unspecified: Secondary | ICD-10-CM

## 2022-05-06 DIAGNOSIS — N179 Acute kidney failure, unspecified: Secondary | ICD-10-CM | POA: Diagnosis not present

## 2022-05-06 DIAGNOSIS — Z9989 Dependence on other enabling machines and devices: Secondary | ICD-10-CM

## 2022-05-06 DIAGNOSIS — R5381 Other malaise: Secondary | ICD-10-CM | POA: Diagnosis not present

## 2022-05-06 LAB — BASIC METABOLIC PANEL
Anion gap: 8 (ref 5–15)
BUN: 34 mg/dL — ABNORMAL HIGH (ref 8–23)
CO2: 28 mmol/L (ref 22–32)
Calcium: 9.5 mg/dL (ref 8.9–10.3)
Chloride: 104 mmol/L (ref 98–111)
Creatinine, Ser: 1.24 mg/dL — ABNORMAL HIGH (ref 0.44–1.00)
GFR, Estimated: 46 mL/min — ABNORMAL LOW (ref >60)
Glucose, Bld: 100 mg/dL — ABNORMAL HIGH (ref 70–99)
Potassium: 4.2 mmol/L (ref 3.5–5.1)
Sodium: 140 mmol/L (ref 135–145)

## 2022-05-06 NOTE — Progress Notes (Signed)
PROGRESS NOTE   Subjective/Complaints: Pt looking forward to going home today. No further questions.     ROS:  Pt denies SOB, abd pain, CP, N/V/C/D. No fever or chills.  No HA or vision changes.    Objective:   No results found. Recent Labs    05/03/22 1221  WBC 10.5  HGB 12.2  HCT 39.0  PLT 361      Recent Labs    05/05/22 0742 05/06/22 0535  NA 140 140  K 4.1 4.2  CL 99 104  CO2 30 28  GLUCOSE 143* 100*  BUN 40* 34*  CREATININE 1.39* 1.24*  CALCIUM 9.6 9.5      Intake/Output Summary (Last 24 hours) at 05/06/2022 0736 Last data filed at 05/06/2022 [id number] Gross per 24 hour  Intake 240 ml  Output 300 ml  Net -60 ml         Physical Exam: Gen: no distress, In bed HEENT: oral mucosa pink and moist, NCAT Cardio: Reg rate Chest: CTAB, normal effort, normal rate of breathing Abd: soft, non-distended, Not tender, + BS Ext: 1+ edema in LE Psych: pleasant, normal affect Skin: intact Musculoskeletal: I  Patient has diffuse weakness, moving all 4 extremities Skin:Wounds are covered with Tegaderm   Her skin is warm and dry and intact   Vital Signs Blood pressure (!) 155/59, pulse 75, temperature 98.4 F (36.9 C), temperature source Oral, resp. rate 14, height 5\' 3"  (1.6 m), weight (!) 143.1 kg, SpO2 97 %.    Assessment/Plan: 1. Functional deficits which require 3+ hours per day of interdisciplinary therapy in a comprehensive inpatient rehab setting. Physiatrist is providing close team supervision and 24 hour management of active medical problems listed below. Physiatrist and rehab team continue to assess barriers to discharge/monitor patient progress toward functional and medical goals  Care Tool:  Bathing    Body parts bathed by patient: Right arm, Left arm, Chest, Abdomen, Right upper leg, Left upper leg, Face   Body parts bathed by helper: Buttocks, Front perineal area, Right lower leg, Left  lower leg     Bathing assist Assist Level: Moderate Assistance - Patient 50 - 74%     Upper Body Dressing/Undressing Upper body dressing   What is the patient wearing?: Pull over shirt    Upper body assist Assist Level: Supervision/Verbal cueing    Lower Body Dressing/Undressing Lower body dressing      What is the patient wearing?: Incontinence brief, Skirt     Lower body assist Assist for lower body dressing: Moderate Assistance - Patient 50 - 74%     Toileting Toileting Toileting Activity did not occur Landscape architect and hygiene only):  (incontient)  Toileting assist Assist for toileting: Maximal Assistance - Patient 25 - 49%     Transfers Chair/bed transfer  Transfers assist  Chair/bed transfer activity did not occur: Refused  Chair/bed transfer assist level: Supervision/Verbal cueing     Locomotion Ambulation   Ambulation assist   Ambulation activity did not occur: Safety/medical concerns  Assist level: Supervision/Verbal cueing Assistive device: Walker-rolling Max distance: 37ft   Walk 10 feet activity   Assist  Walk 10 feet activity did not occur:  Safety/medical concerns  Assist level: Supervision/Verbal cueing Assistive device: Walker-rolling   Walk 50 feet activity   Assist Walk 50 feet with 2 turns activity did not occur: Safety/medical concerns         Walk 150 feet activity   Assist Walk 150 feet activity did not occur: Safety/medical concerns         Walk 10 feet on uneven surface  activity   Assist Walk 10 feet on uneven surfaces activity did not occur: Safety/medical concerns         Wheelchair     Assist Is the patient using a wheelchair?: Yes Type of Wheelchair: Manual Wheelchair activity did not occur: Refused  Wheelchair assist level: Dependent - Patient 0% Max wheelchair distance: 10'    Wheelchair 50 feet with 2 turns activity    Assist    Wheelchair 50 feet with 2 turns activity did  not occur: Safety/medical concerns       Wheelchair 150 feet activity     Assist  Wheelchair 150 feet activity did not occur: Safety/medical concerns       Blood pressure (!) 155/59, pulse 75, temperature 98.4 F (36.9 C), temperature source Oral, resp. rate 14, height 5\' 3"  (1.6 m), weight (!) 143.1 kg, SpO2 97 %.  Medical Problem List and Plan: 1. Functional deficits secondary to debility-for complicated medical stay-CHF exacerbation and bacteremia            - Patient may shower             -ELOS/Goals: 14 to 18 days [facility] PT and OT  Discharge home today  Continue CIR- PT, OT and SLP  Pt will need a bariatric bed with gel overlay mattress due to venous stasis ulcers and BMI- of 56- even though she's ~ 319 lbs, not 350, her BMI due to her height, make her too large to fit in a regular hospital- we've tried and she doesn't fit.   -Car transfer training completed  2.  Antithrombotics: -DVT/anticoagulation: Mechanical: Sequential compression devices,   below-knee bilateral lower extremities            -04/09/2022 Dopplers negative for DVT's 4/22-patient refusing to use SCDs however educated on need to use them at risk for DVT's  5/9- says is using some             -antiplatelet therapy: N/A due to GIB 3.  Left hip and shoulder pain with myalgias, pain management: Oxycodone as needed for diffuse pain. 04/08/2022-we will schedule tramadol 100 mg every 8 hours not every 6 hours due to renal issues-creatinine 1.31-continue oxycodone for pain as needed-was taking NSAIDs at home which caused a GI bleed. 04/12/2022-continue tramadol scheduled and oxycodone as needed-04/13/2022-will not go home on oxycodone just tramadol -04/17/2022 patient is well controlled under current pain regimen -4/30 No issues of pain overnight.  5/1- pt pain controlled- will be able to go home on Tramadol- not Oxy- she's having more pain due to exercise it appears,    5/2- adding Duloxetine 30 mg nightly for nerve  pain- explained will take a few days to kick in - will increase next week, or can be increased as of 04/23/22 at the earliest.  5/3- no side effects- but slept better- change to 60 mg on 5/5  5/5 Duloxetine dose increased to 60mg  daily 5/6 patient reports pain is well managed today continue current regimen. 5/7 Dr. Naaman Plummer decreased Duloxetine dose to 30 mg since patient had episode of dizziness yesterday, tolerating  new dose well so far.  5/9- will try again to increase Duloxetine- if gets dizzy again, will reduce dose-   5/10- sciatic nerve pain- will give Prednisone 20 mg qday x 3 days.   5/11- pain doing much better after prednisone 20 mg- will do for 2 more days.   5/12- verified with pt can go home with 7 days of tramadol, but no oxycodone at d/c.   5/16 R ankle pain-mild today, overall well controlled, continue current meds 4. Mood: LCSW to follow for evaluation and support. - Antipsychotic agents: Not applicable 5. Neuropsych: This patient is capable of making decisions on her own behalf. 6. Skin/Wound Care: Pressure relief measures. - Monitor for recurrent peripheral edema with increase in activity. Modify RF as indicated.    -04/08/2022 on low air loss mattress: Continue that along with 59M Tegaderm for ankles bilaterally per WOC    -04/12/2022 understands that she will have to remain on direct mattress     -Request that pressure is High    -04/15/22 switch to Kreg bed.    -04/16/2022-much improved pain with ROHO in chair and Kreg bed for coccyx pain    -04/17/2022 has adjusted to Kreg bed she continues to have 59M Tegaderm for ankles per WOC -5/6 wound is care and skin appear to be doing well.  Continue the 59M Tegaderm on ankles. 5/7 Ankle/leg swelling continues to improve.  5/11- Kreg bed to be replaced tomorrow due to being broken- will send home with Gel overlay bed-  5/12- will need bariatric gel overlay- since her BMI is so high.  7. Fluids/Electrolytes/Nutrition: Routine in and outs  with follow-up chemistries   -4/29 Continue to monitor chemistries. 5/1- CO2 up to 35- will need to likely wean O2 as tolerated             5/3- will check labs in AM to see where CO2 is.              5/4 Co2 31, continue current treatment 5/6 last CO2 is at 33 slightly slightly up from yesterday's value of 3.1 we will continue to monitor while looking at weaning oxygen as tolerated.  BUN and creatinine are slightly elevated.  We will continue to trend labs. 5/18 continues to be stable on RA     Component Value Date/Time   NA 140 05/06/2022 0535   K 4.2 05/06/2022 0535   CL 104 05/06/2022 0535   CO2 28 05/06/2022 0535   GLUCOSE 100 (H) 05/06/2022 0535   BUN 34 (H) 05/06/2022 0535   CREATININE 1.24 (H) 05/06/2022 0535   CALCIUM 9.5 05/06/2022 0535   GFRNONAA 46 (L) 05/06/2022 0535    8.  Enterococcus fecalis bacteremia: On ampicillin and ceftriaxone with end date of 04/25/22 -04/17/2022 continue ampicillin and ceftriaxone -04/18/22 Due to issues with PICC line overnight, pharmacy had to retime doses of both antibiotics. 5/7 Antibiotics to finish today, PICC will need to be discontinued prior to discharge.  5/9- need CBC before d/c PICC- to make sure WBC not elevated after finishing IV ABX- will also call ID to see if needs PO ABX now?  5/10- done with IV/PO ABX per ID  5/11- wrote to remove PICC 9.  ABLA due to UGIB: ON PPI BID.  We will add iron supplement with MiraLAX daily.   -Check anemia panel is question vitamin B12 and iron deficiency.    -4/20-has iron deficiency    -4/21-on IV iron as well as PO    -  4/24 Hgb up to 8.2    -Encouraged appropriate diet, with supplements as above.     -4/29 Hgb stable at 9.0, continue to monitor     -4/30 qMonday labs tomorrow       5/1- Hb 9.1- stable      5/5 HBG 9.6- stable, monitor      5/6 monitor with qMonday labs      5/13 Hemoglobin was up to 10.6 on 5/10      5/16  12.2 hemoglobin improved, follow    CBC    Component Value  Date/Time   WBC 10.5 05/03/2022 1221   RBC 4.52 05/03/2022 1221   HGB 12.2 05/03/2022 1221   HCT 39.0 05/03/2022 1221   PLT 361 05/03/2022 1221   MCV 86.3 05/03/2022 1221   MCH 27.0 05/03/2022 1221   MCHC 31.3 05/03/2022 1221   RDW 16.7 (H) 05/03/2022 1221   LYMPHSABS 1.0 04/28/2022 0434   MONOABS 0.4 04/28/2022 0434   EOSABS 0.2 04/28/2022 0434   BASOSABS 0.1 04/28/2022 0434   10. CAF: Monitor HR TID-Continue metoprolol. 4/21-rate controlled-continue regimen 5/9- rate controlled-  5/11- BP a little elevated- monitor trend 5/17 Rate has been well controlled    05/06/2022    5:00 AM 05/06/2022    3:51 AM 05/05/2022    8:10 PM  Vitals with BMI  Weight 315 lbs 8 oz    BMI [id number]    Systolic  [id number]  Diastolic  59 69  Pulse  75 78    11. Chronic diastolic CHF/fluid overload: Heart Healthy diet. Strict I/O.   --2,000 cc (IV + PO) FR-will change to 1,000 cc po as getting IV every 4 hours. --Monitor for orthostatic changes (due to prior history) --Continue Lasix, metoprolol, and spironolactone. -4/27 weight very off-67 kg incorrect, nursing requested to fix. 4/28 weight very inconsistent, may be due to technique, will see if can get more stable. -4/29 Wt 148.1 kg, continue to monitor daily weights. 5/3- Weight 145.8 kg- stable             5/5- Wt 146.6 overall stable, continue to monitor 5/6 weight today is 139.3 kg which is considerably down from last weight on 54146.6 kg. 5/7 Pedal and ankle edema continues to decrease now, 0-+1.  5/9- weight overall stable- except on 5/6- will con't to monitor  5/17 weights remain stable      Filed Weights    Filed Weights   05/03/22 0428 05/03/22 0429 05/06/22 0500  Weight: (!) 140.5 kg (!) 139.8 kg (!) 143.1 kg    12.  Restless restless leg syndrome: Continue with Requip 2 mg twice daily and 3 mg at bedtime. 13.  OSA/OHS: Hypercarbia noted but improved from 43-35. - Encourage BiPAP use as able to tolerate it for 1 to 3 hours  throughout the night.   - Check ABG for baseline/BiPAP justification if needed at discharge   - Wean oxygen as tolerated.  Encourage pulmonary hygiene.   - 04/08/2022-patient having husband bring it from    -04/12/22 on 2 L O2 nasal cannula currently    -04/15/2022-home setting and social work notes-we will let our team know    -04/16/22-let patient know as well    -04/17/2022 patient reports doing CPAP overnight however having some issues with claustrophobia and being able to take the mask on and off due to limited range of motion in left arm.  -4/30 Pt refused CPAP overnight.  5/3- wore CPAP almost all night- doing  better 5/6 patient adjusting to CPAP. 5/7 Reports mouth and face dryness with CPAP which presents a challenge to use. 5/18 advised continued use of CPAP 14.  Morbid obesity: BMI 60.77.  Educate on appropriate diet to promote mobility and health.    -04/08/2022-BMI down slightly to 56.15 but could be due to today change    -04/10/2022 BMI 53-due to the change in bed.     -04/13/2022-BMI 54.21     -04/15/2022-BMI in computer is 26 asking nursing to fax     -04/17/2022-Wt 148.1 kg, continue to monitor daily weights.  5/10- Weight 144.6 kg-   5/15: weight 139.8kg, decrease lasix to 40mg  daily 15.  Peripheral edema: Greatly improved due to elevation and limited mobility. - Educated on importance of low-salt diet and elevation when seated. Continue protein supplement for likely low protein stores. Continue compression stockings or Ace wrap to prevent worsening of edema. 04/08/2022 edema looks better this morning 2+ edema continue teds -04/17/2022 still has 1+1 to +2 edema continue measures as outlined above.  Patient also encouraged to have high-protein diet. -4/30 +1-2 pedal edema today 5/1 stable LE edema. 5/6 +1 mostly LE edema patient even notices that she feels like she has less swelling.  Edema is nonpitting. 5/7 0-+1 edema pedal and ankle, greatly improved. 5/11- no change- still 1+  edema 16.  Wounds 04/08/2022-on low air loss mattress-patient said not as big as 1 upstairs-explained to have a different hospital and already on bariatric low-air-loss mattress that we have. -04/17/2022 K reticulocyte bed also seeing wound care consult for care bilateral lower extremity wounds 41M Tegaderm -4/30 continue above treatment 17.  Bilateral lower extremity wounds-venous stasis ulcers-not pressure       -04/09/22-continue 41M Tegaderm-can be left on for 21 to 28 days per wound ostomy care nurse       -04/12/2022 continue air mattress see discussion above       -04/17/2022 continue both air mattress and 41M Tegaderm       -4/30 Continue Tegaderm and air mattress 18.  Urinary and bowel incontinence -04/11/2022 better with female urine normal continue current regimen -4/49/2023 patient states female urinal is working well for her 4/30 Continue to use female urinal 19. PICC line unable to flush or infuse -4/29 Received call from bedside nurse Jimmie Molly that PICC line was not working. -IV team was consulted and they were also unable to achieve use of line. This line was not placed here at [facility] but at [facility] -04/18/22 IV team and Radiology recommended to have 1D-CXR to confirm placement.  Since there was still concern about possible "kink" of line in arm, IV-team recommended to have also 2D CXR.   -2D CXR showed placement in distal SVC vs mid SVC in 1D view.  Due to difference, PICC line RN was consulted to confirm that the placement was correct and line could be used. - Per PICC line RN line may be used for duration of antibiotic time course through 04/25/22.  Since the line is abutting the vein, there may continue to be intermittent episodes of the line being difficult to infuse. -Per PICC RN, since line was not placed at [facility], the line may only be removed but not exchanged. -Pt also has a PIV which may be a good idea to keep to ensure antibiotics can still be administered even if there  are PICC line issues persist. 5/10- will d/c PICC 19. Azotemia/AKI Cr 1.14 5/4 -Will decrease lasix to 40mg   -5/5  scr down to 1.08, continue current treatment -5/6 continue to monitor and trend with q. Monday labs.  5/9- looking good- con't regimen  5/15: Cr worsening ,  decrease Lasix to 40mg  and repeat creatinine tomorrow  5/16 Cr improved today  1.45 but still elevated, encourage PO fluids, hold lasix tomorrow  5/17 Cr improved to 1.39 Bun down to 40, lasix held again tomrrow, fluids encouraged  5/18 Cr improved to 1.24, lasix held and advised to hold home more time tomorrow, f/u labs outpatient with PCP  Lab Results  Component Value Date   CREATININE 1.24 (H) 05/06/2022   CREATININE 1.39 (H) 05/05/2022   CREATININE 1.45 (H) 05/04/2022    20. Hypotension: decrease lasix to 40mg  daily.   -Improved, continue to f/u as outpatient     LOS: 29 days A FACE TO [place], MD 05/06/2022, 7:36 AM

## 2022-05-06 NOTE — Progress Notes (Signed)
Inpatient Rehabilitation Care Coordinator Discharge Note   Patient Details  Name: Chemika Nightengale MRN: [id number] Date of Birth: January 28, 1951   Discharge location: D/c to home with support from husband  Length of Stay: 28 days  Discharge activity level: Supervision  Home/community participation: LImited  Patient response XT:[email or web address] Literacy - How often do you need to have someone help you when you read instructions, pamphlets, or other written material from your doctor or pharmacy?: Never  Patient response WN:IOEVOJ Isolation - How often do you feel lonely or isolated from those around you?: Never  Services provided included: MD, RD, PT, OT, RN, CM, TR, Pharmacy, Neuropsych, SW  Financial Services:  Field seismologist Utilized: Medicare    Choices offered to/list presented to: Yes  Follow-up services arranged:  DME, Home Health Home Health Agency: [facility] for HHPT/OT/SN/Aide    DME : [facility] for hospital bed, bariatric RW and BSC    Patient response to transportation need: Is the patient able to respond to transportation needs?: Yes In the past 12 months, has lack of transportation kept you from medical appointments or from getting medications?: No In the past 12 months, has lack of transportation kept you from meetings, work, or from getting things needed for daily living?: No  Comments (or additional information):  Patient/Family verbalized understanding of follow-up arrangements:  Yes  Individual responsible for coordination of the follow-up plan: Contact pt or pt huband  Confirmed correct DME delivered: Gretchen Short 05/06/2022    Gretchen Short

## 2022-05-06 NOTE — Progress Notes (Signed)
Patient being DC home today. Patient requests PRN pain medications prior to discharge. Patient and husband has no questions or concerns regarding going home.

## 2022-05-07 DIAGNOSIS — I5032 Chronic diastolic (congestive) heart failure: Secondary | ICD-10-CM

## 2022-05-07 DIAGNOSIS — N179 Acute kidney failure, unspecified: Secondary | ICD-10-CM

## 2022-05-07 DIAGNOSIS — G629 Polyneuropathy, unspecified: Secondary | ICD-10-CM

## 2022-05-07 DIAGNOSIS — I482 Chronic atrial fibrillation, unspecified: Secondary | ICD-10-CM

## 2022-05-07 DIAGNOSIS — G8929 Other chronic pain: Secondary | ICD-10-CM

## 2022-05-07 DIAGNOSIS — D649 Anemia, unspecified: Secondary | ICD-10-CM

## 2022-05-07 NOTE — Discharge Summary (Incomplete)
Physician Discharge Summary  Patient ID: Stacy Hanson MRN: [id number] DOB/AGE: 1951-12-12 71 y.o.  Admit date: 04/07/2022 Discharge date: 05/07/2022  Discharge Diagnoses:  Principal Problem:   Debility Active Problems:   Anemia   Chronic diastolic CHF (congestive heart failure) (HCC)   Morbid obesity (HCC)   Chronic atrial fibrillation (HCC)   Acute renal failure (HCC)   Hip pain, chronic   Neuropathy   Discharged Condition: good  Significant Diagnostic Studies: DG Chest 2 View  Result Date: 04/18/2022 CLINICAL DATA:  Check PICC placement EXAM: CHEST - 2 VIEW COMPARISON:  04/18/2022 FINDINGS: Cardiac shadow is prominent but stable. Aortic calcifications are again seen. Right-sided PICC is noted in the proximal superior vena cava and may abut the vein wall given its positioning. Lungs are well aerated with mild vascular congestion and interstitial edema. IMPRESSION: PICC on the right is seen in the proximal superior vena cava but may abut the vein wall given its positioning. Electronically Signed   By: Inez Catalina M.D.   On: 04/18/2022 01:40   DG CHEST PORT 1 VIEW  Result Date: 04/18/2022 CLINICAL DATA:  PICC line occluded EXAM: PORTABLE CHEST 1 VIEW COMPARISON:  04/07/2022 FINDINGS: Cardiomegaly with mild to moderate interstitial edema. No definite pleural effusions. No pneumothorax. Thoracic aortic atherosclerosis. Right arm PICC terminates in the mid SVC. IMPRESSION: Cardiomegaly with mild to moderate interstitial edema. No definite pleural effusions. Right arm PICC terminates in the mid SVC. Electronically Signed   By: Julian Hy M.D.   On: 04/18/2022 00:17   VAS [place] LOWER EXTREMITY VENOUS (DVT)  Result Date: 04/08/2022  Lower Venous DVT Study Patient Name:  KERITH VENARD  Date of Exam:   04/08/2022 Medical Rec #: [id number]          Accession #:    [id number] Date of Birth: 04/03/1951          Patient Gender: F Patient Age:   68 years Exam Location:  [facility] Procedure:      VAS [place] LOWER EXTREMITY VENOUS (DVT) Referring Phys: Jonalyn Sedlak --------------------------------------------------------------------------------  Indications: Edema.  Risk Factors: Immobility, history of a-fib, CHF. Limitations: Body habitus. Comparison Study: No prior studies. Performing Technologist: Darlin Coco RDMS, RVT  Examination Guidelines: A complete evaluation includes B-mode imaging, spectral Doppler, color Doppler, and power Doppler as needed of all accessible portions of each vessel. Bilateral testing is considered an integral part of a complete examination. Limited examinations for reoccurring indications may be performed as noted. The reflux portion of the exam is performed with the patient in reverse Trendelenburg.  +---------+---------------+---------+-----------+----------+--------------+ RIGHT    CompressibilityPhasicitySpontaneityPropertiesThrombus Aging +---------+---------------+---------+-----------+----------+--------------+ CFV      Full           Yes      Yes                                 +---------+---------------+---------+-----------+----------+--------------+ SFJ      Full                                                        +---------+---------------+---------+-----------+----------+--------------+ FV Prox  Full                                                        +---------+---------------+---------+-----------+----------+--------------+  FV Mid   Full                                                        +---------+---------------+---------+-----------+----------+--------------+ FV DistalFull                                                        +---------+---------------+---------+-----------+----------+--------------+ PFV      Full                                                        +---------+---------------+---------+-----------+----------+--------------+ POP      Full           Yes      Yes                                  +---------+---------------+---------+-----------+----------+--------------+ PTV      Full                                                        +---------+---------------+---------+-----------+----------+--------------+ PERO     Full                                                        +---------+---------------+---------+-----------+----------+--------------+ Gastroc  Full                                                        +---------+---------------+---------+-----------+----------+--------------+   +---------+---------------+---------+-----------+----------+--------------+ LEFT     CompressibilityPhasicitySpontaneityPropertiesThrombus Aging +---------+---------------+---------+-----------+----------+--------------+ CFV      Full           Yes      Yes                                 +---------+---------------+---------+-----------+----------+--------------+ SFJ      Full                                                        +---------+---------------+---------+-----------+----------+--------------+ FV Prox  Full           Yes      Yes                                 +---------+---------------+---------+-----------+----------+--------------+  FV Mid   Full                                                        +---------+---------------+---------+-----------+----------+--------------+ FV DistalFull                                                        +---------+---------------+---------+-----------+----------+--------------+ PFV                                                   Not visualized +---------+---------------+---------+-----------+----------+--------------+ POP      Full           Yes      Yes                                 +---------+---------------+---------+-----------+----------+--------------+ PTV      Full                                                         +---------+---------------+---------+-----------+----------+--------------+ PERO     Full                                                        +---------+---------------+---------+-----------+----------+--------------+ Gastroc  Full                                                        +---------+---------------+---------+-----------+----------+--------------+     Summary: RIGHT: - There is no evidence of deep vein thrombosis in the lower extremity.  - No cystic structure found in the popliteal fossa.  LEFT: - There is no evidence of deep vein thrombosis in the lower extremity. However, portions of this examination were limited- see technologist comments above.  - No cystic structure found in the popliteal fossa.  *See table(s) above for measurements and observations. Electronically signed by Monica Martinez MD on 04/08/2022 at 8:47:51 PM.    Final     Labs:  Basic Metabolic Panel: Recent Labs  Lab 05/03/22 1221 05/04/22 0532 05/05/22 0742 05/06/22 0535  NA 139 139 140 140  K 3.9 4.2 4.1 4.2  CL 100 101 99 104  CO2 28 30 30 28   GLUCOSE 127* 111* 143* 100*  BUN 45* 48* 40* 34*  CREATININE 1.62* 1.45* 1.39* 1.24*  CALCIUM 9.5 9.3 9.6 9.5    CBC:    Latest Ref Rng & Units 05/03/2022   12:21 PM 04/28/2022    4:34 AM 04/23/2022    4:27  AM  CBC  WBC 4.0 - 10.5 K/uL 10.5   7.6   7.5    Hemoglobin 12.0 - 15.0 g/dL 12.2   10.6   9.6    Hematocrit 36.0 - 46.0 % 39.0   34.4   31.5    Platelets 150 - 400 K/uL 361   297   273      Filed Weights   05/03/22 0428 05/03/22 0429 05/06/22 0500  Weight: (!) 140.5 kg (!) 139.8 kg (!) 143.1 kg    CBG: No results for input(s): GLUCAP in the last 168 hours.  Brief HPI:   Stacy Hanson is a 71 y.o. female with history of chronic diastolic CHF, lymphedema BLE, morbid obesity, OSA, A-fib with melanotic stools despite DC of Eliquis and was originally admitted to [facility] on [facility]07[facility]2022 with severe anemia requiring multiple  units PRBCs and was found to have gastric and duodenal ulcer with pigmented flat spot that was treated with Endo Clip by Dr. Ventura Bruns.  She was also found to have Enterococcus faecalis bacteremia as well as inflammatory stranding of pannus and abdominal wall concerning for possible cellulitis.  She was started on IV ampicillin and ceftriaxone with recommendation to continue for 6 weeks with end date of 05/07.  Fluid overload was managed with Lasix drip as well as 1500 cc fluid restriction.   She was transferred to [facility] 03/30 for medical management.  She was transitioned to p.o. Lasix and did have recurrent drop in hemoglobin to 7.1 requiring 1 units PRBC.  She was encouraged to use BiPAP and oxygen was in process of being weaned off.  BLE wounds were healed and being treated with Tegaderm and MASD bilateral groin and pannus were being managed with local care.  She was making progress with therapy when intensive rehab program was recommended due to functional decline.   Hospital Course: Genelda Giambra was admitted to rehab 04/07/2022 for inpatient therapies to consist of PT and OT at least three hours five days a week. Past admission physiatrist, therapy team and rehab RN have worked together to provide customized collaborative inpatient rehab.  Her respiratory status has improved and she was weaned off oxygen during his stay.  She was encouraged on pulmonary hygiene as well as use of CPAP.  Hypercarbia has improved.  Has been provided a machine from home and by time of discharge she was using this consistently.    BLE Dopplers done past admission were negative for DVT and SCDs were used for DVT prophylaxis.Protein supplements were added to help with low protein stores and peripheral edema.  She was also advised on use of elevation, low-salt diet as well as compression stockings.  30M Tegaderm was used for venous stasis ulcers and she is completing second round of treatment.  BLE edema has  greatly improved and she is to follow-up with wound care for further input on stasis ulcers which have almost resolved. She completed her antibiotic course on 05/08 without any side effects.  Iron deficiency was supplemented.  Her blood pressures were monitored on TID basis and have been well controlled.    Weights were monitored daily and CHF is compensated.  She was maintained on fluid restriction and did develop acute renal failure.  Furosemide was held x3 days and fluid restrictions were discontinued.  Follow-up labs shows AKI is resolving and she was advised to continue increasing fluid intake as well as continuing low-salt diet at discharge.  Heart rate was monitored on 3  times daily basis and has been controlled on current regimen.  She has tolerated increase in activity without any cardiac symptoms.  Chronic left shoulder and hip pain as well as diffuse myalgias were treated with addition of Cymbalta which was titrated up to 45 mg as well as tramadol 100 mg 3 times daily.  She was educated on weaning to 50 to 100 mg 3 times daily after discharge and is to follow-up with neurology for further pain management.  Diabetes has been monitored with ac/hs CBG checks and SSI was use prn for tighter BS control.    Rehab course: During patient's stay in rehab weekly team conferences were held to monitor patient's progress, set goals and discuss barriers to discharge. At admission, patient required total assist + 2 for ADL tasks and total assist with mobility. She  has had improvement in activity tolerance, balance, postural control as well as ability to compensate for deficits.      Discharge disposition: 01-Home or Self Care  Diet: Heart Healthy  Special Instructions: Monitor weights daily Encourage fluid intake.  Allergies as of 05/06/2022       Reactions   Amlodipine Other (See Comments)   Unknown - listed on MAR   Gabapentin Other (See Comments)   Question worsening of  edema   Seroquel  [quetiapine] Other (See Comments)   halucinations        Medication List     STOP taking these medications    ampicillin 2 g in sodium chloride 0.9 % 100 mL   cefTRIAXone 2 g in sodium chloride 0.9 % 100 mL   docusate sodium 100 MG capsule Commonly known as: COLACE   Enema 7-19 GM/118ML Enem   metoprolol tartrate 25 MG tablet Commonly known as: LOPRESSOR   ondansetron 4 MG tablet Commonly known as: ZOFRAN   polyethylene glycol 17 g packet Commonly known as: MIRALAX / GLYCOLAX Replaced by: polyethylene glycol powder 17 GM/SCOOP powder   potassium chloride SA 20 MEQ tablet Commonly known as: KLOR-CON M   PROSTAT PO   sodium polystyrene 15 GM/60ML suspension Commonly known as: KAYEXALATE       TAKE these medications    acetaminophen 325 MG tablet Commonly known as: TYLENOL Take 650 mg by mouth every 6 (six) hours as needed for mild pain or fever.   CertaVite/Antioxidants Tabs Take 1 tablet by mouth daily.   Digestive Advantage Caps Take 1 capsule by mouth daily.   DULoxetine 60 MG capsule Commonly known as: CYMBALTA Take 1 capsule (60 mg total) by mouth at bedtime.   feeding supplement (ENSURE COMPLETE) Liqd Take 237 mLs by mouth daily.   fenofibrate 160 MG tablet Take 1 tablet (160 mg total) by mouth daily. What changed:  medication strength how much to take   furosemide 40 MG tablet Commonly known as: LASIX Take 1 tablet (40 mg total) by mouth daily. What changed: when to take this   magnesium oxide 400 MG tablet Commonly known as: MAG-OX Take 1 tablet (400 mg total) by mouth daily. What changed: when to take this   methocarbamol 500 MG tablet Commonly known as: ROBAXIN Take 1 tablet (500 mg total) by mouth 3 (three) times daily. What changed: when to take this   oxyCODONE 5 MG immediate release tablet Commonly known as: Oxy IR/ROXICODONE Take 1 tablet (5 mg total) by mouth every 6 (six) hours as needed for severe pain (pain). What  changed:  when to take this reasons to take this Notes to  patient: Limit to one pill per day AS NEEDED. YOU CAN USE THIS OR HYDROCODONE AS NEEDED BUT NOT TOGETHER   pantoprazole 40 MG tablet Commonly known as: PROTONIX Take 1 tablet (40 mg total) by mouth in the morning and at bedtime.   Poly-Iron 150 Forte 150-0.025-1 MG Caps Generic drug: Iron Polysacch Cmplx-B12-FA Take 150 mg by mouth daily.   polyethylene glycol powder 17 GM/SCOOP powder Commonly known as: GLYCOLAX/MIRALAX Take 17 g by mouth daily. Replaces: polyethylene glycol 17 g packet   rOPINIRole 1 MG tablet Commonly known as: REQUIP Take 2mg  (two tablets) at 6 (six) AM, Take 2mg  (two tablets) at 2 (two) PM, and Take 3mg  (three tablets) at 10 (ten) PM. What changed: additional instructions   saccharomyces boulardii 250 MG capsule Commonly known as: FLORASTOR Take 1 capsule (250 mg total) by mouth 2 (two) times daily. Purchase over the counter   sodium chloride 0.65 % Soln nasal spray Commonly known as: OCEAN Place 1 spray into both nostrils daily.   spironolactone 25 MG tablet Commonly known as: ALDACTONE Take 1 tablet (25 mg total) by mouth daily.   TAB-A-VITE/BETA CAROTENE PO Take 1 tablet by mouth daily.   traMADol 50 MG tablet Commonly known as: ULTRAM Take 1-2 tablets (50-100 mg total) by mouth every 8 (eight) hours. What changed:  how much to take when to take this reasons to take this   traZODone 50 MG tablet Commonly known as: DESYREL Take 0.5-1 tablets (25-50 mg total) by mouth at bedtime as needed for sleep.        Follow-up Information     Karleen Hampshire., MD Follow up.   Specialty: Internal Medicine Why: call for follow up appointment Contact information: [id number] [place] [address] [phone]         Jennye Boroughs, MD. Call.   Specialty: Physical Medicine and Rehabilitation Why: As needed Contact information: [address] [place] [address] [phone]         Trula Slade, MD. Call in 1 day(s).   Specialty: Cardiology Why: for follow up appointment Contact information: [place] [address] [phone]         Dewaine Oats, MD Follow up.   Specialty: Gastroenterology Why: call for follow up appointment--after GI bleed Contact information: [address] [place] [address] [phone]         Celene Squibb., MD Follow up.   Specialty: Neurology Why: call for follow up appointment and refills on pain medications Contact information: [place] [id number] [place] [address] [phone]                 Signed: Bary Leriche 05/07/2022, 11:31 PM

## 2022-05-07 NOTE — Discharge Summary (Signed)
Physician Discharge Summary  Patient ID: Stacy Hanson MRN: [id number] DOB/AGE: November 14, 1951 71 y.o.  Admit date: 04/07/2022 Discharge date: 05/06/2022  Discharge Diagnoses:  Principal Problem:   Debility Active Problems:   Anemia   Chronic diastolic CHF (congestive heart failure) (HCC)   Morbid obesity (HCC)   Chronic atrial fibrillation (HCC)   Acute renal failure (HCC)   Hip pain, chronic   Neuropathy   Myalgia   Discharged Condition: good  Significant Diagnostic Studies: DG Chest 2 View  Result Date: 04/18/2022 CLINICAL DATA:  Check PICC placement EXAM: CHEST - 2 VIEW COMPARISON:  04/18/2022 FINDINGS: Cardiac shadow is prominent but stable. Aortic calcifications are again seen. Right-sided PICC is noted in the proximal superior vena cava and may abut the vein wall given its positioning. Lungs are well aerated with mild vascular congestion and interstitial edema. IMPRESSION: PICC on the right is seen in the proximal superior vena cava but may abut the vein wall given its positioning. Electronically Signed   By: Inez Catalina M.D.   On: 04/18/2022 01:40   DG CHEST PORT 1 VIEW  Result Date: 04/18/2022 CLINICAL DATA:  PICC line occluded EXAM: PORTABLE CHEST 1 VIEW COMPARISON:  04/07/2022 FINDINGS: Cardiomegaly with mild to moderate interstitial edema. No definite pleural effusions. No pneumothorax. Thoracic aortic atherosclerosis. Right arm PICC terminates in the mid SVC. IMPRESSION: Cardiomegaly with mild to moderate interstitial edema. No definite pleural effusions. Right arm PICC terminates in the mid SVC. Electronically Signed   By: Julian Hy M.D.   On: 04/18/2022 00:17   VAS [place] LOWER EXTREMITY VENOUS (DVT)  Result Date: 04/08/2022  Lower Venous DVT Study Patient Name:  Stacy Hanson  Date of Exam:   04/08/2022 Medical Rec #: [id number]          Accession #:    [id number] Date of Birth: 1951/06/25          Patient Gender: F Patient Age:   53 years Exam Location:  [facility] Procedure:      VAS [place] LOWER EXTREMITY VENOUS (DVT) Referring Phys: Jerzey Komperda --------------------------------------------------------------------------------  Indications: Edema.  Risk Factors: Immobility, history of a-fib, CHF. Limitations: Body habitus. Comparison Study: No prior studies. Performing Technologist: Darlin Coco RDMS, RVT  Examination Guidelines: A complete evaluation includes B-mode imaging, spectral Doppler, color Doppler, and power Doppler as needed of all accessible portions of each vessel. Bilateral testing is considered an integral part of a complete examination. Limited examinations for reoccurring indications may be performed as noted. The reflux portion of the exam is performed with the patient in reverse Trendelenburg.  +---------+---------------+---------+-----------+----------+--------------+ RIGHT    CompressibilityPhasicitySpontaneityPropertiesThrombus Aging +---------+---------------+---------+-----------+----------+--------------+ CFV      Full           Yes      Yes                                 +---------+---------------+---------+-----------+----------+--------------+ SFJ      Full                                                        +---------+---------------+---------+-----------+----------+--------------+ FV Prox  Full                                                        +---------+---------------+---------+-----------+----------+--------------+  FV Mid   Full                                                        +---------+---------------+---------+-----------+----------+--------------+ FV DistalFull                                                        +---------+---------------+---------+-----------+----------+--------------+ PFV      Full                                                        +---------+---------------+---------+-----------+----------+--------------+ POP      Full           Yes       Yes                                 +---------+---------------+---------+-----------+----------+--------------+ PTV      Full                                                        +---------+---------------+---------+-----------+----------+--------------+ PERO     Full                                                        +---------+---------------+---------+-----------+----------+--------------+ Gastroc  Full                                                        +---------+---------------+---------+-----------+----------+--------------+   +---------+---------------+---------+-----------+----------+--------------+ LEFT     CompressibilityPhasicitySpontaneityPropertiesThrombus Aging +---------+---------------+---------+-----------+----------+--------------+ CFV      Full           Yes      Yes                                 +---------+---------------+---------+-----------+----------+--------------+ SFJ      Full                                                        +---------+---------------+---------+-----------+----------+--------------+ FV Prox  Full           Yes      Yes                                 +---------+---------------+---------+-----------+----------+--------------+  FV Mid   Full                                                        +---------+---------------+---------+-----------+----------+--------------+ FV DistalFull                                                        +---------+---------------+---------+-----------+----------+--------------+ PFV                                                   Not visualized +---------+---------------+---------+-----------+----------+--------------+ POP      Full           Yes      Yes                                 +---------+---------------+---------+-----------+----------+--------------+ PTV      Full                                                         +---------+---------------+---------+-----------+----------+--------------+ PERO     Full                                                        +---------+---------------+---------+-----------+----------+--------------+ Gastroc  Full                                                        +---------+---------------+---------+-----------+----------+--------------+     Summary: RIGHT: - There is no evidence of deep vein thrombosis in the lower extremity.  - No cystic structure found in the popliteal fossa.  LEFT: - There is no evidence of deep vein thrombosis in the lower extremity. However, portions of this examination were limited- see technologist comments above.  - No cystic structure found in the popliteal fossa.  *See table(s) above for measurements and observations. Electronically signed by Monica Martinez MD on 04/08/2022 at 8:47:51 PM.    Final     Labs:  Basic Metabolic Panel: Recent Labs  Lab 05/03/22 1221 05/04/22 0532 05/05/22 0742 05/06/22 0535  NA 139 139 140 140  K 3.9 4.2 4.1 4.2  CL 100 101 99 104  CO2 28 30 30 28   GLUCOSE 127* 111* 143* 100*  BUN 45* 48* 40* 34*  CREATININE 1.62* 1.45* 1.39* 1.24*  CALCIUM 9.5 9.3 9.6 9.5    CBC:    Latest Ref Rng & Units 05/03/2022   12:21 PM 04/28/2022    4:34 AM 04/23/2022    4:27  AM  CBC  WBC 4.0 - 10.5 K/uL 10.5   7.6   7.5    Hemoglobin 12.0 - 15.0 g/dL 12.2   10.6   9.6    Hematocrit 36.0 - 46.0 % 39.0   34.4   31.5    Platelets 150 - 400 K/uL 361   297   273      Filed Weights   05/03/22 0428 05/03/22 0429 05/06/22 0500  Weight: (!) 140.5 kg (!) 139.8 kg (!) 143.1 kg    CBG: No results for input(s): GLUCAP in the last 168 hours.  Brief HPI:   Stacy Hanson is a 71 y.o. female with history of chronic diastolic CHF, lymphedema BLE, morbid obesity, OSA, A-fib with melanotic stools despite DC of Eliquis and was originally admitted to [facility] on 03/07/2022 with severe anemia requiring multiple  units PRBCs and was found to have gastric and duodenal ulcer with pigmented flat spot that was treated with Endo Clip by Dr. Ventura Bruns.  She was also found to have Enterococcus faecalis bacteremia as well as inflammatory stranding of pannus and abdominal wall concerning for possible cellulitis.  She was started on IV ampicillin and ceftriaxone with recommendation to continue for 6 weeks with end date of 05/07.  Fluid overload was managed with Lasix drip as well as 1500 cc fluid restriction.   She was transferred to [facility] 03/30 for medical management.  She was transitioned to p.o. Lasix and did have recurrent drop in hemoglobin to 7.1 requiring 1 units PRBC.  She was encouraged to use BiPAP and oxygen was in process of being weaned off.  BLE wounds were healed and being treated with Tegaderm and MASD bilateral groin and pannus were being managed with local care.  She was making progress with therapy when intensive rehab program was recommended due to functional decline.   Hospital Course: Stacy Hanson was admitted to rehab 04/07/2022 for inpatient therapies to consist of PT and OT at least three hours five days a week. Past admission physiatrist, therapy team and rehab RN have worked together to provide customized collaborative inpatient rehab.  Her respiratory status has improved and she was weaned off oxygen during his stay.  She was encouraged on pulmonary hygiene as well as use of CPAP.  Hypercarbia has improved.  Has been provided a machine from home and by time of discharge she was using this consistently.  BLE Dopplers done past admission were negative for DVT and SCDs were used for DVT prophylaxis.Protein supplements were added to help with low protein stores and peripheral edema.  She was also advised on use of elevation, low-salt diet as well as compression stockings.    37M Tegaderm was used for venous stasis ulcers and she is completing second round of treatment.  BLE edema has  greatly improved and she is to follow-up with wound care for further input on stasis ulcers which have almost resolved. She completed her antibiotic course on 05/08 without any side effects.  Follow up CBC showed steady improvement in H/H and anemia has resolved.  Her weights were monitored daily and CHF has been compensated.  She was maintained on fluid restriction with lasix bid. She did develop acute renal failure therefore lasix was held x3 days and fluid restrictions were discontinued.  Follow-up labs shows AKI is resolving and she was advised to continue increasing fluid intake as well as continuing low-salt diet at discharge. Weight is 143 kg and she was advised to continue  daily wts and follow up with cardiology for upward trend.   Heart rate was monitored on 3 times daily basis and has been controlled on current regimen.  She has tolerated increase in activity without any cardiac symptoms.  Chronic left shoulder and hip pain as well as diffuse myalgias was managed with tramadol 100 mg 3 times daily with prn oxycodone and cymbalta was added to help with myalgias/neuropathy.  She was educated on weaning tramadol to 50 to 100 mg TID after discharge and is to follow-up with neurology for further pain management. Mood has been stable and she has made good gains during her stay. She requires CG to min assist and will continue to receive follow up [place] [place] [facility] [place]  [place] after discharge.    Rehab course: During patient's stay in rehab weekly team conferences were held to monitor patient's progress, set goals and discuss barriers to discharge. At admission, patient required total assist + 2 for ADL tasks and total assist with mobility. She  has had improvement in activity tolerance, balance, postural control as well as ability to compensate for deficits.  She requires supervision to min assist for upper body care and max assist for lower body care. She requires contact-guard to min  assist for transfers and is able to ambulate 40 feet with verbal cues and supervision.  Family education was completed with husband.   Discharge disposition: 01-Home or Self Care  Diet: Heart Healthy  Special Instructions: Monitor weights daily Repeat BMET in 1-2 weeks to follow up on renal status.    Allergies as of 05/06/2022       Reactions   Amlodipine Other (See Comments)   Unknown - listed on MAR   Gabapentin Other (See Comments)   Question worsening of  edema   Seroquel [quetiapine] Other (See Comments)   halucinations        Medication List     STOP taking these medications    ampicillin 2 g in sodium chloride 0.9 % 100 mL   cefTRIAXone 2 g in sodium chloride 0.9 % 100 mL   docusate sodium 100 MG capsule Commonly known as: COLACE   Enema 7-19 GM/118ML Enem   metoprolol tartrate 25 MG tablet Commonly known as: LOPRESSOR   ondansetron 4 MG tablet Commonly known as: ZOFRAN   polyethylene glycol 17 g packet Commonly known as: MIRALAX / GLYCOLAX Replaced by: polyethylene glycol powder 17 GM/SCOOP powder   potassium chloride SA 20 MEQ tablet Commonly known as: KLOR-CON M   PROSTAT PO   sodium polystyrene 15 GM/60ML suspension Commonly known as: KAYEXALATE       TAKE these medications    acetaminophen 325 MG tablet Commonly known as: TYLENOL Take 650 mg by mouth every 6 (six) hours as needed for mild pain or fever.   CertaVite/Antioxidants Tabs Take 1 tablet by mouth daily.   Digestive Advantage Caps Take 1 capsule by mouth daily.   DULoxetine 60 MG capsule Commonly known as: CYMBALTA Take 1 capsule (60 mg total) by mouth at bedtime.   feeding supplement (ENSURE COMPLETE) Liqd Take 237 mLs by mouth daily.   fenofibrate 160 MG tablet Take 1 tablet (160 mg total) by mouth daily. What changed:  medication strength how much to take   furosemide 40 MG tablet Commonly known as: LASIX Take 1 tablet (40 mg total) by mouth daily. What  changed: when to take this   magnesium oxide 400 MG tablet Commonly known as: MAG-OX  Take 1 tablet (400 mg total) by mouth daily. What changed: when to take this   methocarbamol 500 MG tablet Commonly known as: ROBAXIN Take 1 tablet (500 mg total) by mouth 3 (three) times daily. What changed: when to take this   oxyCODONE 5 MG immediate release tablet--Rx # 21 pills Commonly known as: Oxy IR/ROXICODONE Take 1 tablet (5 mg total) by mouth every 6 (six) hours as needed for severe pain (pain). What changed:  when to take this reasons to take this Notes to patient: Limit to one pill per day AS NEEDED. YOU CAN USE THIS OR HYDROCODONE AS NEEDED BUT NOT TOGETHER   pantoprazole 40 MG tablet Commonly known as: PROTONIX Take 1 tablet (40 mg total) by mouth in the morning and at bedtime.   Poly-Iron 150 Forte 150-0.025-1 MG Caps Generic drug: Iron Polysacch Cmplx-B12-FA Take 150 mg by mouth daily.   polyethylene glycol powder 17 GM/SCOOP powder Commonly known as: GLYCOLAX/MIRALAX Take 17 g by mouth daily. Replaces: polyethylene glycol 17 g packet   rOPINIRole 1 MG tablet Commonly known as: REQUIP Take 2mg  (two tablets) at 6 (six) AM, Take 2mg  (two tablets) at 2 (two) PM, and Take 3mg  (three tablets) at 10 (ten) PM. What changed: additional instructions   saccharomyces boulardii 250 MG capsule Commonly known as: FLORASTOR Take 1 capsule (250 mg total) by mouth 2 (two) times daily. Purchase over the counter   sodium chloride 0.65 % Soln nasal spray Commonly known as: OCEAN Place 1 spray into both nostrils daily.   spironolactone 25 MG tablet Commonly known as: ALDACTONE Take 1 tablet (25 mg total) by mouth daily.   TAB-A-VITE/BETA CAROTENE PO Take 1 tablet by mouth daily.   traMADol 50 MG tablet--Rx# 35 pills. Commonly known as: ULTRAM Take 1-2 tablets (50-100 mg total) by mouth every 8 (eight) hours. What changed:  how much to take when to take this reasons to take  this   traZODone 50 MG tablet Commonly known as: DESYREL Take 0.5-1 tablets (25-50 mg total) by mouth at bedtime as needed for sleep.        Follow-up Information     Karleen Hampshire., MD Follow up.   Specialty: Internal Medicine Why: call for follow up appointment Contact information: [id number] [place] [address] [phone]         Jennye Boroughs, MD. Call.   Specialty: Physical Medicine and Rehabilitation Why: As needed Contact information: [address] [place] [address] [phone]         Trula Slade, MD. Call in 1 day(s).   Specialty: Cardiology Why: for follow up appointment Contact information: [place] [address] [phone]         Dewaine Oats, MD Follow up.   Specialty: Gastroenterology Why: call for follow up appointment--after GI bleed Contact information: [address] [place] [address] [phone]         Celene Squibb., MD Follow up.   Specialty: Neurology Why: call for follow up appointment and refills on pain medications Contact information: [id number] [place] [address] [phone]                 Signed: Bary Leriche 05/08/2022, 12:27 AM

## 2022-05-08 DIAGNOSIS — M791 Myalgia, unspecified site: Secondary | ICD-10-CM

## 2023-06-04 IMAGING — DX DG CHEST 1V PORT
1 series · 1 of 1 positions shown · non-contrast
Comparison: 04/07/2022

CLINICAL DATA: PICC line occluded

EXAM:
PORTABLE CHEST 1 VIEW

[chest]
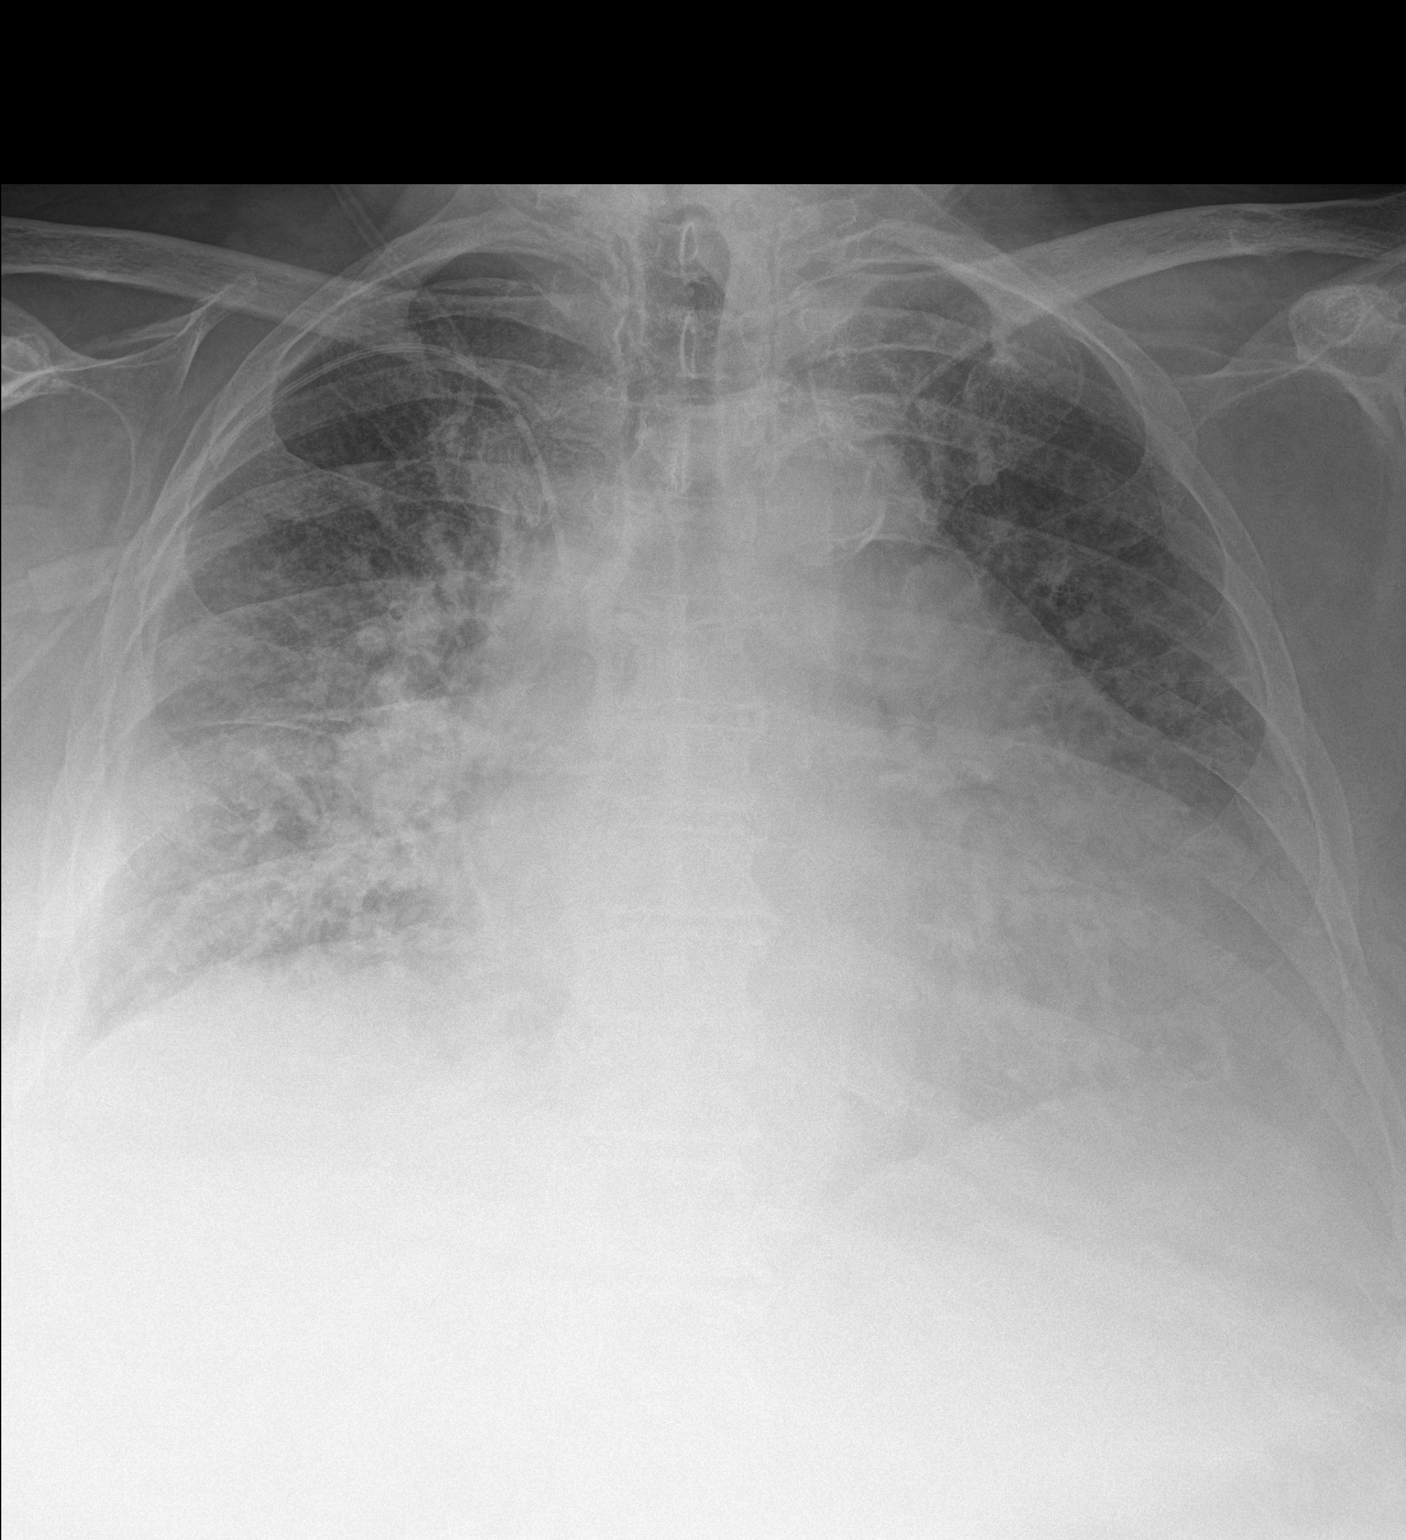

[1 of 1 positions shown; findings below may reference images not displayed]

FINDINGS: Cardiomegaly with mild to moderate interstitial edema. No definite
pleural effusions. No pneumothorax.

Thoracic aortic atherosclerosis.

Right arm PICC terminates in the mid SVC.
IMPRESSION: Cardiomegaly with mild to moderate interstitial edema. No definite
pleural effusions.

Right arm PICC terminates in the mid SVC.

## 2023-06-05 IMAGING — CR DG CHEST 2V
3 series · 3 of 3 positions shown · non-contrast
Comparison: 04/18/2022

CLINICAL DATA: Check PICC placement

EXAM:
CHEST - 2 VIEW

[chest lat (1 of 2)]
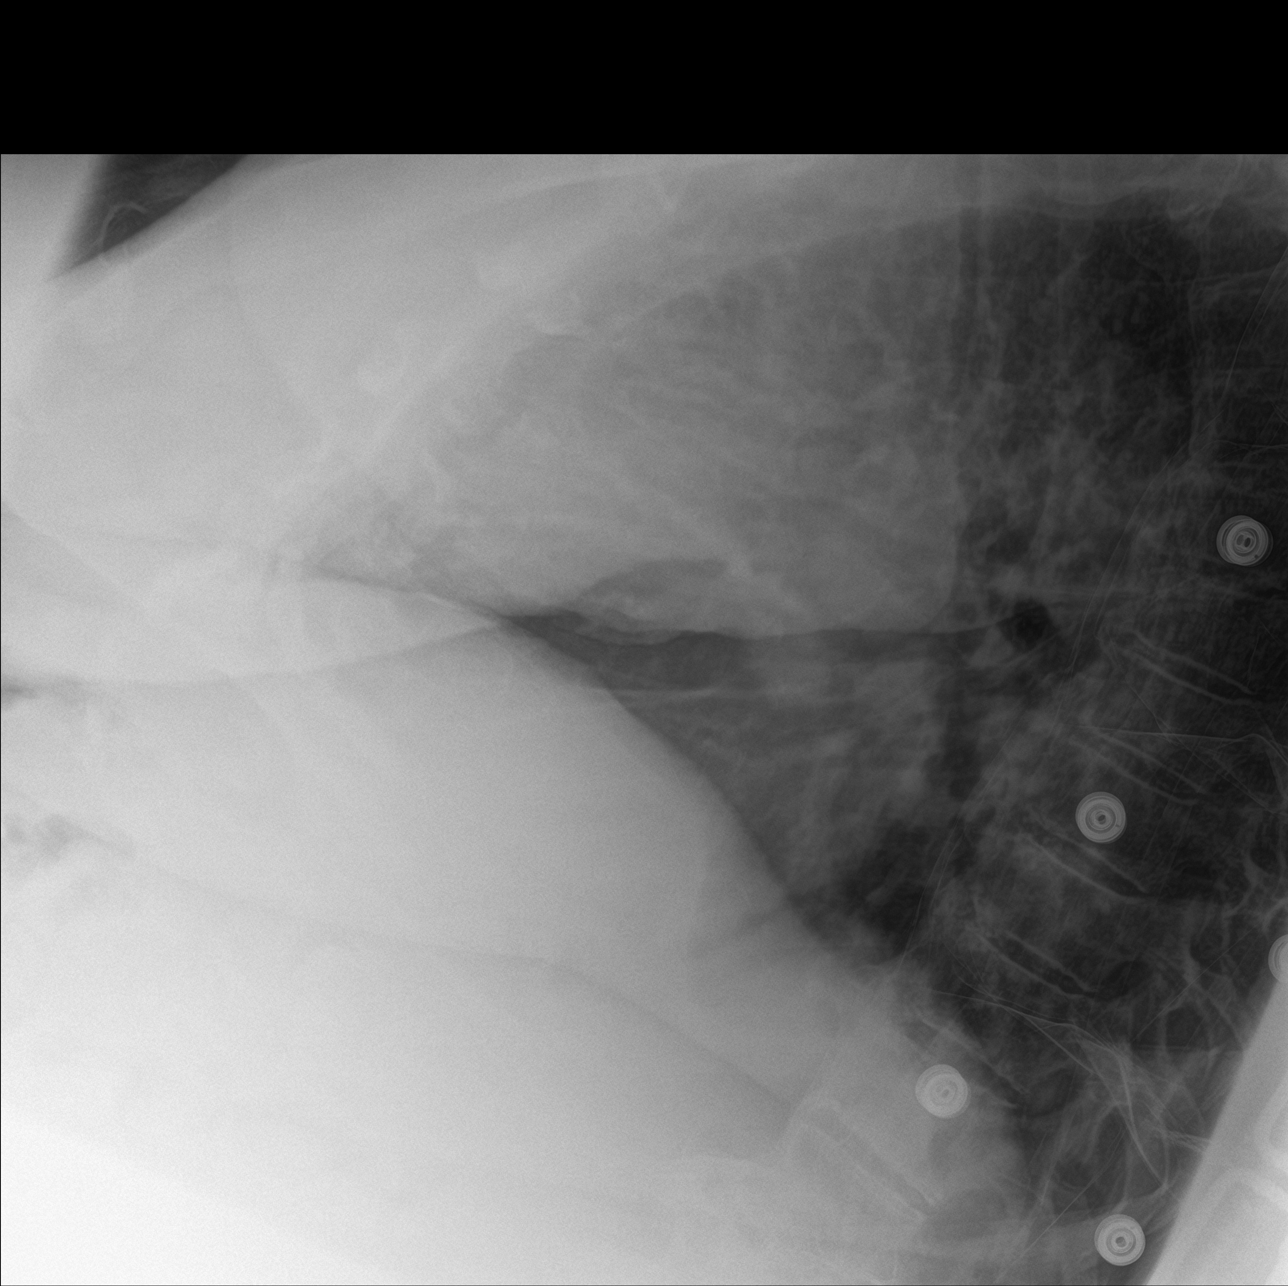

[chest ap]
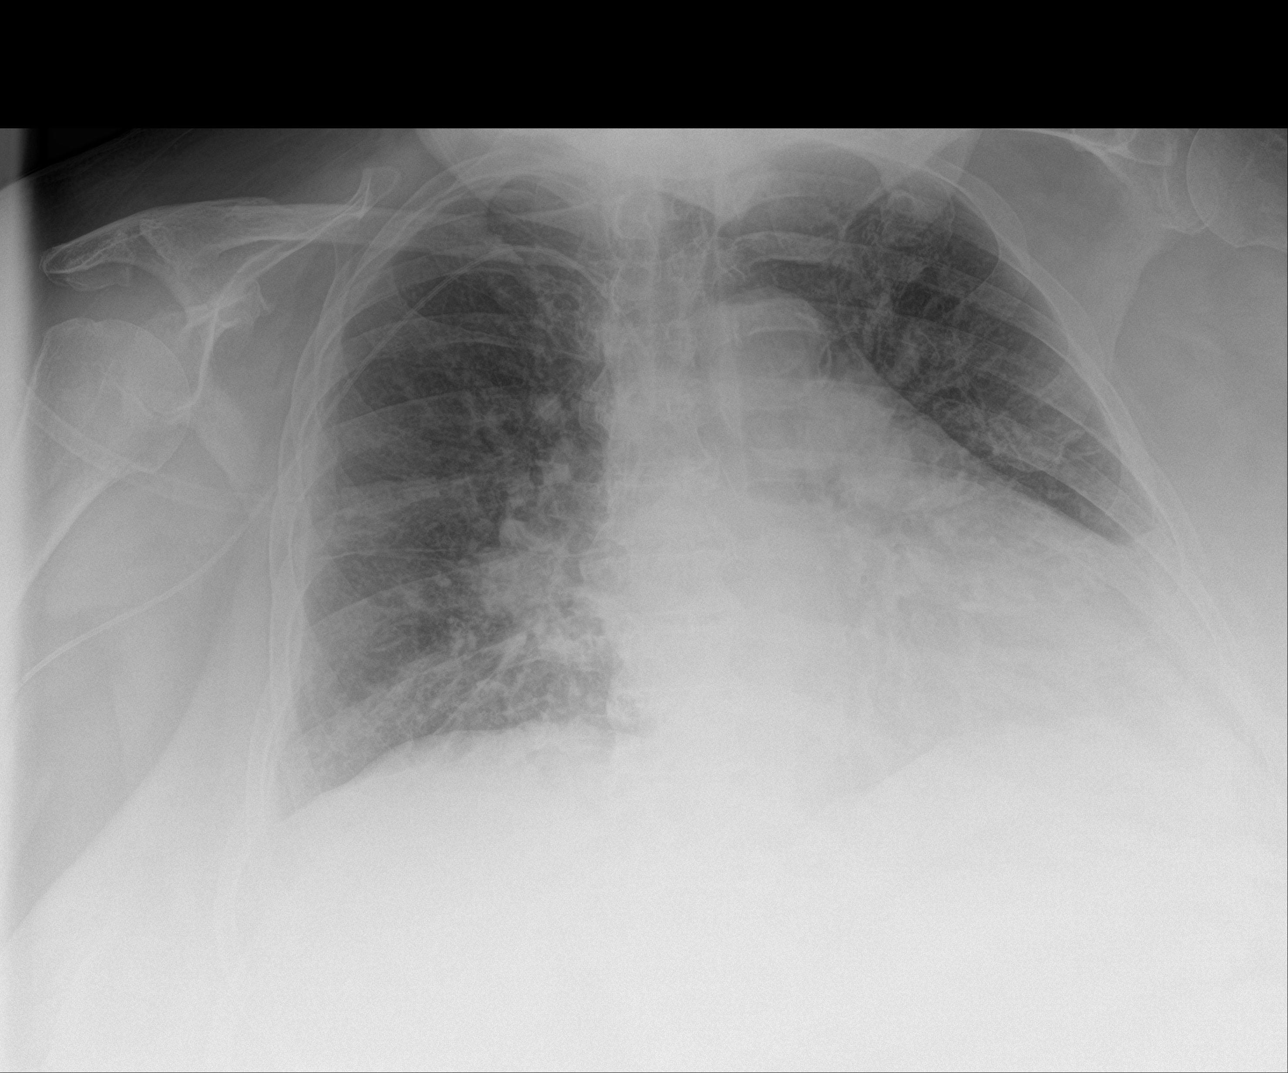

[chest lat (2 of 2)]
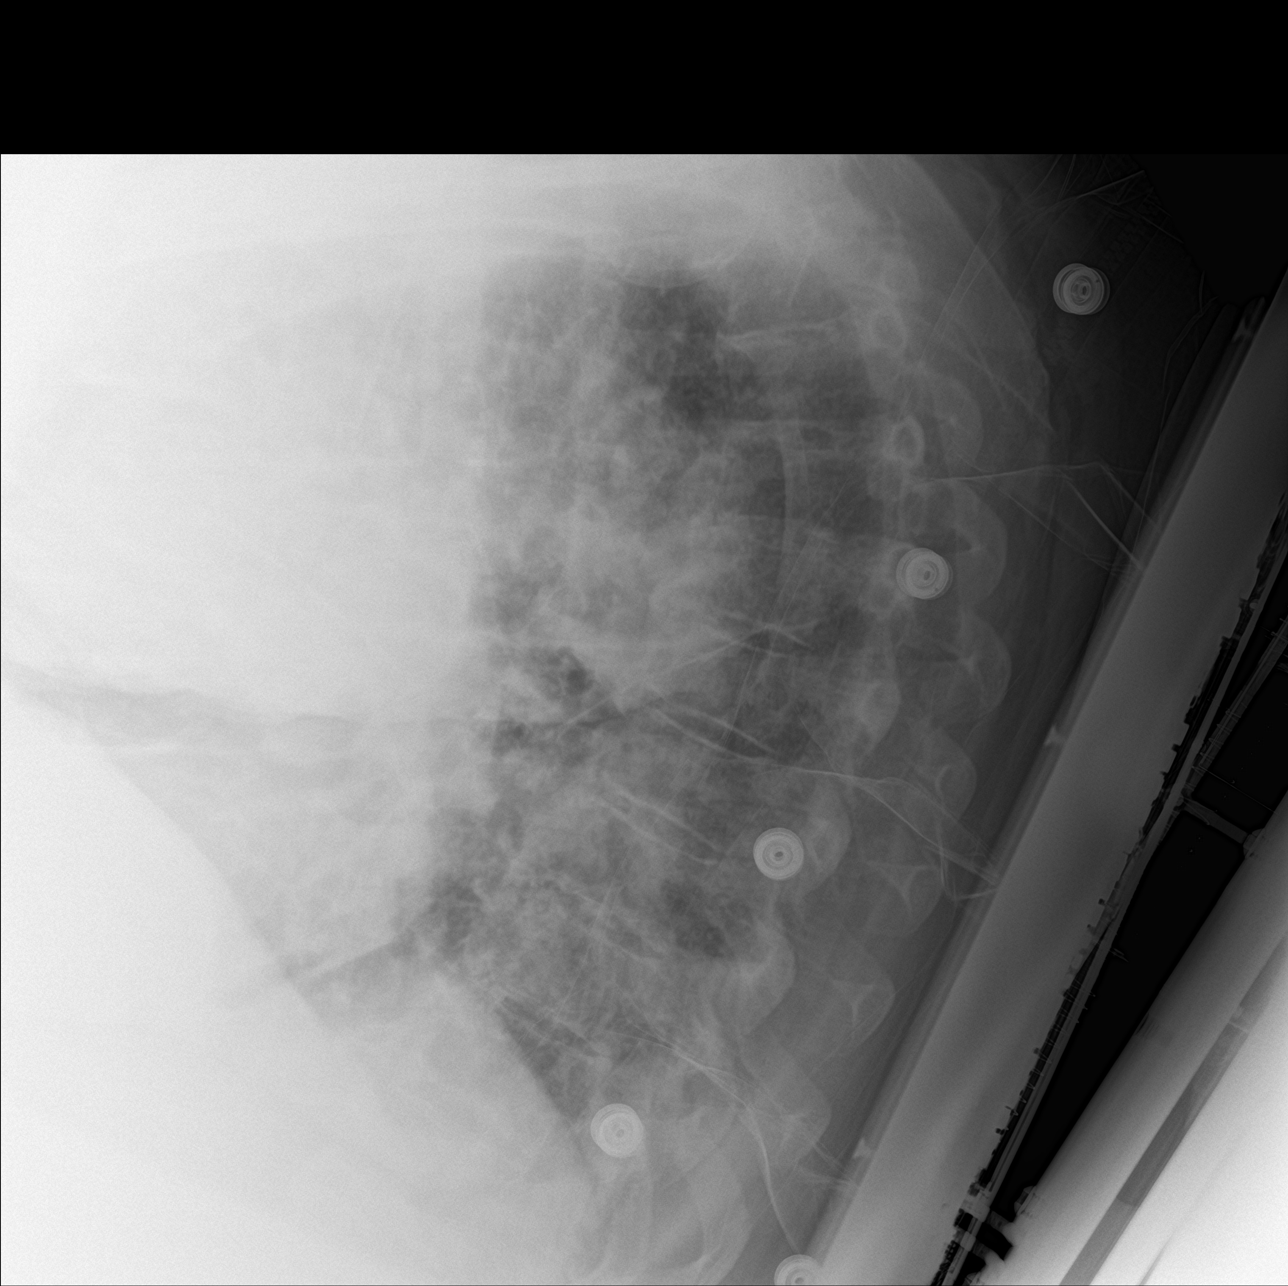

[3 of 3 positions shown; findings below may reference images not displayed]

FINDINGS: Cardiac shadow is prominent but stable. Aortic calcifications are
again seen. Right-sided PICC is noted in the proximal superior vena
cava and may abut the vein wall given its positioning. Lungs are
well aerated with mild vascular congestion and interstitial edema.
IMPRESSION: PICC on the right is seen in the proximal superior vena cava but may
abut the vein wall given its positioning.

## 2024-04-19 DEATH — deceased
